# Patient Record
Sex: Female | Born: 1950 | Race: Black or African American | Hispanic: No | Marital: Single | Smoking: Never smoker
Health system: Southern US, Community
[De-identification: ages and names within clinical notes are randomized; demographics above are authoritative.]

## PROBLEM LIST (undated history)

## (undated) DIAGNOSIS — E0590 Thyrotoxicosis, unspecified without thyrotoxic crisis or storm: Secondary | ICD-10-CM

## (undated) DIAGNOSIS — I10 Essential (primary) hypertension: Secondary | ICD-10-CM

## (undated) DIAGNOSIS — C189 Malignant neoplasm of colon, unspecified: Secondary | ICD-10-CM

## (undated) DIAGNOSIS — D649 Anemia, unspecified: Secondary | ICD-10-CM

## (undated) DIAGNOSIS — Z5189 Encounter for other specified aftercare: Secondary | ICD-10-CM

## (undated) DIAGNOSIS — T8859XA Other complications of anesthesia, initial encounter: Secondary | ICD-10-CM

## (undated) DIAGNOSIS — E079 Disorder of thyroid, unspecified: Secondary | ICD-10-CM

## (undated) DIAGNOSIS — T4145XA Adverse effect of unspecified anesthetic, initial encounter: Secondary | ICD-10-CM

## (undated) HISTORY — DX: Essential (primary) hypertension: I10

## (undated) HISTORY — DX: Anemia, unspecified: D64.9

## (undated) HISTORY — DX: Encounter for other specified aftercare: Z51.89

## (undated) HISTORY — PX: OTHER SURGICAL HISTORY: SHX169

## (undated) HISTORY — PX: COLECTOMY: SHX59

## (undated) HISTORY — DX: Malignant neoplasm of colon, unspecified: C18.9

## (undated) HISTORY — DX: Disorder of thyroid, unspecified: E07.9

---

## 2010-02-12 ENCOUNTER — Ambulatory Visit: Payer: Self-pay | Admitting: Hematology and Oncology

## 2010-02-13 LAB — CBC WITH DIFFERENTIAL/PLATELET
BASO%: 1.1 % (ref 0.0–2.0)
Basophils Absolute: 0 10*3/uL (ref 0.0–0.1)
EOS%: 1 % (ref 0.0–7.0)
Eosinophils Absolute: 0 10*3/uL (ref 0.0–0.5)
HCT: 35.3 % (ref 34.8–46.6)
HGB: 11.7 g/dL (ref 11.6–15.9)
LYMPH%: 32.6 % (ref 14.0–49.7)
MCH: 24.9 pg — ABNORMAL LOW (ref 25.1–34.0)
MCHC: 33 g/dL (ref 31.5–36.0)
MCV: 75.4 fL — ABNORMAL LOW (ref 79.5–101.0)
MONO#: 0.5 10*3/uL (ref 0.1–0.9)
MONO%: 16.9 % — ABNORMAL HIGH (ref 0.0–14.0)
NEUT#: 1.5 10*3/uL (ref 1.5–6.5)
NEUT%: 48.4 % (ref 38.4–76.8)
Platelets: 334 10*3/uL (ref 145–400)
RBC: 4.68 10*6/uL (ref 3.70–5.45)
RDW: 24.5 % — ABNORMAL HIGH (ref 11.2–14.5)
WBC: 3.1 10*3/uL — ABNORMAL LOW (ref 3.9–10.3)
lymph#: 1 10*3/uL (ref 0.9–3.3)

## 2010-02-13 LAB — COMPREHENSIVE METABOLIC PANEL
ALT: 17 U/L (ref 0–35)
AST: 29 U/L (ref 0–37)
Albumin: 4.5 g/dL (ref 3.5–5.2)
Alkaline Phosphatase: 82 U/L (ref 39–117)
BUN: 13 mg/dL (ref 6–23)
CO2: 28 mEq/L (ref 19–32)
Calcium: 9.9 mg/dL (ref 8.4–10.5)
Chloride: 103 mEq/L (ref 96–112)
Creatinine, Ser: 0.52 mg/dL (ref 0.40–1.20)
Glucose, Bld: 78 mg/dL (ref 70–99)
Potassium: 3.9 mEq/L (ref 3.5–5.3)
Sodium: 140 mEq/L (ref 135–145)
Total Bilirubin: 0.4 mg/dL (ref 0.3–1.2)
Total Protein: 7.4 g/dL (ref 6.0–8.3)

## 2010-02-13 LAB — CEA: CEA: 0.6 ng/mL (ref 0.0–5.0)

## 2010-03-04 LAB — COMPREHENSIVE METABOLIC PANEL
ALT: 16 U/L (ref 0–35)
AST: 24 U/L (ref 0–37)
Albumin: 4 g/dL (ref 3.5–5.2)
Alkaline Phosphatase: 113 U/L (ref 39–117)
BUN: 11 mg/dL (ref 6–23)
Chloride: 105 mEq/L (ref 96–112)
Creatinine, Ser: 0.55 mg/dL (ref 0.40–1.20)
Potassium: 3.8 mEq/L (ref 3.5–5.3)

## 2010-03-04 LAB — CBC WITH DIFFERENTIAL/PLATELET
BASO%: 0.5 % (ref 0.0–2.0)
Basophils Absolute: 0 10*3/uL (ref 0.0–0.1)
EOS%: 1 % (ref 0.0–7.0)
HGB: 11.3 g/dL — ABNORMAL LOW (ref 11.6–15.9)
MCH: 25.9 pg (ref 25.1–34.0)
MCHC: 32.9 g/dL (ref 31.5–36.0)
MCV: 78.5 fL — ABNORMAL LOW (ref 79.5–101.0)
MONO%: 7.6 % (ref 0.0–14.0)
RDW: 23 % — ABNORMAL HIGH (ref 11.2–14.5)
lymph#: 1.4 10*3/uL (ref 0.9–3.3)

## 2010-03-16 ENCOUNTER — Ambulatory Visit: Payer: Self-pay | Admitting: Hematology and Oncology

## 2010-03-18 LAB — CBC WITH DIFFERENTIAL/PLATELET
BASO%: 1.4 % (ref 0.0–2.0)
Basophils Absolute: 0.1 10*3/uL (ref 0.0–0.1)
EOS%: 1.2 % (ref 0.0–7.0)
Eosinophils Absolute: 0.1 10*3/uL (ref 0.0–0.5)
HCT: 35.9 % (ref 34.8–46.6)
HGB: 11.9 g/dL (ref 11.6–15.9)
LYMPH%: 16.4 % (ref 14.0–49.7)
MCH: 26.8 pg (ref 25.1–34.0)
MCHC: 33.2 g/dL (ref 31.5–36.0)
MCV: 80.7 fL (ref 79.5–101.0)
MONO#: 0.7 10*3/uL (ref 0.1–0.9)
MONO%: 8.2 % (ref 0.0–14.0)
NEUT#: 6.2 10*3/uL (ref 1.5–6.5)
NEUT%: 72.8 % (ref 38.4–76.8)
Platelets: 281 10*3/uL (ref 145–400)
RBC: 4.45 10*6/uL (ref 3.70–5.45)
RDW: 22.9 % — ABNORMAL HIGH (ref 11.2–14.5)
WBC: 8.5 10*3/uL (ref 3.9–10.3)
lymph#: 1.4 10*3/uL (ref 0.9–3.3)

## 2010-03-18 LAB — COMPREHENSIVE METABOLIC PANEL
ALT: 19 U/L (ref 0–35)
AST: 27 U/L (ref 0–37)
Albumin: 4.2 g/dL (ref 3.5–5.2)
Alkaline Phosphatase: 165 U/L — ABNORMAL HIGH (ref 39–117)
BUN: 10 mg/dL (ref 6–23)
CO2: 28 mEq/L (ref 19–32)
Calcium: 9.7 mg/dL (ref 8.4–10.5)
Chloride: 99 mEq/L (ref 96–112)
Creatinine, Ser: 0.58 mg/dL (ref 0.40–1.20)
Glucose, Bld: 79 mg/dL (ref 70–99)
Potassium: 4 mEq/L (ref 3.5–5.3)
Sodium: 134 mEq/L — ABNORMAL LOW (ref 135–145)
Total Bilirubin: 0.3 mg/dL (ref 0.3–1.2)
Total Protein: 6.8 g/dL (ref 6.0–8.3)

## 2010-04-15 ENCOUNTER — Ambulatory Visit: Payer: Self-pay | Admitting: Hematology and Oncology

## 2010-04-16 LAB — COMPREHENSIVE METABOLIC PANEL
AST: 31 U/L (ref 0–37)
Alkaline Phosphatase: 169 U/L — ABNORMAL HIGH (ref 39–117)
BUN: 20 mg/dL (ref 6–23)
Calcium: 9.2 mg/dL (ref 8.4–10.5)
Creatinine, Ser: 0.54 mg/dL (ref 0.40–1.20)

## 2010-04-16 LAB — CBC WITH DIFFERENTIAL/PLATELET
Basophils Absolute: 0 10*3/uL (ref 0.0–0.1)
EOS%: 1.3 % (ref 0.0–7.0)
HCT: 34 % — ABNORMAL LOW (ref 34.8–46.6)
HGB: 11.3 g/dL — ABNORMAL LOW (ref 11.6–15.9)
MCH: 27.6 pg (ref 25.1–34.0)
MCV: 82.8 fL (ref 79.5–101.0)
MONO%: 11.8 % (ref 0.0–14.0)
NEUT%: 69.1 % (ref 38.4–76.8)

## 2010-04-30 LAB — COMPREHENSIVE METABOLIC PANEL
AST: 30 U/L (ref 0–37)
BUN: 13 mg/dL (ref 6–23)
Calcium: 9.1 mg/dL (ref 8.4–10.5)
Chloride: 102 mEq/L (ref 96–112)
Creatinine, Ser: 0.63 mg/dL (ref 0.40–1.20)
Glucose, Bld: 66 mg/dL — ABNORMAL LOW (ref 70–99)

## 2010-04-30 LAB — CBC WITH DIFFERENTIAL/PLATELET
Basophils Absolute: 0 10*3/uL (ref 0.0–0.1)
EOS%: 1.6 % (ref 0.0–7.0)
Eosinophils Absolute: 0.1 10*3/uL (ref 0.0–0.5)
HCT: 36.1 % (ref 34.8–46.6)
HGB: 11.9 g/dL (ref 11.6–15.9)
MCH: 27.3 pg (ref 25.1–34.0)
MCV: 82.8 fL (ref 79.5–101.0)
NEUT%: 65.9 % (ref 38.4–76.8)
lymph#: 1.9 10*3/uL (ref 0.9–3.3)

## 2010-05-13 LAB — CBC WITH DIFFERENTIAL/PLATELET
Basophils Absolute: 0.1 10*3/uL (ref 0.0–0.1)
Eosinophils Absolute: 0.1 10*3/uL (ref 0.0–0.5)
HCT: 32.6 % — ABNORMAL LOW (ref 34.8–46.6)
HGB: 11 g/dL — ABNORMAL LOW (ref 11.6–15.9)
MONO#: 1 10*3/uL — ABNORMAL HIGH (ref 0.1–0.9)
NEUT#: 8.1 10*3/uL — ABNORMAL HIGH (ref 1.5–6.5)
NEUT%: 73.7 % (ref 38.4–76.8)
RDW: 22.3 % — ABNORMAL HIGH (ref 11.2–14.5)
WBC: 11 10*3/uL — ABNORMAL HIGH (ref 3.9–10.3)
lymph#: 1.7 10*3/uL (ref 0.9–3.3)

## 2010-05-13 LAB — COMPREHENSIVE METABOLIC PANEL
Albumin: 4.1 g/dL (ref 3.5–5.2)
BUN: 10 mg/dL (ref 6–23)
CO2: 26 mEq/L (ref 19–32)
Calcium: 9.3 mg/dL (ref 8.4–10.5)
Chloride: 102 mEq/L (ref 96–112)
Creatinine, Ser: 0.54 mg/dL (ref 0.40–1.20)
Potassium: 3.7 mEq/L (ref 3.5–5.3)

## 2010-05-15 ENCOUNTER — Ambulatory Visit: Payer: Self-pay | Admitting: Hematology and Oncology

## 2010-05-25 LAB — COMPREHENSIVE METABOLIC PANEL
AST: 31 U/L (ref 0–37)
Alkaline Phosphatase: 105 U/L (ref 39–117)
BUN: 13 mg/dL (ref 6–23)
Creatinine, Ser: 0.53 mg/dL (ref 0.40–1.20)
Total Bilirubin: 0.3 mg/dL (ref 0.3–1.2)

## 2010-05-25 LAB — CBC WITH DIFFERENTIAL/PLATELET
BASO%: 0.9 % (ref 0.0–2.0)
Basophils Absolute: 0 10*3/uL (ref 0.0–0.1)
EOS%: 0.7 % (ref 0.0–7.0)
HCT: 32 % — ABNORMAL LOW (ref 34.8–46.6)
HGB: 10.9 g/dL — ABNORMAL LOW (ref 11.6–15.9)
MCH: 29.1 pg (ref 25.1–34.0)
MCHC: 34.1 g/dL (ref 31.5–36.0)
MCV: 85.4 fL (ref 79.5–101.0)
MONO%: 11 % (ref 0.0–14.0)
NEUT%: 55 % (ref 38.4–76.8)

## 2010-06-03 LAB — CBC WITH DIFFERENTIAL/PLATELET
Basophils Absolute: 0 10*3/uL (ref 0.0–0.1)
EOS%: 0.9 % (ref 0.0–7.0)
HCT: 35.4 % (ref 34.8–46.6)
HGB: 11.9 g/dL (ref 11.6–15.9)
LYMPH%: 11.4 % — ABNORMAL LOW (ref 14.0–49.7)
MCH: 28.7 pg (ref 25.1–34.0)
MCV: 85.7 fL (ref 79.5–101.0)
MONO%: 8.8 % (ref 0.0–14.0)
NEUT%: 78.6 % — ABNORMAL HIGH (ref 38.4–76.8)
Platelets: 114 10*3/uL — ABNORMAL LOW (ref 145–400)
lymph#: 1.2 10*3/uL (ref 0.9–3.3)

## 2010-06-03 LAB — COMPREHENSIVE METABOLIC PANEL
AST: 35 U/L (ref 0–37)
BUN: 6 mg/dL (ref 6–23)
Calcium: 9.1 mg/dL (ref 8.4–10.5)
Chloride: 104 mEq/L (ref 96–112)
Creatinine, Ser: 0.56 mg/dL (ref 0.40–1.20)

## 2010-06-11 LAB — CBC WITH DIFFERENTIAL/PLATELET
Basophils Absolute: 0.1 10*3/uL (ref 0.0–0.1)
HCT: 36.4 % (ref 34.8–46.6)
HGB: 12.4 g/dL (ref 11.6–15.9)
MCH: 29.4 pg (ref 25.1–34.0)
MONO#: 0.5 10*3/uL (ref 0.1–0.9)
NEUT%: 70.9 % (ref 38.4–76.8)
WBC: 8.7 10*3/uL (ref 3.9–10.3)
lymph#: 1.8 10*3/uL (ref 0.9–3.3)

## 2010-06-22 ENCOUNTER — Ambulatory Visit: Payer: Self-pay | Admitting: Hematology and Oncology

## 2010-06-24 LAB — CBC WITH DIFFERENTIAL/PLATELET
BASO%: 1.5 % (ref 0.0–2.0)
Basophils Absolute: 0.1 10*3/uL (ref 0.0–0.1)
EOS%: 1.3 % (ref 0.0–7.0)
Eosinophils Absolute: 0.1 10*3/uL (ref 0.0–0.5)
HCT: 35.9 % (ref 34.8–46.6)
HGB: 11.8 g/dL (ref 11.6–15.9)
LYMPH%: 33.6 % (ref 14.0–49.7)
MCH: 28.4 pg (ref 25.1–34.0)
MCHC: 33 g/dL (ref 31.5–36.0)
MCV: 86.2 fL (ref 79.5–101.0)
MONO#: 0.7 10*3/uL (ref 0.1–0.9)
MONO%: 16.3 % — ABNORMAL HIGH (ref 0.0–14.0)
NEUT#: 1.9 10*3/uL (ref 1.5–6.5)
NEUT%: 47.3 % (ref 38.4–76.8)
Platelets: 224 10*3/uL (ref 145–400)
RBC: 4.17 10*6/uL (ref 3.70–5.45)
RDW: 20.3 % — ABNORMAL HIGH (ref 11.2–14.5)
WBC: 4 10*3/uL (ref 3.9–10.3)
lymph#: 1.3 10*3/uL (ref 0.9–3.3)

## 2010-06-24 LAB — COMPREHENSIVE METABOLIC PANEL
ALT: 20 U/L (ref 0–35)
AST: 37 U/L (ref 0–37)
Albumin: 4.2 g/dL (ref 3.5–5.2)
Alkaline Phosphatase: 110 U/L (ref 39–117)
BUN: 11 mg/dL (ref 6–23)
CO2: 28 mEq/L (ref 19–32)
Calcium: 9.5 mg/dL (ref 8.4–10.5)
Chloride: 102 mEq/L (ref 96–112)
Creatinine, Ser: 0.58 mg/dL (ref 0.40–1.20)
Glucose, Bld: 82 mg/dL (ref 70–99)
Potassium: 4.8 mEq/L (ref 3.5–5.3)
Sodium: 138 mEq/L (ref 135–145)
Total Bilirubin: 0.3 mg/dL (ref 0.3–1.2)
Total Protein: 7.7 g/dL (ref 6.0–8.3)

## 2010-07-03 LAB — COMPREHENSIVE METABOLIC PANEL
ALT: 27 U/L (ref 0–35)
AST: 38 U/L — ABNORMAL HIGH (ref 0–37)
Albumin: 4.6 g/dL (ref 3.5–5.2)
Alkaline Phosphatase: 224 U/L — ABNORMAL HIGH (ref 39–117)
BUN: 10 mg/dL (ref 6–23)
CO2: 28 mEq/L (ref 19–32)
Calcium: 9.7 mg/dL (ref 8.4–10.5)
Chloride: 99 mEq/L (ref 96–112)
Creatinine, Ser: 0.53 mg/dL (ref 0.40–1.20)
Glucose, Bld: 86 mg/dL (ref 70–99)
Potassium: 3.2 mEq/L — ABNORMAL LOW (ref 3.5–5.3)
Sodium: 139 mEq/L (ref 135–145)
Total Bilirubin: 0.4 mg/dL (ref 0.3–1.2)
Total Protein: 8.1 g/dL (ref 6.0–8.3)

## 2010-07-03 LAB — CBC WITH DIFFERENTIAL/PLATELET
BASO%: 0.3 % (ref 0.0–2.0)
Basophils Absolute: 0 10*3/uL (ref 0.0–0.1)
EOS%: 1 % (ref 0.0–7.0)
Eosinophils Absolute: 0.1 10*3/uL (ref 0.0–0.5)
HCT: 38.4 % (ref 34.8–46.6)
HGB: 12.7 g/dL (ref 11.6–15.9)
LYMPH%: 15.5 % (ref 14.0–49.7)
MCH: 28.7 pg (ref 25.1–34.0)
MCHC: 33.1 g/dL (ref 31.5–36.0)
MCV: 86.8 fL (ref 79.5–101.0)
MONO#: 1.1 10*3/uL — ABNORMAL HIGH (ref 0.1–0.9)
MONO%: 7.8 % (ref 0.0–14.0)
NEUT#: 10.2 10*3/uL — ABNORMAL HIGH (ref 1.5–6.5)
NEUT%: 75.4 % (ref 38.4–76.8)
Platelets: 142 10*3/uL — ABNORMAL LOW (ref 145–400)
RBC: 4.42 10*6/uL (ref 3.70–5.45)
RDW: 19.9 % — ABNORMAL HIGH (ref 11.2–14.5)
WBC: 13.6 10*3/uL — ABNORMAL HIGH (ref 3.9–10.3)
lymph#: 2.1 10*3/uL (ref 0.9–3.3)

## 2010-07-03 LAB — CEA: CEA: 0.7 ng/mL (ref 0.0–5.0)

## 2010-07-06 ENCOUNTER — Ambulatory Visit (HOSPITAL_COMMUNITY)
Admission: RE | Admit: 2010-07-06 | Discharge: 2010-07-06 | Payer: Self-pay | Source: Home / Self Care | Attending: Hematology and Oncology | Admitting: Hematology and Oncology

## 2010-07-24 LAB — HM COLONOSCOPY: HM Colonoscopy: NORMAL

## 2010-08-06 ENCOUNTER — Encounter (HOSPITAL_BASED_OUTPATIENT_CLINIC_OR_DEPARTMENT_OTHER): Payer: Medicaid Other | Admitting: Hematology and Oncology

## 2010-08-06 DIAGNOSIS — C182 Malignant neoplasm of ascending colon: Secondary | ICD-10-CM

## 2010-08-06 DIAGNOSIS — Z452 Encounter for adjustment and management of vascular access device: Secondary | ICD-10-CM

## 2010-08-19 ENCOUNTER — Other Ambulatory Visit: Payer: Self-pay | Admitting: Emergency Medicine

## 2010-08-19 DIAGNOSIS — R102 Pelvic and perineal pain: Secondary | ICD-10-CM

## 2010-08-25 ENCOUNTER — Ambulatory Visit
Admission: RE | Admit: 2010-08-25 | Discharge: 2010-08-25 | Disposition: A | Discharge status: Home / Self Care | Payer: Medicare Other | Source: Ambulatory Visit | Attending: Emergency Medicine | Admitting: Emergency Medicine

## 2010-08-25 DIAGNOSIS — R102 Pelvic and perineal pain: Secondary | ICD-10-CM

## 2010-09-17 ENCOUNTER — Encounter (HOSPITAL_BASED_OUTPATIENT_CLINIC_OR_DEPARTMENT_OTHER): Payer: Medicare Other | Admitting: Hematology and Oncology

## 2010-09-17 DIAGNOSIS — Z452 Encounter for adjustment and management of vascular access device: Secondary | ICD-10-CM

## 2010-09-17 DIAGNOSIS — C182 Malignant neoplasm of ascending colon: Secondary | ICD-10-CM

## 2010-11-02 ENCOUNTER — Encounter (HOSPITAL_BASED_OUTPATIENT_CLINIC_OR_DEPARTMENT_OTHER): Payer: Medicare Other | Admitting: Hematology and Oncology

## 2010-11-02 ENCOUNTER — Other Ambulatory Visit: Payer: Self-pay | Admitting: Hematology and Oncology

## 2010-11-02 DIAGNOSIS — Z5111 Encounter for antineoplastic chemotherapy: Secondary | ICD-10-CM

## 2010-11-02 DIAGNOSIS — C182 Malignant neoplasm of ascending colon: Secondary | ICD-10-CM

## 2010-11-02 DIAGNOSIS — I10 Essential (primary) hypertension: Secondary | ICD-10-CM

## 2010-11-02 LAB — COMPREHENSIVE METABOLIC PANEL
ALT: 47 U/L — ABNORMAL HIGH (ref 0–35)
AST: 56 U/L — ABNORMAL HIGH (ref 0–37)
Albumin: 4.1 g/dL (ref 3.5–5.2)
CO2: 28 mEq/L (ref 19–32)
Calcium: 9.5 mg/dL (ref 8.4–10.5)
Chloride: 100 mEq/L (ref 96–112)
Creatinine, Ser: 0.69 mg/dL (ref 0.40–1.20)
Potassium: 4.2 mEq/L (ref 3.5–5.3)

## 2010-11-02 LAB — CBC WITH DIFFERENTIAL/PLATELET
BASO%: 0.6 % (ref 0.0–2.0)
Basophils Absolute: 0 10*3/uL (ref 0.0–0.1)
EOS%: 2.4 % (ref 0.0–7.0)
HCT: 36.5 % (ref 34.8–46.6)
HGB: 12.2 g/dL (ref 11.6–15.9)
MCH: 28.1 pg (ref 25.1–34.0)
MCHC: 33.5 g/dL (ref 31.5–36.0)
MONO#: 0.2 10*3/uL (ref 0.1–0.9)
NEUT%: 58.2 % (ref 38.4–76.8)
RDW: 14.7 % — ABNORMAL HIGH (ref 11.2–14.5)
WBC: 4 10*3/uL (ref 3.9–10.3)
lymph#: 1.3 10*3/uL (ref 0.9–3.3)

## 2010-11-02 LAB — CEA: CEA: <0.5 ng/mL (ref 0.0–5.0)

## 2010-11-05 ENCOUNTER — Encounter (HOSPITAL_BASED_OUTPATIENT_CLINIC_OR_DEPARTMENT_OTHER): Payer: Medicare Other | Admitting: Hematology and Oncology

## 2010-11-05 DIAGNOSIS — C182 Malignant neoplasm of ascending colon: Secondary | ICD-10-CM

## 2011-01-08 ENCOUNTER — Encounter (HOSPITAL_BASED_OUTPATIENT_CLINIC_OR_DEPARTMENT_OTHER): Payer: Medicare Other | Admitting: Hematology and Oncology

## 2011-01-08 DIAGNOSIS — Z452 Encounter for adjustment and management of vascular access device: Secondary | ICD-10-CM

## 2011-01-08 DIAGNOSIS — C182 Malignant neoplasm of ascending colon: Secondary | ICD-10-CM

## 2011-03-05 ENCOUNTER — Other Ambulatory Visit (HOSPITAL_COMMUNITY): Payer: Self-pay | Admitting: Oncology

## 2011-03-05 ENCOUNTER — Encounter (HOSPITAL_BASED_OUTPATIENT_CLINIC_OR_DEPARTMENT_OTHER): Payer: Medicare Other | Admitting: Hematology and Oncology

## 2011-03-05 DIAGNOSIS — C189 Malignant neoplasm of colon, unspecified: Secondary | ICD-10-CM

## 2011-03-05 DIAGNOSIS — C182 Malignant neoplasm of ascending colon: Secondary | ICD-10-CM

## 2011-03-05 DIAGNOSIS — Z452 Encounter for adjustment and management of vascular access device: Secondary | ICD-10-CM

## 2011-03-05 LAB — CBC WITH DIFFERENTIAL/PLATELET
BASO%: 0.3 % (ref 0.0–2.0)
Basophils Absolute: 0 10*3/uL (ref 0.0–0.1)
EOS%: 1 % (ref 0.0–7.0)
MCH: 28.7 pg (ref 25.1–34.0)
MCHC: 33.8 g/dL (ref 31.5–36.0)
MCV: 84.8 fL (ref 79.5–101.0)
MONO%: 6.2 % (ref 0.0–14.0)
RBC: 4.5 10*6/uL (ref 3.70–5.45)
RDW: 14.4 % (ref 11.2–14.5)
lymph#: 1.5 10*3/uL (ref 0.9–3.3)

## 2011-03-05 LAB — COMPREHENSIVE METABOLIC PANEL
ALT: 15 U/L (ref 0–35)
AST: 28 U/L (ref 0–37)
Albumin: 4.4 g/dL (ref 3.5–5.2)
Alkaline Phosphatase: 67 U/L (ref 39–117)
BUN: 11 mg/dL (ref 6–23)
Calcium: 9.1 mg/dL (ref 8.4–10.5)
Chloride: 102 mEq/L (ref 96–112)
Potassium: 3.5 mEq/L (ref 3.5–5.3)
Sodium: 142 mEq/L (ref 135–145)

## 2011-03-09 ENCOUNTER — Encounter (HOSPITAL_BASED_OUTPATIENT_CLINIC_OR_DEPARTMENT_OTHER): Payer: Medicare Other | Admitting: Hematology and Oncology

## 2011-03-09 DIAGNOSIS — Z9889 Other specified postprocedural states: Secondary | ICD-10-CM

## 2011-03-09 DIAGNOSIS — Z85038 Personal history of other malignant neoplasm of large intestine: Secondary | ICD-10-CM

## 2011-05-04 ENCOUNTER — Ambulatory Visit (HOSPITAL_BASED_OUTPATIENT_CLINIC_OR_DEPARTMENT_OTHER): Payer: Medicare Other

## 2011-05-04 DIAGNOSIS — C189 Malignant neoplasm of colon, unspecified: Secondary | ICD-10-CM

## 2011-05-04 DIAGNOSIS — Z452 Encounter for adjustment and management of vascular access device: Secondary | ICD-10-CM

## 2011-05-04 MED ORDER — HEPARIN SOD (PORK) LOCK FLUSH 100 UNIT/ML IV SOLN
500.0000 [IU] | Freq: Once | INTRAVENOUS | Status: AC
  Administered 2011-05-04: 500 [IU] via INTRAVENOUS
  Filled 2011-05-04: qty 5

## 2011-05-04 MED ORDER — SODIUM CHLORIDE 0.9 % IJ SOLN
10.0000 mL | INTRAMUSCULAR | Status: DC | PRN
  Administered 2011-05-04: 10 mL via INTRAVENOUS
  Filled 2011-05-04: qty 10

## 2011-06-12 ENCOUNTER — Telehealth: Payer: Self-pay | Admitting: Hematology and Oncology

## 2011-06-12 NOTE — Telephone Encounter (Signed)
S/w the pt and she is aware of her jan,feb 2013 appts

## 2011-06-21 ENCOUNTER — Telehealth: Payer: Self-pay | Admitting: Hematology and Oncology

## 2011-06-21 ENCOUNTER — Other Ambulatory Visit: Payer: Self-pay | Admitting: Hematology and Oncology

## 2011-06-21 DIAGNOSIS — C189 Malignant neoplasm of colon, unspecified: Secondary | ICD-10-CM

## 2011-06-21 NOTE — Telephone Encounter (Signed)
lmonvm adviisng the pt of her ct scan appt on 07/15/2011 along with the lab with instructions. Pt aware to pick up the oral contrast.

## 2011-07-12 ENCOUNTER — Encounter: Payer: Self-pay | Admitting: Nurse Practitioner

## 2011-07-12 ENCOUNTER — Telehealth: Payer: Self-pay | Admitting: Hematology and Oncology

## 2011-07-12 ENCOUNTER — Other Ambulatory Visit: Payer: Self-pay | Admitting: Nurse Practitioner

## 2011-07-12 ENCOUNTER — Telehealth: Payer: Self-pay | Admitting: Nurse Practitioner

## 2011-07-12 DIAGNOSIS — C189 Malignant neoplasm of colon, unspecified: Secondary | ICD-10-CM

## 2011-07-12 NOTE — Telephone Encounter (Signed)
Spoke with patient she will be coming in to speak with me on 07/13/2011 around 10 or 10:30

## 2011-07-12 NOTE — Telephone Encounter (Signed)
1. Pt called inquiring about contrast for CT scan on 1/31.  Instructed pt to pick up contrast from our scheduling department.  2. Pt also stated someone had called her about "filling out paperwork because I am on the Medicaid needy plan".  Noted no phone notes from this office however, informed patient I would forward her name to our financial advocates for assistance.  3. Pt requested portacath flush appt same day as lab and CT scan.  I offered to patient that she could have her port flushed when she saw MD on 07/20/11, however, she was insistent that she did not want to wait.  Portacath flush appt request forwarded to scheduling.

## 2011-07-13 ENCOUNTER — Telehealth: Payer: Self-pay | Admitting: Hematology and Oncology

## 2011-07-13 NOTE — Telephone Encounter (Signed)
Pt came and gave her calendar appt and oral contrast,NPO 4 hrs prior to scan

## 2011-07-15 ENCOUNTER — Ambulatory Visit (HOSPITAL_BASED_OUTPATIENT_CLINIC_OR_DEPARTMENT_OTHER): Payer: Medicare Other

## 2011-07-15 ENCOUNTER — Other Ambulatory Visit (HOSPITAL_BASED_OUTPATIENT_CLINIC_OR_DEPARTMENT_OTHER): Payer: Medicare Other | Admitting: Lab

## 2011-07-15 ENCOUNTER — Ambulatory Visit (HOSPITAL_COMMUNITY)
Admission: RE | Admit: 2011-07-15 | Discharge: 2011-07-15 | Disposition: A | Discharge status: Home / Self Care | Payer: Medicare Other | Source: Ambulatory Visit | Attending: Hematology and Oncology | Admitting: Hematology and Oncology

## 2011-07-15 ENCOUNTER — Encounter (HOSPITAL_COMMUNITY): Payer: Self-pay

## 2011-07-15 VITALS — BP 171/102 | HR 51 | Temp 97.1°F

## 2011-07-15 DIAGNOSIS — C189 Malignant neoplasm of colon, unspecified: Secondary | ICD-10-CM | POA: Diagnosis not present

## 2011-07-15 DIAGNOSIS — N838 Other noninflammatory disorders of ovary, fallopian tube and broad ligament: Secondary | ICD-10-CM | POA: Insufficient documentation

## 2011-07-15 DIAGNOSIS — R911 Solitary pulmonary nodule: Secondary | ICD-10-CM | POA: Insufficient documentation

## 2011-07-15 DIAGNOSIS — Z469 Encounter for fitting and adjustment of unspecified device: Secondary | ICD-10-CM

## 2011-07-15 DIAGNOSIS — E049 Nontoxic goiter, unspecified: Secondary | ICD-10-CM | POA: Diagnosis not present

## 2011-07-15 LAB — CMP (CANCER CENTER ONLY)
ALT(SGPT): 20 U/L (ref 10–47)
Albumin: 3.4 g/dL (ref 3.3–5.5)
CO2: 31 mEq/L (ref 18–33)
Chloride: 97 mEq/L — ABNORMAL LOW (ref 98–108)
Glucose, Bld: 96 mg/dL (ref 73–118)
Potassium: 3.8 mEq/L (ref 3.3–4.7)
Sodium: 141 mEq/L (ref 128–145)
Total Protein: 7.9 g/dL (ref 6.4–8.1)

## 2011-07-15 LAB — CBC WITH DIFFERENTIAL/PLATELET
Basophils Absolute: 0 10*3/uL (ref 0.0–0.1)
EOS%: 1.8 % (ref 0.0–7.0)
Eosinophils Absolute: 0.1 10*3/uL (ref 0.0–0.5)
LYMPH%: 35.7 % (ref 14.0–49.7)
MCH: 28.6 pg (ref 25.1–34.0)
MCV: 85.2 fL (ref 79.5–101.0)
MONO%: 7.2 % (ref 0.0–14.0)
NEUT#: 2.4 10*3/uL (ref 1.5–6.5)
Platelets: 266 10*3/uL (ref 145–400)
RBC: 4.59 10*6/uL (ref 3.70–5.45)

## 2011-07-15 LAB — CEA: CEA: 0.6 ng/mL (ref 0.0–5.0)

## 2011-07-15 MED ORDER — IOHEXOL 300 MG/ML  SOLN
100.0000 mL | Freq: Once | INTRAMUSCULAR | Status: AC | PRN
  Administered 2011-07-15: 100 mL via INTRAVENOUS

## 2011-07-15 MED ORDER — HEPARIN SOD (PORK) LOCK FLUSH 100 UNIT/ML IV SOLN
500.0000 [IU] | Freq: Once | INTRAVENOUS | Status: AC | PRN
  Administered 2011-07-15: 500 [IU] via INTRAVENOUS
  Filled 2011-07-15: qty 5

## 2011-07-15 MED ORDER — SODIUM CHLORIDE 0.9 % IJ SOLN
10.0000 mL | INTRAMUSCULAR | Status: DC | PRN
  Administered 2011-07-15: 10 mL via INTRAVENOUS
  Filled 2011-07-15: qty 10

## 2011-07-20 ENCOUNTER — Ambulatory Visit (HOSPITAL_BASED_OUTPATIENT_CLINIC_OR_DEPARTMENT_OTHER): Payer: Medicare Other | Admitting: Hematology and Oncology

## 2011-07-20 VITALS — BP 131/84 | HR 49 | Temp 97.0°F | Ht 67.0 in | Wt 159.7 lb

## 2011-07-20 DIAGNOSIS — I10 Essential (primary) hypertension: Secondary | ICD-10-CM

## 2011-07-20 DIAGNOSIS — C182 Malignant neoplasm of ascending colon: Secondary | ICD-10-CM | POA: Diagnosis not present

## 2011-07-20 DIAGNOSIS — C189 Malignant neoplasm of colon, unspecified: Secondary | ICD-10-CM

## 2011-07-20 DIAGNOSIS — E079 Disorder of thyroid, unspecified: Secondary | ICD-10-CM

## 2011-07-20 MED ORDER — HEPARIN SOD (PORK) LOCK FLUSH 100 UNIT/ML IV SOLN
500.0000 [IU] | Freq: Once | INTRAVENOUS | Status: DC
  Filled 2011-07-20: qty 5

## 2011-07-20 MED ORDER — ALTEPLASE 2 MG IJ SOLR
2.0000 mg | Freq: Once | INTRAMUSCULAR | Status: DC | PRN
  Filled 2011-07-20: qty 2

## 2011-07-20 MED ORDER — SODIUM CHLORIDE 0.9 % IJ SOLN
10.0000 mL | INTRAMUSCULAR | Status: DC | PRN
  Filled 2011-07-20: qty 10

## 2011-07-20 NOTE — Progress Notes (Signed)
CC:   Stan Head. Cleta Alberts, M.D.  IDENTIFYING STATEMENT:  Patient is a 61 year old woman with colon cancer who presents for followup.  INTERVAL HISTORY:  The patient reports that since her last followup visit she has had no ongoing concerns.  She has not lost any weight. She is not short of breath.  Her weight is stable.  She has not noted any change in bowel function.  We reviewed recent CT scan of the chest, abdomen, and pelvis obtained on 07/15/2011.  The chest CT did not show any new masses or pathologically enlarged mediastinal hilar lymph nodes. However, there was a new 4 mm subpleural nodule in the posterior aspect of the left upper lobe abutting the major fissure.  The neck showed a mixed cystic and solid appearing nodule in the right lobe of the gland. The patient is known to have a history of goiter.  The abdomen and pelvis showed no evidence of metastases.  There was no definitive evidence to suggest osseous metastases.  MEDICATIONS:  Reviewed and updated.  ALLERGIES:  Penicillin.  PAST MEDICAL HISTORY/FAMILY HISTORY/SOCIAL HISTORY:  Unchanged.  REVIEW OF SYSTEMS:  A 10 point review of systems negative.  PHYSICAL EXAM:  The patient is a well-appearing, well-nourished woman in no distress.  Vitals:  Pulse 49, blood pressure 131/84, temperature 97, respirations 20, weight 149 pounds.  HEENT:  Head is atraumatic, normocephalic.  Sclerae anicteric.  Mouth moist.  Neck:  Supple.  Chest: Clear to percussion and auscultation.  CVS:  1st and 2nd heart sounds present.  No added sounds or murmurs.  Port:  No signs of infection. Abdomen:  Soft, nontender.  No masses.  No hepatosplenomegaly.  Bowel sounds present.  Extremities:  No edema.  No calf tenderness.  Pulses present and symmetrical.  Lymph nodes:  No adenopathy.  CNS:  Nonfocal.  LAB DATA:  07/15/2011 white cell count 4.4, hemoglobin 13.1, hematocrit 39, platelets 266.  Sodium 141, potassium 3.8, chloride 97, CO2 31, BUN 14,  creatinine 0.7, glucose 96, t bilirubin 0.5, alkaline phosphatase 17, AST 28, ALT 20, calcium 8.8.  CEA 0.6.  Results of CTs are as in the interval history.  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  She completed 12 cycles of FOLFOX 6 from 11/18/2009 through 11/24/2010.  Her last colonoscopy in February 2012 was unremarkable.  Her current exam, specifically a CT chest notes a new subpleural nodule which is felt to be a lymph node.  There is no other evidence of metastatic disease.  CEA is not elevated. I do think this warrants close follow up with another scan in 3 months' time.     ______________________________ Laurice Record, M.D. LIO/MEDQ  D:  07/20/2011  T:  07/20/2011  Job:  [id number]

## 2011-07-20 NOTE — Progress Notes (Signed)
This office note has been dictated.

## 2011-07-21 ENCOUNTER — Telehealth: Payer: Self-pay

## 2011-07-21 NOTE — Telephone Encounter (Signed)
.  [facility]  DR DAUB Pt has a scan done and they saw something on her lung.  As a cancer survivor she does not wish to wait until May to have a follow up.  She hopes you can get her seen earlier. Please call pt.

## 2011-07-22 ENCOUNTER — Telehealth: Payer: Self-pay | Admitting: *Deleted

## 2011-07-22 ENCOUNTER — Encounter: Payer: Self-pay | Admitting: *Deleted

## 2011-07-22 NOTE — Telephone Encounter (Signed)
I called Debra Huber spoke with her regarding the abnormal area on her CT. The areas only 4 mm in size and too small to delineate at the present time and she will have to have a repeat scan in the next 3-6 months to see if the area is enlarging. Area seen on her thyroid advised her to contact Dr. Marissa Calamity who is her endocrinologist and get advice from her regarding her further management of this problem the she is in agreement with this and will follow up with Dr. Juliet Rude regarding this and have her followup CT scan of her chest as recommended by her oncologist to

## 2011-07-22 NOTE — Telephone Encounter (Signed)
I have reviewed the CT scan on this I will call her specifically and address the issue is the thyroid and the lesion seen on her diaphragm.

## 2011-07-22 NOTE — Telephone Encounter (Signed)
Received call from pt asking Dr. Dalene Carrow to inform  Dr. Adrian Prince re: pt has enlarged thyroid.   Pt stated she is taking  Methinazole 10 mg daily. Pt  Phone     [phone].

## 2011-07-29 ENCOUNTER — Telehealth: Payer: Self-pay

## 2011-07-29 NOTE — Telephone Encounter (Signed)
.  [facility] PT WOULD LIKE TO SPEAK WITH SOMEONE REGARDING HER DIAGNOSIS PLEASE CALL [phone]

## 2011-07-29 NOTE — Telephone Encounter (Signed)
CALLED PT AND SHE IS HAVING AN ISSUE WITH GETTING A RETURN CALL FROM DR SOUTH (ENDO) THAT DR DAUB REFERRED HER TO. SHE HAS CALLED THEM BECAUSE HER SCAN SHOWED SHE HAD AN ENLARGED THYROID AND SHE IS CONCERNED SINCE SHE HAS HAD CANCER IN THE PAST. SHE WANTS TO KNOW WHAT SHE SHOULD DO NOW AS FAR AS HER MEDS. SHOULD DHE CONTINUE TAKING HALF A PILL OF HER THYROID MED OR TAKE A WHOLE? PT IS CONFUSED AND AI UNABLE TO GET IN CONTACT WITH DR Evlyn Kanner. CAN YOU REFER HER TO SOMEONE ELSE OR MAYBE SEE IF YOU CAN GET IN CONTACT WITH HIM? PLEASE ADVISE

## 2011-07-30 NOTE — Telephone Encounter (Signed)
Please call  you on to have her make an appointment to see me at 104. We can review her scan at that time and make a decision about the next step.

## 2011-07-31 ENCOUNTER — Telehealth: Payer: Self-pay

## 2011-07-31 NOTE — Telephone Encounter (Signed)
Spoke with pt and advised the message from Dr Cleta Alberts. She has appt in March but will call Monday to see if she could get in sooner

## 2011-07-31 NOTE — Telephone Encounter (Signed)
Patient returning clinical TL call.

## 2011-07-31 NOTE — Telephone Encounter (Signed)
Called pt LMOM to CB. 

## 2011-08-24 ENCOUNTER — Ambulatory Visit: Payer: Self-pay | Admitting: Emergency Medicine

## 2011-09-01 ENCOUNTER — Other Ambulatory Visit: Payer: Self-pay | Admitting: Emergency Medicine

## 2011-09-03 ENCOUNTER — Encounter: Payer: Self-pay | Admitting: *Deleted

## 2011-09-03 ENCOUNTER — Telehealth: Payer: Self-pay | Admitting: Hematology and Oncology

## 2011-09-14 ENCOUNTER — Ambulatory Visit (INDEPENDENT_AMBULATORY_CARE_PROVIDER_SITE_OTHER): Payer: Medicare Other | Admitting: Emergency Medicine

## 2011-09-14 ENCOUNTER — Encounter: Payer: Self-pay | Admitting: Emergency Medicine

## 2011-09-14 VITALS — BP 104/93 | HR 47 | Temp 97.0°F | Resp 16 | Ht 67.0 in | Wt 159.2 lb

## 2011-09-14 DIAGNOSIS — E041 Nontoxic single thyroid nodule: Secondary | ICD-10-CM

## 2011-09-14 DIAGNOSIS — C189 Malignant neoplasm of colon, unspecified: Secondary | ICD-10-CM | POA: Diagnosis not present

## 2011-09-14 DIAGNOSIS — E0590 Thyrotoxicosis, unspecified without thyrotoxic crisis or storm: Secondary | ICD-10-CM

## 2011-09-14 NOTE — Progress Notes (Signed)
  Subjective:    Patient ID: Debra Huber, female    DOB: Dec 22, 1950, 61 y.o.   MRN: [id number]  HPI patient comes in concerned about a nodule that was found on CT. She was having a CT done as surveillance for her diagnosis of colon cancer. She has a history of hyperthyroidism and is currently on methimazole but has been prescribed by Dr Evlyn Kanner..     Review of Systems she has no evidence of colon cancer recurrence by CT. She is still having frequent close followups.     Objective:   Physical Exam  Physical exam reveals a nodular area present on the right side of the thyroid which is slightly enlarged .      Assessment & Plan:   Patient is a thyroid nodule and carries a diagnosis of hyperthyroidism currently on methimazole suppression. She had been followed for this by Dr. Evlyn Kanner. We'll go ahead and set up a thyroid ultrasound followed by biopsy by interventional radiology.

## 2011-09-15 ENCOUNTER — Other Ambulatory Visit: Payer: Self-pay | Admitting: Emergency Medicine

## 2011-09-15 ENCOUNTER — Other Ambulatory Visit: Payer: Self-pay | Admitting: Physician Assistant

## 2011-09-15 DIAGNOSIS — E079 Disorder of thyroid, unspecified: Secondary | ICD-10-CM

## 2011-09-15 DIAGNOSIS — E041 Nontoxic single thyroid nodule: Secondary | ICD-10-CM

## 2011-09-22 ENCOUNTER — Other Ambulatory Visit (HOSPITAL_COMMUNITY)
Admission: RE | Admit: 2011-09-22 | Discharge: 2011-09-22 | Disposition: A | Discharge status: Home / Self Care | Payer: Medicare Other | Source: Ambulatory Visit | Attending: Interventional Radiology | Admitting: Interventional Radiology

## 2011-09-22 ENCOUNTER — Ambulatory Visit
Admission: RE | Admit: 2011-09-22 | Discharge: 2011-09-22 | Disposition: A | Discharge status: Home / Self Care | Payer: Medicare Other | Source: Ambulatory Visit | Attending: Physician Assistant | Admitting: Physician Assistant

## 2011-09-22 ENCOUNTER — Ambulatory Visit
Admission: RE | Admit: 2011-09-22 | Discharge: 2011-09-22 | Disposition: A | Discharge status: Home / Self Care | Payer: Medicare Other | Source: Ambulatory Visit | Attending: Emergency Medicine | Admitting: Emergency Medicine

## 2011-09-22 DIAGNOSIS — E049 Nontoxic goiter, unspecified: Secondary | ICD-10-CM | POA: Insufficient documentation

## 2011-09-22 DIAGNOSIS — E042 Nontoxic multinodular goiter: Secondary | ICD-10-CM | POA: Diagnosis not present

## 2011-09-22 DIAGNOSIS — E041 Nontoxic single thyroid nodule: Secondary | ICD-10-CM

## 2011-10-01 ENCOUNTER — Telehealth: Payer: Self-pay

## 2011-10-01 NOTE — Telephone Encounter (Signed)
Pt returning Dr Cleta Alberts phone call.

## 2011-10-03 NOTE — Telephone Encounter (Signed)
LMOM we called about biopsy results. Negative findings

## 2011-10-05 ENCOUNTER — Ambulatory Visit: Payer: Medicare Other | Admitting: Emergency Medicine

## 2011-10-07 ENCOUNTER — Ambulatory Visit (HOSPITAL_BASED_OUTPATIENT_CLINIC_OR_DEPARTMENT_OTHER): Payer: Medicare Other

## 2011-10-07 VITALS — BP 143/98 | HR 47 | Temp 97.6°F

## 2011-10-07 DIAGNOSIS — Z452 Encounter for adjustment and management of vascular access device: Secondary | ICD-10-CM | POA: Diagnosis not present

## 2011-10-07 DIAGNOSIS — C182 Malignant neoplasm of ascending colon: Secondary | ICD-10-CM | POA: Diagnosis not present

## 2011-10-07 DIAGNOSIS — C189 Malignant neoplasm of colon, unspecified: Secondary | ICD-10-CM

## 2011-10-07 MED ORDER — HEPARIN SOD (PORK) LOCK FLUSH 100 UNIT/ML IV SOLN
500.0000 [IU] | Freq: Once | INTRAVENOUS | Status: AC | PRN
  Administered 2011-10-07: 500 [IU] via INTRAVENOUS
  Filled 2011-10-07: qty 5

## 2011-10-07 MED ORDER — SODIUM CHLORIDE 0.9 % IJ SOLN
10.0000 mL | INTRAMUSCULAR | Status: DC | PRN
  Administered 2011-10-07: 10 mL via INTRAVENOUS
  Filled 2011-10-07: qty 10

## 2011-10-19 ENCOUNTER — Telehealth: Payer: Self-pay | Admitting: *Deleted

## 2011-10-19 ENCOUNTER — Other Ambulatory Visit (HOSPITAL_COMMUNITY): Payer: Medicare Other

## 2011-10-19 ENCOUNTER — Other Ambulatory Visit (HOSPITAL_BASED_OUTPATIENT_CLINIC_OR_DEPARTMENT_OTHER): Payer: Medicare Other | Admitting: Lab

## 2011-10-19 NOTE — Telephone Encounter (Signed)
Spoke with pt and gave pt phone number to CT scan dept to reschedule missed appt today.   Instructed pt to have scans done before f/u appt on 10/27/11.    Pt voiced understanding and stated she would call radiology now.

## 2011-10-19 NOTE — Telephone Encounter (Signed)
Received message from [facility] in radiology re:  Pt  FTKA for CT scans today.   Called pt at home and left message on voice mail for pt to return nurse call for rescheduling scans.

## 2011-10-20 ENCOUNTER — Other Ambulatory Visit: Payer: Medicare Other | Admitting: Lab

## 2011-10-20 ENCOUNTER — Encounter (HOSPITAL_COMMUNITY): Payer: Self-pay

## 2011-10-20 ENCOUNTER — Other Ambulatory Visit (HOSPITAL_COMMUNITY): Payer: Medicare Other

## 2011-10-20 ENCOUNTER — Ambulatory Visit (HOSPITAL_COMMUNITY)
Admission: RE | Admit: 2011-10-20 | Discharge: 2011-10-20 | Disposition: A | Discharge status: Home / Self Care | Payer: Medicare Other | Source: Ambulatory Visit | Attending: Hematology and Oncology | Admitting: Hematology and Oncology

## 2011-10-20 DIAGNOSIS — C189 Malignant neoplasm of colon, unspecified: Secondary | ICD-10-CM | POA: Diagnosis not present

## 2011-10-20 DIAGNOSIS — C182 Malignant neoplasm of ascending colon: Secondary | ICD-10-CM

## 2011-10-20 DIAGNOSIS — E041 Nontoxic single thyroid nodule: Secondary | ICD-10-CM | POA: Diagnosis not present

## 2011-10-20 DIAGNOSIS — R911 Solitary pulmonary nodule: Secondary | ICD-10-CM | POA: Diagnosis not present

## 2011-10-20 DIAGNOSIS — R918 Other nonspecific abnormal finding of lung field: Secondary | ICD-10-CM | POA: Diagnosis not present

## 2011-10-20 DIAGNOSIS — J984 Other disorders of lung: Secondary | ICD-10-CM | POA: Diagnosis not present

## 2011-10-20 LAB — CBC WITH DIFFERENTIAL/PLATELET
BASO%: 0.2 % (ref 0.0–2.0)
Eosinophils Absolute: 0.1 10*3/uL (ref 0.0–0.5)
LYMPH%: 33.7 % (ref 14.0–49.7)
MCHC: 32.8 g/dL (ref 31.5–36.0)
MONO#: 0.2 10*3/uL (ref 0.1–0.9)
NEUT#: 2.9 10*3/uL (ref 1.5–6.5)
RBC: 4.43 10*6/uL (ref 3.70–5.45)
RDW: 14.3 % (ref 11.2–14.5)
WBC: 4.8 10*3/uL (ref 3.9–10.3)
lymph#: 1.6 10*3/uL (ref 0.9–3.3)

## 2011-10-20 LAB — CMP (CANCER CENTER ONLY)
ALT(SGPT): 22 U/L (ref 10–47)
AST: 27 U/L (ref 11–38)
CO2: 30 mEq/L (ref 18–33)
Calcium: 9 mg/dL (ref 8.0–10.3)
Chloride: 93 mEq/L — ABNORMAL LOW (ref 98–108)
Creat: 0.7 mg/dl (ref 0.6–1.2)
Potassium: 3.7 mEq/L (ref 3.3–4.7)
Sodium: 138 mEq/L (ref 128–145)
Total Protein: 6.7 g/dL (ref 6.4–8.1)

## 2011-10-20 LAB — CEA: CEA: <0.5 ng/mL (ref 0.0–5.0)

## 2011-10-20 MED ORDER — IOHEXOL 300 MG/ML  SOLN
80.0000 mL | Freq: Once | INTRAMUSCULAR | Status: AC | PRN
  Administered 2011-10-20: 80 mL via INTRAVENOUS

## 2011-10-26 ENCOUNTER — Other Ambulatory Visit: Payer: Self-pay | Admitting: *Deleted

## 2011-10-27 ENCOUNTER — Ambulatory Visit (HOSPITAL_BASED_OUTPATIENT_CLINIC_OR_DEPARTMENT_OTHER): Payer: Medicare Other | Admitting: Hematology and Oncology

## 2011-10-27 ENCOUNTER — Encounter: Payer: Self-pay | Admitting: Hematology and Oncology

## 2011-10-27 VITALS — BP 221/119 | HR 56 | Temp 97.0°F | Ht 67.0 in | Wt 159.4 lb

## 2011-10-27 DIAGNOSIS — C189 Malignant neoplasm of colon, unspecified: Secondary | ICD-10-CM | POA: Diagnosis not present

## 2011-10-27 NOTE — Progress Notes (Signed)
CC:   Stan Head. Cleta Alberts, M.D.  IDENTIFYING STATEMENT:  The patient is a 61 year old woman with colon cancer who presents for followup.  INTERVAL HISTORY:  The patient was seen 3 months ago at which time a CT scan and noted a subpleural nodule that required followup.  She has had no concerns.  She denies weight loss, nausea, vomiting, abdominal pain, diarrhea, and hematochezia.  Her last colonoscopy February 2012 was unremarkable.  She received a CT of the chest on 10/20/2011 that showed that the pulmonary nodules seen within both lungs had remained stable and the subpleural nodule that was described as new on exam 07/15/2011 was not seen on today's study.  There was no adenopathy.  MEDICATIONS:  Reviewed and updated.  ALLERGIES:  Penicillin.  PAST MEDICAL HISTORY/FAMILY HISTORY/SOCIAL HISTORY:  Unchanged.  REVIEW OF SYSTEMS:  Ten point review of systems negative.  PHYSICAL EXAM:  The patient is a well-appearing, well-nourished, woman in no distress.  Vitals:  Pulse 56, blood pressure 221/119, temperature 97, respirations 20, weight 159 pounds.  HEENT:  Head is atraumatic, normocephalic.  Sclerae anicteric.  Mouth moist.  Chest:  Clear. Cardiovascular:  Unremarkable.  Abdomen:  Soft, nontender.  Bowel sounds present.  Extremities:  No edema or calf tenderness.  Port:  No signs of infection.  Lymph nodes:  No adenopathy.  CNS:  Nonfocal.  LAB DATA:  10/19/2011 white cell count 4.8, hemoglobin 12.12, hematocrit 36.6, platelets 287.  Sodium 138, potassium 3.7, chloride 93, CO2 13, BUN 16, creatinine 0.7, glucose 96.  T bilirubin 0.5, alkaline phosphatase 70, AST 27, ALT 22, calcium 9.  CEA less than 0.5.  Results of chest CT as in interval history.  IMPRESSION/PLAN:  Ms. Capasso is a 61 year old woman who is status post right hemicolectomy on 11/17/2009 for stage III moderately differentiated adenocarcinoma of the colon with 1 of 26 lymph nodes sampled being positive for tumor.  She  completed 12 cycles of FOLFOX 6 from 11/18/2009 through 11/24/2010.  Her last colonoscopy in February 2012 was unremarkable.  The patient's recent CT notes no evidence of recurrence.  His CEA level is normal.  With this said, she follows up in 6 months' time with labs.  She would like to have her port removed.  This was placed in [place].  We will have Interventional Radiology assist the patient.    ______________________________ Laurice Record, M.D. LIO/MEDQ  D:  10/27/2011  T:  10/27/2011  Job:  [id number]

## 2011-10-27 NOTE — Progress Notes (Signed)
This office note has been dictated.

## 2011-10-27 NOTE — Patient Instructions (Signed)
Debra Huber  [id number]  [facility] Discharge Instructions  RECOMMENDATIONS MADE BY THE CONSULTANT AND ANY TEST RESULTS WILL BE SENT TO YOUR REFERRING DOCTOR.   EXAM FINDINGS BY MD TODAY AND SIGNS AND SYMPTOMS TO REPORT TO CLINIC OR PRIMARY MD:   Your current list of medications are: Current Outpatient Prescriptions  Medication Sig Dispense Refill  . amitriptyline (ELAVIL) 25 MG tablet Take 25 mg by mouth at bedtime.      Marland Kitchen aspirin EC 81 MG tablet Take 81 mg by mouth as needed.      . cloNIDine (CATAPRES) 0.3 MG tablet Take 0.3 mg by mouth 2 (two) times daily.      . Coenzyme Q10 (COQ10 PO) Take by mouth daily.      . hydrochlorothiazide (HYDRODIURIL) 25 MG tablet Take 25 mg by mouth daily.      Marland Kitchen levothyroxine (SYNTHROID) 25 MCG tablet Take 25 mcg by mouth daily.      Marland Kitchen lisinopril (PRINIVIL,ZESTRIL) 20 MG tablet Take 20 mg by mouth daily.      . methimazole (TAPAZOLE) 10 MG tablet Take 10 mg by mouth daily.      . Pediatric Multiple Vitamins (CHEWABLE MULTIPLE VITAMINS PO) Take by mouth daily.      . vitamin E (VITAMIN E) 1000 UNIT capsule Take 1,000 Units by mouth daily.      . metoprolol succinate (TOPROL-XL) 25 MG 24 hr tablet Take 25 mg by mouth daily.         INSTRUCTIONS GIVEN AND DISCUSSED:   SPECIAL INSTRUCTIONS/FOLLOW-UP:  See above.  I acknowledge that I have been informed and understand all the instructions given to me and received a copy. I do not have any more questions at this time, but understand that I may call the [facility] at [phone] during business hours should I have any further questions or need assistance in obtaining follow-up care.

## 2011-10-28 ENCOUNTER — Other Ambulatory Visit: Payer: Self-pay | Admitting: *Deleted

## 2011-10-28 ENCOUNTER — Telehealth: Payer: Self-pay | Admitting: *Deleted

## 2011-10-28 NOTE — Telephone Encounter (Signed)
spoke with Inetta Fermo in IR she stated that she is awaiting on the desk nurse to call her back because the patient is thinking that md wanted her to keep the port in until 04-2012

## 2011-11-01 ENCOUNTER — Other Ambulatory Visit: Payer: Self-pay | Admitting: Physician Assistant

## 2011-11-03 ENCOUNTER — Telehealth (HOSPITAL_COMMUNITY): Payer: Self-pay | Admitting: Hematology and Oncology

## 2011-11-03 ENCOUNTER — Other Ambulatory Visit: Payer: Self-pay | Admitting: Physician Assistant

## 2011-11-04 ENCOUNTER — Other Ambulatory Visit (HOSPITAL_COMMUNITY): Payer: Medicare Other

## 2011-11-04 ENCOUNTER — Ambulatory Visit (HOSPITAL_COMMUNITY)
Admission: RE | Admit: 2011-11-04 | Discharge: 2011-11-04 | Disposition: A | Discharge status: Home / Self Care | Payer: Medicare Other | Source: Ambulatory Visit | Attending: Hematology and Oncology | Admitting: Hematology and Oncology

## 2011-11-04 DIAGNOSIS — I498 Other specified cardiac arrhythmias: Secondary | ICD-10-CM | POA: Insufficient documentation

## 2011-11-04 DIAGNOSIS — Z85038 Personal history of other malignant neoplasm of large intestine: Secondary | ICD-10-CM | POA: Diagnosis not present

## 2011-11-04 DIAGNOSIS — Z79899 Other long term (current) drug therapy: Secondary | ICD-10-CM | POA: Diagnosis not present

## 2011-11-04 DIAGNOSIS — Z452 Encounter for adjustment and management of vascular access device: Secondary | ICD-10-CM | POA: Diagnosis not present

## 2011-11-04 DIAGNOSIS — C189 Malignant neoplasm of colon, unspecified: Secondary | ICD-10-CM | POA: Insufficient documentation

## 2011-11-04 DIAGNOSIS — I10 Essential (primary) hypertension: Secondary | ICD-10-CM | POA: Diagnosis not present

## 2011-11-04 HISTORY — PX: PORT-A-CATH REMOVAL: SHX5289

## 2011-11-04 LAB — PROTIME-INR: INR: 0.97 (ref 0.00–1.49)

## 2011-11-04 LAB — CBC
HCT: 41.5 % (ref 36.0–46.0)
MCHC: 33.3 g/dL (ref 30.0–36.0)
MCV: 83.8 fL (ref 78.0–100.0)
Platelets: 318 10*3/uL (ref 150–400)
RDW: 14.2 % (ref 11.5–15.5)

## 2011-11-04 MED ORDER — SODIUM CHLORIDE 0.9 % IV SOLN
INTRAVENOUS | Status: DC
  Administered 2011-11-04: 09:00:00 via INTRAVENOUS

## 2011-11-04 MED ORDER — VANCOMYCIN HCL IN DEXTROSE 1-5 GM/200ML-% IV SOLN
1000.0000 mg | Freq: Once | INTRAVENOUS | Status: AC
  Administered 2011-11-04: 1000 mg via INTRAVENOUS

## 2011-11-04 MED ORDER — LIDOCAINE HCL 1 % IJ SOLN
INTRAMUSCULAR | Status: AC
  Filled 2011-11-04: qty 20

## 2011-11-04 MED ORDER — FENTANYL CITRATE 0.05 MG/ML IJ SOLN
INTRAMUSCULAR | Status: AC
  Filled 2011-11-04: qty 4

## 2011-11-04 MED ORDER — MIDAZOLAM HCL 2 MG/2ML IJ SOLN
INTRAMUSCULAR | Status: AC
  Filled 2011-11-04: qty 4

## 2011-11-04 MED ORDER — VANCOMYCIN HCL IN DEXTROSE 1-5 GM/200ML-% IV SOLN
INTRAVENOUS | Status: AC
  Filled 2011-11-04: qty 200

## 2011-11-04 MED ORDER — MIDAZOLAM HCL 5 MG/5ML IJ SOLN
INTRAMUSCULAR | Status: AC | PRN
  Administered 2011-11-04 (×2): 2 mg via INTRAVENOUS

## 2011-11-04 MED ORDER — FENTANYL CITRATE 0.05 MG/ML IJ SOLN
INTRAMUSCULAR | Status: AC | PRN
  Administered 2011-11-04: 100 ug via INTRAVENOUS

## 2011-11-04 NOTE — H&P (Signed)
Debra Huber is an 61 y.o. female.   Chief Complaint: "I'm here for my port removal" HPI: Patient with history of colon carcinoma and completion of chemotherapy presents today for port a cath removal.  Past Medical History  Diagnosis Date  . Anemia   . Hypertension   . Thyroid disease   . Colon cancer dx'd 11/2009    Past Surgical History  Procedure Date  . Colectomy     partial    No family history on file. Social History:  reports that she has never smoked. She does not have any smokeless tobacco history on file. She reports that she does not drink alcohol. Her drug history not on file.  Allergies:  Allergies  Allergen Reactions  . Penicillins Swelling   Current outpatient prescriptions:amitriptyline (ELAVIL) 25 MG tablet, Take 25 mg by mouth at bedtime., Disp: , Rfl: ;  aspirin EC 81 MG tablet, Take 81 mg by mouth as needed., Disp: , Rfl: ;  cloNIDine (CATAPRES) 0.3 MG tablet, Take 0.3 mg by mouth 2 (two) times daily., Disp: , Rfl: ;  Coenzyme Q10 (COQ10 PO), Take by mouth daily., Disp: , Rfl: ;  hydrochlorothiazide (HYDRODIURIL) 25 MG tablet, Take 25 mg by mouth daily., Disp: , Rfl:  levothyroxine (SYNTHROID) 25 MCG tablet, Take 25 mcg by mouth daily., Disp: , Rfl: ;  lisinopril (PRINIVIL,ZESTRIL) 20 MG tablet, Take 20 mg by mouth daily., Disp: , Rfl: ;  methimazole (TAPAZOLE) 10 MG tablet, Take 10 mg by mouth daily., Disp: , Rfl: ;  metoprolol succinate (TOPROL-XL) 25 MG 24 hr tablet, Take 25 mg by mouth daily., Disp: , Rfl: ;  Pediatric Multiple Vitamins (CHEWABLE MULTIPLE VITAMINS PO), Take by mouth daily., Disp: , Rfl:  vitamin E (VITAMIN E) 1000 UNIT capsule, Take 1,000 Units by mouth daily., Disp: , Rfl:  Current facility-administered medications:0.9 %  sodium chloride infusion, , Intravenous, Continuous, Dayne Oley Balm III, MD;  vancomycin (VANCOCIN) 1 GM/200ML IVPB, , , , ;  vancomycin (VANCOCIN) IVPB 1000 mg/200 mL premix, 1,000 mg, Intravenous, Once, Dayne Oley Balm III, MD   Results for orders placed in visit on 10/19/11  CBC WITH DIFFERENTIAL      Component Value Range   WBC 4.8  3.9 - 10.3 (10e3/uL)   NEUT# 2.9  1.5 - 6.5 (10e3/uL)   HGB 12.0  11.6 - 15.9 (g/dL)   HCT 78.2  95.6 - 21.3 (%)   Platelets 287  145 - 400 (10e3/uL)   MCV 82.6  79.5 - 101.0 (fL)   MCH 27.1  25.1 - 34.0 (pg)   MCHC 32.8  31.5 - 36.0 (g/dL)   RBC 0.86  5.78 - 4.69 (10e6/uL)   RDW 14.3  11.2 - 14.5 (%)   lymph# 1.6  0.9 - 3.3 (10e3/uL)   MONO# 0.2  0.1 - 0.9 (10e3/uL)   Eosinophils Absolute 0.1  0.0 - 0.5 (10e3/uL)   Basophils Absolute 0.0  0.0 - 0.1 (10e3/uL)   NEUT% 60.0  38.4 - 76.8 (%)   LYMPH% 33.7  14.0 - 49.7 (%)   MONO% 3.8  0.0 - 14.0 (%)   EOS% 2.3  0.0 - 7.0 (%)   BASO% 0.2  0.0 - 2.0 (%)  CEA      Component Value Range   CEA <0.5  0.0 - 5.0 (ng/mL)  COMPREHENSIVE METABOLIC PANEL      Component Value Range   Sodium 138  128 - 145 (mEq/L)   Potassium 3.7  3.3 -  4.7 (mEq/L)   Chloride 93 (*) 98 - 108 (mEq/L)   CO2 30  18 - 33 (mEq/L)   Glucose, Bld 96  73 - 118 (mg/dL)   BUN, Bld 16  7 - 22 (mg/dL)   Creat 0.7  0.6 - 1.2 (mg/dl)   Total Bilirubin 1.61  0.20 - 1.60 (mg/dl)   Alkaline Phosphatase 70  26 - 84 (U/L)   AST 27  11 - 38 (U/L)   ALT(SGPT) 22  10 - 47 (U/L)   Total Protein 6.7  6.4 - 8.1 (g/dL)   Albumin 3.1 (*) 3.3 - 5.5 (g/dL)   Calcium 9.0  8.0 - 09.6 (mg/dL)    Review of Systems  Constitutional: Negative for fever and chills.  Respiratory: Negative for cough and shortness of breath.   Cardiovascular: Negative for chest pain.  Gastrointestinal: Negative for nausea, vomiting and abdominal pain.  Musculoskeletal: Negative for back pain.  Neurological: Negative for headaches.  Endo/Heme/Allergies: Does not bruise/bleed easily.    Blood pressure 163/100, pulse 44, temperature 98.6 F (37 C), resp. rate 21, SpO2 100.00%. Physical Exam  Constitutional: She is oriented to person, place, and time. She appears well-developed  and well-nourished.  Cardiovascular:       Bradycardic, but reg rhythm  Respiratory: Effort normal and breath sounds normal.  GI: Soft. Bowel sounds are normal. There is no tenderness.  Musculoskeletal: Normal range of motion. She exhibits no edema.  Neurological: She is alert and oriented to person, place, and time.     Assessment/Plan Patient with colon carcinoma, s/p completion of chemotherapy; plan is for port a cath removal. Details/risks of procedure d/w pt with her understanding and consent.  Lequita Meadowcroft,D KEVIN 11/04/2011, 8:01 AM

## 2011-11-04 NOTE — Procedures (Signed)
Successful removal of portacath.  No immediate complication. 

## 2011-11-04 NOTE — ED Notes (Signed)
Patient is resting comfortably. 

## 2011-11-09 ENCOUNTER — Encounter: Payer: Self-pay | Admitting: Emergency Medicine

## 2011-11-09 ENCOUNTER — Ambulatory Visit (INDEPENDENT_AMBULATORY_CARE_PROVIDER_SITE_OTHER): Payer: Medicare Other | Admitting: Emergency Medicine

## 2011-11-09 VITALS — BP 149/93 | HR 48 | Temp 96.8°F | Resp 16 | Ht 66.5 in | Wt 155.6 lb

## 2011-11-09 DIAGNOSIS — C73 Malignant neoplasm of thyroid gland: Secondary | ICD-10-CM

## 2011-11-09 DIAGNOSIS — E0590 Thyrotoxicosis, unspecified without thyrotoxic crisis or storm: Secondary | ICD-10-CM | POA: Diagnosis not present

## 2011-11-09 DIAGNOSIS — E049 Nontoxic goiter, unspecified: Secondary | ICD-10-CM | POA: Diagnosis not present

## 2011-11-09 LAB — CBC
HCT: 39 % (ref 36.0–46.0)
MCHC: 33.8 g/dL (ref 30.0–36.0)
MCV: 79.4 fL (ref 78.0–100.0)
Platelets: 347 10*3/uL (ref 150–400)
RDW: 14.9 % (ref 11.5–15.5)

## 2011-11-09 NOTE — Progress Notes (Signed)
  Subjective:    Patient ID: Debra Huber, female    DOB: 06-10-1951, 61 y.o.   MRN: [id number]  HPI patient has a history of colon cancer and Graves' disease. She was recently found on CT of the chest abdomen nodule in the right side of her neck. Biopsy was performed and showed a nonneoplastic goiter. Patient enters today because she is interested in having a nodule in her thyroid removed. She does not want to stay on medication any longer and would prefer to have her thyroid removed    Review of Systems     Objective:   Physical Exam physical exam reveals mild exophthalmos. Neck is otherwise supple. The port site for treatment of her colon cancer has been removed and is healing. Her chest is clear heart is regular rate and rhythm.        Assessment & Plan:  Patient here with a history of Graves' disease who is in and surgical opinion regarding resection of a nodule in the right lobe of the thyroid. Will refer to Dr. Wenda Low for his opinion regarding whether she is a candidate for surgery or not. Previous biopsy shows a nonneoplastic lesion

## 2011-12-06 ENCOUNTER — Other Ambulatory Visit: Payer: Self-pay | Admitting: Family Medicine

## 2011-12-06 MED ORDER — METOPROLOL SUCCINATE ER 25 MG PO TB24: 25.0000 mg | ORAL_TABLET | Freq: Every day | ORAL | Status: DC

## 2011-12-06 MED ORDER — LISINOPRIL 20 MG PO TABS: 20.0000 mg | ORAL_TABLET | Freq: Every day | ORAL | Status: DC

## 2011-12-06 MED ORDER — HYDROCHLOROTHIAZIDE 25 MG PO TABS: 25.0000 mg | ORAL_TABLET | Freq: Every day | ORAL | Status: DC

## 2011-12-09 ENCOUNTER — Other Ambulatory Visit (INDEPENDENT_AMBULATORY_CARE_PROVIDER_SITE_OTHER): Payer: Self-pay | Admitting: Surgery

## 2011-12-09 ENCOUNTER — Ambulatory Visit (INDEPENDENT_AMBULATORY_CARE_PROVIDER_SITE_OTHER): Payer: Medicare Other | Admitting: Surgery

## 2011-12-09 ENCOUNTER — Encounter (INDEPENDENT_AMBULATORY_CARE_PROVIDER_SITE_OTHER): Payer: Self-pay | Admitting: Surgery

## 2011-12-09 VITALS — BP 142/98 | HR 64 | Temp 96.6°F | Resp 16 | Ht 67.0 in | Wt 154.1 lb

## 2011-12-09 DIAGNOSIS — E0500 Thyrotoxicosis with diffuse goiter without thyrotoxic crisis or storm: Secondary | ICD-10-CM | POA: Diagnosis not present

## 2011-12-09 MED ORDER — IODINE STRONG (LUGOLS) 5 % PO SOLN: 0.2000 mL | Freq: Three times a day (TID) | ORAL | Status: DC

## 2011-12-09 NOTE — Progress Notes (Signed)
Chief Complaint:  Grave's disease not medically compliant  History of Present Illness:  Debra Huber is an 61 y.o. female who was seen by me last April to discuss surgery. At that time I discussed radioactive iodine ablation to avoid surgery but apparently she did not want to do that. At that time explained the issues with subtotal thyroidectomy including potential hypocalcemia and recurrent nerve injury.  She was referred back by Dr. [name] wanting to go in undergo surgery. She's been very frustrated with how the medications make her feel. Specifically she claims a change in her coordination that she says is not related to her vital signs. She has a twin sister who has undergone subtotal thyroidectomy and treating her thyroid disease and she is off medications and doing well.She was to go ahead and schedule surgery. Will give her a week of Lugol solution. She is already on beta blockers and takes Tapazole.  Past Medical History  Diagnosis Date  . Anemia   . Hypertension   . Thyroid disease   . Colon cancer dx'd 11/2009    Past Surgical History  Procedure Date  . Colectomy     partial    Current Outpatient Prescriptions  Medication Sig Dispense Refill  . amitriptyline (ELAVIL) 25 MG tablet Take 25 mg by mouth at bedtime.      Marland Kitchen aspirin EC 81 MG tablet Take 81 mg by mouth as needed.      . cloNIDine (CATAPRES) 0.3 MG tablet Take 0.3 mg by mouth 2 (two) times daily.      . Coenzyme Q10 (COQ10 PO) Take by mouth daily.      . hydrochlorothiazide (HYDRODIURIL) 25 MG tablet Take 1 tablet (25 mg total) by mouth daily.  30 tablet  0  . Iodine, Kelp, (KELP PO) Take by mouth daily.      Marland Kitchen levothyroxine (SYNTHROID) 25 MCG tablet Take 25 mcg by mouth daily.      Marland Kitchen lisinopril (PRINIVIL,ZESTRIL) 20 MG tablet Take 1 tablet (20 mg total) by mouth daily.  30 tablet  0  . methimazole (TAPAZOLE) 10 MG tablet Take 10 mg by mouth daily.      . metoprolol succinate (TOPROL-XL) 25 MG 24 hr tablet Take 1  tablet (25 mg total) by mouth daily.  30 tablet  0  . Pediatric Multiple Vitamins (CHEWABLE MULTIPLE VITAMINS PO) Take by mouth daily.      . vitamin E (VITAMIN E) 1000 UNIT capsule Take 1,000 Units by mouth daily.       Penicillins No family history on file. Social History:   reports that she has never smoked. She does not have any smokeless tobacco history on file. She reports that she does not drink alcohol. Her drug history not on file.   REVIEW OF SYSTEMS - PERTINENT POSITIVES ONLY: noncontributory  Physical Exam:   Blood pressure 142/98, pulse 64, temperature 96.6 F (35.9 C), temperature source Temporal, resp. rate 16, height 5\' 7"  (1.702 m), weight 154 lb 2 oz (69.911 kg). Body mass index is 24.14 kg/(m^2).  Gen:  WDWN female NAD  Neurological: Alert and oriented to person, place, and time. Motor and sensory function is grossly intact  Head: Normocephalic and atraumatic.  Eyes: Conjunctivae are normal. Pupils are equal, round, and reactive to light. No scleral icterus. Mild exophtalmos  Neck: Normal range of motion. Neck supple. No tracheal deviation or thyromegaly present. Palpable thyroid Cardiovascular:  SR without murmurs or gallops.  No carotid bruits.  Rate controlled  by beta blocker Respiratory: Effort normal.  No respiratory distress. No chest wall tenderness. Breath sounds normal.  No wheezes, rales or rhonchi.  Abdomen:  nontender GU: Musculoskeletal: Normal range of motion. Extremities are nontender. No cyanosis, edema or clubbing noted Lymphadenopathy: No cervical, preauricular, postauricular or axillary adenopathy is present Skin: Skin is warm and dry. No rash noted. No diaphoresis. No erythema. No pallor. Pscyh: Normal mood and affect. Behavior is normal. Judgment and thought content normal.   LABORATORY RESULTS: No results found for this or any previous visit (from the past 48 hour(s)).  RADIOLOGY RESULTS: No results found.  Problem List: Patient Active  Problem List  Diagnosis  . Malignant neoplasm of colon, unspecified site  . Thyroid disease  . Hypertension    Assessment & Plan: History of Graves disease wanting to undergo surgery to minimize medications.  She is currently followed by Dr. [name] and Dr. Ardyth Harps.  Will treat with Lugol iodine and schedule subtotal thyroidectomy    Matt B. Daphine Deutscher, MD, [facility] [phone] beeper [phone]  12/09/2011 10:26 AM

## 2011-12-09 NOTE — Patient Instructions (Signed)
Thyroidectomy Thyroidectomy is the removal of part or all of your thyroid gland. Your thyroid gland is a butterfly-shaped gland at the base of your neck. It produces a substance called thyroid hormone, which regulates the physical and chemical processes that keep your body functioning and make energy available to your body (metabolism). The amount of thyroid gland tissue that is removed during a thyroidectomy depends on the reason for the procedure. Typically, if only a part of your gland is removed, enough thyroid gland tissue remains to maintain normal function. If your entire thyroid gland is removed or if the amount of thyroid gland tissue remaining is inadequate to maintain normal function, you will need life-long treatment with thyroid hormone on a daily basis. Thyroidectomy maybe performed when you have the following conditions:  Thyroid nodules. These are small, abnormal collections of tissue that form inside the thyroid gland. If these nodules begin to enlarge at a rapid rate, a sample of tissue from the nodule is taken through a needle and examined (needle biopsy). This is done to determine if the nodules are cancerous. Depending on the outcome of this exam, thyroidectomy may be necessary.   Thyroid cancer.   Goiter, which is an enlarged thyroid gland. All or part of the thyroid gland may be removed if the gland has become so large that it causes difficulty breathing or swallowing.   Hyperthyroidism. This is when the thyroid gland produces too much thyroid hormone. Hypothyroidism can cause symptoms of fluctuating weight, intolerance to heat, irritability, shortness of breath, and chest pain.  LET YOUR CAREGIVER KNOW ABOUT:   Allergies to food or medicine.   Medicines that you are taking, including vitamins, herbs, eyedrops, over-the-counter medicines, and creams.   Previous problems you have had with anesthetics or numbing medicines.   History of bleeding problems or blood clots.    Previous surgeries you have had.   Other health problems, including diabetes and kidney problems, you have had.   Possibility of pregnancy, if this applies.  BEFORE THE PROCEDURE   Do not eat or drink anything, including water, for at least 6 hours before the procedure.   Ask your caregiver whether you should stop taking certain medicines before the day of the procedure.  PROCEDURE  There are different ways that thyroidectomy is performed. For each type, you will be given a medicine to make you sleep (general anesthetic). The three main types of thyroidectomy are listed as follows:  Conventional thyroidectomy-A cut (incision) in the center portion of your lower neck is made with a scalpel. Muscles below your skin are separated to gain access to your thyroid gland. Your thyroid gland is dissected from your windpipe (trachea). Often a drain is placed at the incision site to drain any blood that accumulates under the skin after the procedure. This drain will be removed before you go home. The wound from the incision should heal within 2 weeks.   Endoscopic thyroidectomy-Small incisions are made in your lower neck. A small instrument (endoscope) is inserted under your skin at the incision sites. The endoscope used for thyroidectomy consists of 2 flexible tubes. Inside one of the tubes is a video camera that is used to guide the surgeon. Tools to remove the thyroid gland, including a tool to cut the gland (dissectors) and a suction device, are inserted through the other tube. The surgeon uses the dissectors to dissect the thyroid gland from the trachea and remove it.   Robotic thyroidectomy-This procedure allows your thyroid gland to be   removed through incisions in your armpit, your chest, or high in your neck. Instruments similar to endoscopes provide a 3-dimensional picture of the surgical site. Dissecting instruments are controlled by devices similar to joysticks. These devices allow more accurate  manipulation of the instruments. After the blood supply to the gland is removed, the gland is cut into several pieces and removed through the incisions.  RISKS AND COMPLICATIONS Complications associated with thyroidectomy are rare, but they can occur. Possible complications include:  A decrease in parathyroid hormone levels (hypoparathyroidism)-Your parathyroid glands are located close behind your thyroid gland. They are responsible for maintaining calcium levels inthe body. If they are damaged or removed, levels of calcium in the blood become low and nerves become irritable, which can cause muscle spasms. Medicines are available to treat this.   Bacterial infection-This can often be treated with medicines that kill bacteria (antibiotics).   Damage to your voice box nerves-This could cause hoarseness or complete loss of voice.   Bleeding or airway obstruction.  AFTER THE PROCEDURE   You will rest in the recovery room as you wake up.   When you first wake up, your throat may feel slightly sore.   You will not be allowed to eat or drink until instructed otherwise.   You will be taken to your hospital room. You will usually stay at the hospital for 1 or 2 nights.   If a drain is placed during the procedure, it usually is removed the next day.   You may have some mild neck pain.   Your voice may be weak. This usually is temporary.  Document Released: 11/24/2000 Document Revised: 05/20/2011 Document Reviewed: 09/02/2010 [facility] Patient Information 2012 [place], [place].  Take the Lugol's iodine for 5 days prior to your thyroidectomy

## 2011-12-18 ENCOUNTER — Other Ambulatory Visit: Payer: Self-pay | Admitting: *Deleted

## 2011-12-18 MED ORDER — CLONIDINE HCL 0.3 MG PO TABS: 0.3000 mg | ORAL_TABLET | Freq: Two times a day (BID) | ORAL | Status: DC

## 2011-12-29 ENCOUNTER — Ambulatory Visit (INDEPENDENT_AMBULATORY_CARE_PROVIDER_SITE_OTHER): Payer: Medicare Other | Admitting: Emergency Medicine

## 2011-12-29 ENCOUNTER — Encounter: Payer: Self-pay | Admitting: Emergency Medicine

## 2011-12-29 VITALS — BP 138/92 | HR 72 | Temp 98.4°F | Resp 16 | Ht 66.5 in | Wt 155.2 lb

## 2011-12-29 DIAGNOSIS — E0590 Thyrotoxicosis, unspecified without thyrotoxic crisis or storm: Secondary | ICD-10-CM | POA: Diagnosis not present

## 2011-12-29 DIAGNOSIS — I10 Essential (primary) hypertension: Secondary | ICD-10-CM

## 2011-12-29 MED ORDER — METOPROLOL SUCCINATE ER 25 MG PO TB24: 25.0000 mg | ORAL_TABLET | Freq: Every day | ORAL | Status: DC

## 2011-12-29 MED ORDER — AMITRIPTYLINE HCL 25 MG PO TABS: 25.0000 mg | ORAL_TABLET | Freq: Every day | ORAL | Status: DC

## 2011-12-29 MED ORDER — CLONIDINE HCL 0.3 MG PO TABS: 0.3000 mg | ORAL_TABLET | Freq: Two times a day (BID) | ORAL | Status: DC

## 2011-12-29 MED ORDER — METHIMAZOLE 10 MG PO TABS: 10.0000 mg | ORAL_TABLET | Freq: Every day | ORAL | Status: DC

## 2011-12-29 MED ORDER — HYDROCHLOROTHIAZIDE 25 MG PO TABS: 25.0000 mg | ORAL_TABLET | Freq: Every day | ORAL | Status: DC

## 2011-12-29 NOTE — Progress Notes (Signed)
  Subjective:    Patient ID: Debra Huber, female    DOB: 1950/12/14, 61 y.o.   MRN: [id number]  HPI and followup on her thyroid disease and hypertension. She's doing well on her current medications. Plan is to have surgery with Dr. Wenda Low in September she is currently off of chemotherapy for her colon cancer.    Review of Systems     Objective:   Physical Exam is a multinodular goiter on exam. There is a scar in the right anterior her heart is regular rate without murmurs lungs are clear to auscultation abdomen has a large midline scar without tenderness or masses the        Assessment & Plan:  Patient did well. She was sedated with Versed upcoming surgery. Meds refilled for a year.

## 2012-01-19 ENCOUNTER — Other Ambulatory Visit: Payer: Self-pay | Admitting: Physician Assistant

## 2012-02-04 ENCOUNTER — Encounter (HOSPITAL_COMMUNITY): Payer: Self-pay | Admitting: Pharmacy Technician

## 2012-02-15 ENCOUNTER — Encounter (HOSPITAL_COMMUNITY): Payer: Self-pay

## 2012-02-15 ENCOUNTER — Encounter (HOSPITAL_COMMUNITY)
Admission: RE | Admit: 2012-02-15 | Discharge: 2012-02-15 | Disposition: A | Discharge status: Home / Self Care | Payer: Medicare Other | Source: Ambulatory Visit | Attending: Surgery | Admitting: Surgery

## 2012-02-15 DIAGNOSIS — D62 Acute posthemorrhagic anemia: Secondary | ICD-10-CM | POA: Diagnosis not present

## 2012-02-15 DIAGNOSIS — E0500 Thyrotoxicosis with diffuse goiter without thyrotoxic crisis or storm: Secondary | ICD-10-CM | POA: Diagnosis not present

## 2012-02-15 DIAGNOSIS — Z01812 Encounter for preprocedural laboratory examination: Secondary | ICD-10-CM | POA: Diagnosis not present

## 2012-02-15 DIAGNOSIS — IMO0002 Reserved for concepts with insufficient information to code with codable children: Secondary | ICD-10-CM | POA: Diagnosis not present

## 2012-02-15 HISTORY — DX: Thyrotoxicosis, unspecified without thyrotoxic crisis or storm: E05.90

## 2012-02-15 HISTORY — DX: Adverse effect of unspecified anesthetic, initial encounter: T41.45XA

## 2012-02-15 HISTORY — DX: Other complications of anesthesia, initial encounter: T88.59XA

## 2012-02-15 LAB — CBC
Hemoglobin: 13.5 g/dL (ref 12.0–15.0)
MCH: 28 pg (ref 26.0–34.0)
MCV: 82.8 fL (ref 78.0–100.0)
RBC: 4.82 MIL/uL (ref 3.87–5.11)

## 2012-02-15 LAB — BASIC METABOLIC PANEL
CO2: 28 mEq/L (ref 19–32)
Calcium: 9.4 mg/dL (ref 8.4–10.5)
Creatinine, Ser: 0.52 mg/dL (ref 0.50–1.10)
Glucose, Bld: 94 mg/dL (ref 70–99)

## 2012-02-15 MED ORDER — CHLORHEXIDINE GLUCONATE 4 % EX LIQD: 1.0000 "application " | Freq: Once | CUTANEOUS | Status: DC

## 2012-02-15 NOTE — Patient Instructions (Addendum)
YOUR SURGERY IS SCHEDULED ON:  Friday  9/13  7:30 AM  REPORT TO [facility] AT:   5:30 AM      PHONE # FOR SHORT STAY IS [phone]  STOP ASPIRIN 5 DAYS BEFORE YOUR SURGERY--LAST ASPIRIN SHOULD BE Saturday 9/7.  FLEETS ENEMA NIGHT BEFORE YOUR SURGERY.  DO NOT EAT OR DRINK ANYTHING AFTER MIDNIGHT THE NIGHT BEFORE YOUR SURGERY.  YOU MAY BRUSH YOUR TEETH, RINSE OUT YOUR MOUTH--BUT NO WATER, NO FOOD, NO CHEWING GUM, NO MINTS, NO CANDIES, NO CHEWING TOBACCO.  PLEASE TAKE THE FOLLOWING MEDICATIONS THE AM OF YOUR SURGERY WITH A FEW SIPS OF WATER:  CLONIDINE AND METOPROLOL    DO NOT BRING VALUABLES, MONEY, CREDIT CARDS.  CONTACT LENS, DENTURES / PARTIALS, GLASSES SHOULD NOT BE WORN TO SURGERY AND IN MOST CASES-HEARING AIDS WILL NEED TO BE REMOVED.  BRING YOUR GLASSES CASE, ANY EQUIPMENT NEEDED FOR YOUR CONTACT LENS. FOR PATIENTS ADMITTED TO THE HOSPITAL--CHECK OUT TIME THE DAY OF DISCHARGE IS 11:00 AM.  ALL INPATIENT ROOMS ARE PRIVATE - WITH BATHROOM, TELEPHONE, TELEVISION AND WIFI INTERNET. IF YOU ARE BEING DISCHARGED THE SAME DAY OF YOUR SURGERY--YOU CAN NOT DRIVE YOURSELF HOME--AND SHOULD NOT GO HOME ALONE BY TAXI OR BUS.  NO DRIVING OR OPERATING MACHINERY FOR 24 HOURS FOLLOWING ANESTHESIA / PAIN MEDICATIONS.                            SPECIAL INSTRUCTIONS:  CHLORHEXIDINE SOAP SHOWER (other brand names are Betasept and Hibiclens ) PLEASE SHOWER WITH CHLORHEXIDINE THE NIGHT BEFORE YOUR SURGERY AND THE AM OF YOUR SURGERY. DO NOT USE CHLORHEXIDINE ON YOUR FACE OR PRIVATE AREAS--YOU MAY USE YOUR NORMAL SOAP THOSE AREAS AND YOUR NORMAL SHAMPOO.  WOMEN SHOULD AVOID SHAVING UNDER ARMS AND SHAVING LEGS 48 HOURS BEFORE USING CHLORHEXIDINE TO AVOID SKIN IRRITATION.  DO NOT USE IF ALLERGIC TO CHLORHEXIDINE.  PLEASE READ OVER ANY  FACT SHEETS THAT YOU WERE GIVEN: MRSA INFORMATION

## 2012-02-15 NOTE — Pre-Procedure Instructions (Signed)
CBC, BMET, EKG WERE DONE PREOP TODAY AT [facility] AS PER ANESTHESIOLOGIST'S GUIDELINES. PT HAS CHEST CT REPORT IN [place] 10/20/11. PT DID NOT SIGN SURGICAL CONSENT TODAY--SHE WANTS TO TALK TO HER FAMILY AND SHE MAY HAVE QUESTIONS FOR DR. MARTIN. PREOP INSTRUCTIONS DISCUSSED USING TEACH BACK METHOD.

## 2012-02-15 NOTE — Progress Notes (Signed)
DR. Daphine Deutscher          PT STATED AT HER PREOP VISIT TODAY THAT SHE HAD STOPPED TAKING HER TAPAZOLE ABOUT 3 MONTHS AGO - BUT THAT YOU ARE NOT AWARE.          PT DID NOT SIGN HER SURGICAL CONSENT TODAY--WANTED TO TALK TO HER FAMILY AND STATED SHE MAY HAVE QUESTIONS FOR YOU BEFORE SIGNING CONSENT.

## 2012-02-21 ENCOUNTER — Telehealth (INDEPENDENT_AMBULATORY_CARE_PROVIDER_SITE_OTHER): Payer: Self-pay

## 2012-02-21 ENCOUNTER — Telehealth (INDEPENDENT_AMBULATORY_CARE_PROVIDER_SITE_OTHER): Payer: Self-pay | Admitting: General Surgery

## 2012-02-21 NOTE — Telephone Encounter (Signed)
Spoke with pt after speaking with Dr. Daphine Deutscher.  He said that there is nothing else she could take instead.  She was okay with this.

## 2012-02-21 NOTE — Telephone Encounter (Signed)
The pt is scheduled for Thyroid surgery Friday.  She is taking the Lugol's Solution.  She started it yesterday.  Yesterday she had diarrhea.  Today she is nauseated.  She wants to know if there is anything else she can take in its place

## 2012-02-25 ENCOUNTER — Encounter (HOSPITAL_COMMUNITY): Payer: Self-pay | Admitting: Anesthesiology

## 2012-02-25 ENCOUNTER — Encounter (HOSPITAL_COMMUNITY)
Admission: RE | Disposition: A | Discharge status: Home / Self Care | Payer: Self-pay | Source: Ambulatory Visit | Attending: Surgery

## 2012-02-25 ENCOUNTER — Inpatient Hospital Stay (HOSPITAL_COMMUNITY)
Admission: RE | Admit: 2012-02-25 | Discharge: 2012-02-28 | DRG: 626 | Disposition: A | Discharge status: Home / Self Care | Payer: Medicare Other | Source: Ambulatory Visit | Attending: Surgery | Admitting: Surgery

## 2012-02-25 ENCOUNTER — Encounter (HOSPITAL_COMMUNITY): Payer: Self-pay | Admitting: *Deleted

## 2012-02-25 ENCOUNTER — Ambulatory Visit (HOSPITAL_COMMUNITY): Payer: Medicare Other | Admitting: Anesthesiology

## 2012-02-25 DIAGNOSIS — E0500 Thyrotoxicosis with diffuse goiter without thyrotoxic crisis or storm: Principal | ICD-10-CM | POA: Diagnosis present

## 2012-02-25 DIAGNOSIS — Z01812 Encounter for preprocedural laboratory examination: Secondary | ICD-10-CM | POA: Diagnosis not present

## 2012-02-25 DIAGNOSIS — Y836 Removal of other organ (partial) (total) as the cause of abnormal reaction of the patient, or of later complication, without mention of misadventure at the time of the procedure: Secondary | ICD-10-CM | POA: Diagnosis not present

## 2012-02-25 DIAGNOSIS — C73 Malignant neoplasm of thyroid gland: Secondary | ICD-10-CM | POA: Diagnosis not present

## 2012-02-25 DIAGNOSIS — D62 Acute posthemorrhagic anemia: Secondary | ICD-10-CM | POA: Diagnosis not present

## 2012-02-25 DIAGNOSIS — E079 Disorder of thyroid, unspecified: Secondary | ICD-10-CM

## 2012-02-25 DIAGNOSIS — IMO0002 Reserved for concepts with insufficient information to code with codable children: Secondary | ICD-10-CM | POA: Diagnosis not present

## 2012-02-25 DIAGNOSIS — I10 Essential (primary) hypertension: Secondary | ICD-10-CM | POA: Diagnosis present

## 2012-02-25 DIAGNOSIS — E063 Autoimmune thyroiditis: Secondary | ICD-10-CM | POA: Diagnosis not present

## 2012-02-25 HISTORY — PX: THYROIDECTOMY: SHX17

## 2012-02-25 LAB — CBC
MCH: 27.9 pg (ref 26.0–34.0)
MCHC: 33.5 g/dL (ref 30.0–36.0)
MCV: 83.4 fL (ref 78.0–100.0)
Platelets: 273 10*3/uL (ref 150–400)
RDW: 14.1 % (ref 11.5–15.5)

## 2012-02-25 SURGERY — THYROIDECTOMY
Anesthesia: General | Wound class: Clean

## 2012-02-25 MED ORDER — 0.9 % SODIUM CHLORIDE (POUR BTL) OPTIME
TOPICAL | Status: DC | PRN
  Administered 2012-02-25: 1000 mL

## 2012-02-25 MED ORDER — HYDRALAZINE HCL 20 MG/ML IJ SOLN
INTRAMUSCULAR | Status: DC | PRN
  Administered 2012-02-25: 10 mg via INTRAVENOUS

## 2012-02-25 MED ORDER — AMITRIPTYLINE HCL 25 MG PO TABS
25.0000 mg | ORAL_TABLET | Freq: Every day | ORAL | Status: DC
  Administered 2012-02-25 – 2012-02-27 (×3): 25 mg via ORAL
  Filled 2012-02-25 (×6): qty 1

## 2012-02-25 MED ORDER — ONDANSETRON HCL 4 MG PO TABS: 4.0000 mg | ORAL_TABLET | Freq: Four times a day (QID) | ORAL | Status: DC | PRN

## 2012-02-25 MED ORDER — LACTATED RINGERS IV SOLN
INTRAVENOUS | Status: DC | PRN
  Administered 2012-02-25 (×2): via INTRAVENOUS

## 2012-02-25 MED ORDER — HEPARIN SODIUM (PORCINE) 5000 UNIT/ML IJ SOLN
5000.0000 [IU] | Freq: Three times a day (TID) | INTRAMUSCULAR | Status: DC
  Administered 2012-02-25 (×2): 5000 [IU] via SUBCUTANEOUS
  Filled 2012-02-25 (×6): qty 1

## 2012-02-25 MED ORDER — CLONIDINE HCL 0.3 MG PO TABS
0.3000 mg | ORAL_TABLET | Freq: Two times a day (BID) | ORAL | Status: DC
  Administered 2012-02-25 – 2012-02-27 (×5): 0.3 mg via ORAL
  Filled 2012-02-25 (×9): qty 1

## 2012-02-25 MED ORDER — ONDANSETRON HCL 4 MG/2ML IJ SOLN
INTRAMUSCULAR | Status: DC | PRN
  Administered 2012-02-25: 4 mg via INTRAVENOUS

## 2012-02-25 MED ORDER — MEPERIDINE HCL 50 MG/ML IJ SOLN: 6.2500 mg | INTRAMUSCULAR | Status: DC | PRN

## 2012-02-25 MED ORDER — METOPROLOL SUCCINATE ER 25 MG PO TB24
25.0000 mg | ORAL_TABLET | Freq: Every day | ORAL | Status: DC
  Administered 2012-02-26 – 2012-02-28 (×3): 25 mg via ORAL
  Filled 2012-02-25 (×5): qty 1

## 2012-02-25 MED ORDER — PROMETHAZINE HCL 25 MG/ML IJ SOLN: 6.2500 mg | INTRAMUSCULAR | Status: DC | PRN

## 2012-02-25 MED ORDER — MORPHINE SULFATE 2 MG/ML IJ SOLN
1.0000 mg | INTRAMUSCULAR | Status: DC | PRN
  Administered 2012-02-25 (×2): 1 mg via INTRAVENOUS
  Filled 2012-02-25 (×2): qty 1

## 2012-02-25 MED ORDER — FENTANYL CITRATE 0.05 MG/ML IJ SOLN
INTRAMUSCULAR | Status: DC | PRN
  Administered 2012-02-25: 100 ug via INTRAVENOUS
  Administered 2012-02-25: 50 ug via INTRAVENOUS
  Administered 2012-02-25: 100 ug via INTRAVENOUS
  Administered 2012-02-25 (×2): 50 ug via INTRAVENOUS

## 2012-02-25 MED ORDER — NEOSTIGMINE METHYLSULFATE 1 MG/ML IJ SOLN
INTRAMUSCULAR | Status: DC | PRN
  Administered 2012-02-25: 4 mg via INTRAVENOUS

## 2012-02-25 MED ORDER — DEXAMETHASONE SODIUM PHOSPHATE 10 MG/ML IJ SOLN
INTRAMUSCULAR | Status: DC | PRN
  Administered 2012-02-25: 10 mg via INTRAVENOUS

## 2012-02-25 MED ORDER — LIDOCAINE HCL (CARDIAC) 20 MG/ML IV SOLN
INTRAVENOUS | Status: DC | PRN
  Administered 2012-02-25: 50 mg via INTRAVENOUS

## 2012-02-25 MED ORDER — BUPIVACAINE-EPINEPHRINE PF 0.25-1:200000 % IJ SOLN
INTRAMUSCULAR | Status: DC | PRN
  Administered 2012-02-25: 10 mL

## 2012-02-25 MED ORDER — CEFAZOLIN SODIUM-DEXTROSE 2-3 GM-% IV SOLR
INTRAVENOUS | Status: DC | PRN
  Administered 2012-02-25: 2 g via INTRAVENOUS

## 2012-02-25 MED ORDER — HYDROMORPHONE HCL PF 1 MG/ML IJ SOLN
INTRAMUSCULAR | Status: DC | PRN
  Administered 2012-02-25 (×2): 0.5 mg via INTRAVENOUS
  Administered 2012-02-25: 1 mg via INTRAVENOUS

## 2012-02-25 MED ORDER — GLYCOPYRROLATE 0.2 MG/ML IJ SOLN
INTRAMUSCULAR | Status: DC | PRN
  Administered 2012-02-25: 0.6 mg via INTRAVENOUS

## 2012-02-25 MED ORDER — HYDROCHLOROTHIAZIDE 25 MG PO TABS
25.0000 mg | ORAL_TABLET | Freq: Every day | ORAL | Status: DC
  Administered 2012-02-26 – 2012-02-28 (×3): 25 mg via ORAL
  Filled 2012-02-25 (×5): qty 1

## 2012-02-25 MED ORDER — KCL IN DEXTROSE-NACL 20-5-0.45 MEQ/L-%-% IV SOLN
INTRAVENOUS | Status: DC
  Administered 2012-02-25 – 2012-02-26 (×2): via INTRAVENOUS
  Administered 2012-02-26: 75 mL/h via INTRAVENOUS
  Administered 2012-02-26: 04:00:00 via INTRAVENOUS
  Filled 2012-02-25 (×5): qty 1000

## 2012-02-25 MED ORDER — OXYCODONE HCL 5 MG PO TABS
5.0000 mg | ORAL_TABLET | Freq: Once | ORAL | Status: DC | PRN
Start: 2012-02-25 — End: 2012-02-25

## 2012-02-25 MED ORDER — ACETAMINOPHEN 10 MG/ML IV SOLN
INTRAVENOUS | Status: DC | PRN
  Administered 2012-02-25: 1000 mg via INTRAVENOUS

## 2012-02-25 MED ORDER — ACETAMINOPHEN 10 MG/ML IV SOLN: 1000.0000 mg | Freq: Once | INTRAVENOUS | Status: DC | PRN

## 2012-02-25 MED ORDER — OXYCODONE-ACETAMINOPHEN 5-325 MG PO TABS
1.0000 | ORAL_TABLET | ORAL | Status: DC | PRN
  Administered 2012-02-25 – 2012-02-26 (×2): 2 via ORAL
  Filled 2012-02-25 (×2): qty 2

## 2012-02-25 MED ORDER — ONDANSETRON HCL 4 MG/2ML IJ SOLN
4.0000 mg | Freq: Four times a day (QID) | INTRAMUSCULAR | Status: DC | PRN
  Filled 2012-02-25: qty 2

## 2012-02-25 MED ORDER — CALCIUM CARBONATE ANTACID 500 MG PO CHEW
400.0000 mg | CHEWABLE_TABLET | Freq: Three times a day (TID) | ORAL | Status: DC
  Administered 2012-02-25 – 2012-02-27 (×8): 400 mg via ORAL
  Filled 2012-02-25 (×12): qty 2

## 2012-02-25 MED ORDER — CEFAZOLIN SODIUM-DEXTROSE 2-3 GM-% IV SOLR
INTRAVENOUS | Status: AC
  Filled 2012-02-25: qty 50

## 2012-02-25 MED ORDER — ACETAMINOPHEN 10 MG/ML IV SOLN
INTRAVENOUS | Status: AC
  Filled 2012-02-25: qty 100

## 2012-02-25 MED ORDER — HEPARIN SODIUM (PORCINE) 5000 UNIT/ML IJ SOLN
5000.0000 [IU] | Freq: Once | INTRAMUSCULAR | Status: AC
  Administered 2012-02-25: 5000 [IU] via SUBCUTANEOUS
  Filled 2012-02-25: qty 1

## 2012-02-25 MED ORDER — HEMOSTATIC AGENTS (NO CHARGE) OPTIME
TOPICAL | Status: DC | PRN
  Administered 2012-02-25: 1 via TOPICAL

## 2012-02-25 MED ORDER — BUPIVACAINE-EPINEPHRINE PF 0.25-1:200000 % IJ SOLN
INTRAMUSCULAR | Status: AC
  Filled 2012-02-25: qty 30

## 2012-02-25 MED ORDER — HYDROMORPHONE HCL PF 1 MG/ML IJ SOLN: 0.2500 mg | INTRAMUSCULAR | Status: DC | PRN

## 2012-02-25 MED ORDER — ROCURONIUM BROMIDE 100 MG/10ML IV SOLN
INTRAVENOUS | Status: DC | PRN
  Administered 2012-02-25 (×2): 10 mg via INTRAVENOUS
  Administered 2012-02-25: 50 mg via INTRAVENOUS
  Administered 2012-02-25 (×2): 10 mg via INTRAVENOUS

## 2012-02-25 MED ORDER — PROPOFOL 10 MG/ML IV BOLUS
INTRAVENOUS | Status: DC | PRN
  Administered 2012-02-25: 1160 mg via INTRAVENOUS

## 2012-02-25 MED ORDER — MIDAZOLAM HCL 5 MG/5ML IJ SOLN
INTRAMUSCULAR | Status: DC | PRN
  Administered 2012-02-25 (×2): 1 mg via INTRAVENOUS

## 2012-02-25 MED ORDER — LISINOPRIL 20 MG PO TABS
20.0000 mg | ORAL_TABLET | Freq: Every day | ORAL | Status: DC
  Administered 2012-02-26 – 2012-02-28 (×3): 20 mg via ORAL
  Filled 2012-02-25 (×5): qty 1

## 2012-02-25 MED ORDER — OXYCODONE HCL 5 MG/5ML PO SOLN
5.0000 mg | Freq: Once | ORAL | Status: DC | PRN
  Filled 2012-02-25: qty 5

## 2012-02-25 SURGICAL SUPPLY — 45 items
ATTRACTOMAT 16X20 MAGNETIC DRP (DRAPES) ×3 IMPLANT
BENZOIN TINCTURE PRP APPL 2/3 (GAUZE/BANDAGES/DRESSINGS) ×3 IMPLANT
BLADE HEX COATED 2.75 (ELECTRODE) ×3 IMPLANT
BLADE SURG 15 STRL LF DISP TIS (BLADE) ×2 IMPLANT
BLADE SURG 15 STRL SS (BLADE) ×1
BLADE SURG SZ10 CARB STEEL (BLADE) ×3 IMPLANT
CANISTER SUCTION 2500CC (MISCELLANEOUS) ×3 IMPLANT
CLIP TI MEDIUM 6 (CLIP) ×12 IMPLANT
CLIP TI WIDE RED SMALL 6 (CLIP) ×9 IMPLANT
CLOTH BEACON ORANGE TIMEOUT ST (SAFETY) ×3 IMPLANT
DISSECTOR ROUND CHERRY 3/8 STR (MISCELLANEOUS) ×3 IMPLANT
DRAPE PED LAPAROTOMY (DRAPES) ×3 IMPLANT
DRESSING SURGICEL FIBRLLR 1X2 (HEMOSTASIS) ×2 IMPLANT
DRSG SURGICEL FIBRILLAR 1X2 (HEMOSTASIS) ×3
ELECT NEEDLE TIP 2.8 STRL (NEEDLE) IMPLANT
ELECT REM PT RETURN 9FT ADLT (ELECTROSURGICAL) ×3
ELECTRODE REM PT RTRN 9FT ADLT (ELECTROSURGICAL) ×2 IMPLANT
GAUZE SPONGE 4X4 16PLY XRAY LF (GAUZE/BANDAGES/DRESSINGS) ×3 IMPLANT
GLOVE BIOGEL M 8.0 STRL (GLOVE) ×3 IMPLANT
GLOVE BIOGEL PI IND STRL 7.0 (GLOVE) ×4 IMPLANT
GLOVE BIOGEL PI INDICATOR 7.0 (GLOVE) ×2
GLOVE SURG SS PI 7.0 STRL IVOR (GLOVE) ×6 IMPLANT
GOWN STRL NON-REIN LRG LVL3 (GOWN DISPOSABLE) ×3 IMPLANT
GOWN STRL REIN XL XLG (GOWN DISPOSABLE) ×6 IMPLANT
HEMOSTAT SURGICEL 2X14 (HEMOSTASIS) ×3 IMPLANT
KIT BASIN OR (CUSTOM PROCEDURE TRAY) ×3 IMPLANT
NS IRRIG 1000ML POUR BTL (IV SOLUTION) ×3 IMPLANT
PACK BASIC VI WITH GOWN DISP (CUSTOM PROCEDURE TRAY) ×3 IMPLANT
PENCIL BUTTON HOLSTER BLD 10FT (ELECTRODE) ×3 IMPLANT
SHEARS HARMONIC 9CM CVD (BLADE) ×3 IMPLANT
SPONGE GAUZE 4X4 12PLY (GAUZE/BANDAGES/DRESSINGS) ×3 IMPLANT
STAPLER VISISTAT 35W (STAPLE) ×3 IMPLANT
STRIP CLOSURE SKIN 1/2X4 (GAUZE/BANDAGES/DRESSINGS) ×3 IMPLANT
SUT MNCRL AB 4-0 PS2 18 (SUTURE) ×3 IMPLANT
SUT SILK 2 0 (SUTURE) ×1
SUT SILK 2 0 SH (SUTURE) IMPLANT
SUT SILK 2-0 18XBRD TIE 12 (SUTURE) ×2 IMPLANT
SUT SILK 3 0 (SUTURE)
SUT SILK 3-0 18XBRD TIE 12 (SUTURE) IMPLANT
SUT VIC AB 4-0 SH 18 (SUTURE) ×6 IMPLANT
SUT VIC AB 5-0 P-3 18XBRD (SUTURE) IMPLANT
SUT VIC AB 5-0 P3 18 (SUTURE)
SYR BULB IRRIGATION 50ML (SYRINGE) ×3 IMPLANT
TAPE CLOTH SURG 4X10 WHT LF (GAUZE/BANDAGES/DRESSINGS) ×3 IMPLANT
YANKAUER SUCT BULB TIP 10FT TU (MISCELLANEOUS) ×3 IMPLANT

## 2012-02-25 NOTE — Op Note (Signed)
Surgeon: Wenda Low, MD, FACS  Asst:  Darnell Level, MD, FACS  Anes:  general  Procedure: Total thyroidectomy  Diagnosis: Grave's disease  Complications: none  EBL:   5  cc  Description of Procedure:  Taken to OR 11.  General anesthesia, supine with roll between shoulders, prepped with PCMX and draped.  Timeout performed.  Transverse incision and subplatysmal flaps raised.  Mehorner retractor inserted.  Midline identified.  Strap muscles retracted and the left lobe mobilized first.  Stayed on the gland and mobilized the superior pole, inferior pole and laterally using clips, Bovie and Harmonic scalpel.  Gland swept to the midline.  The right lobe was similarly mobilized.  Recurrent nerves were avoided by staying right on the gland and preserving the vascular supply to the parathyroids. Hemostasis maintained.  Gland excised in toto.  Sent for permanent sections.  Closed with 4-0 vicryl subcut and subcuticular 5-0 monocryl, with benzoin and steristrips.    Debra B. Daphine Deutscher, MD, [facility], [place] [phone]

## 2012-02-25 NOTE — Anesthesia Preprocedure Evaluation (Addendum)
Anesthesia Evaluation  Patient identified by MRN, date of birth, ID band Patient awake    Reviewed: Allergy & Precautions, H&P , NPO status , Patient's Chart, lab work & pertinent test results, reviewed documented beta blocker date and time   History of Anesthesia Complications Negative for: history of anesthetic complications  Airway Mallampati: III TM Distance: >3 FB Neck ROM: Full    Dental  (+) Caps, Partial Upper and Partial Lower   Pulmonary neg pulmonary ROS,  breath sounds clear to auscultation  Pulmonary exam normal       Cardiovascular hypertension, Pt. on medications and Pt. on home beta blockers Rhythm:Regular Rate:Normal     Neuro/Psych negative neurological ROS  negative psych ROS   GI/Hepatic negative GI ROS, Neg liver ROS,   Endo/Other  Hyperthyroidism   Renal/GU negative Renal ROS     Musculoskeletal negative musculoskeletal ROS (+)   Abdominal   Peds  Hematology  (+) Blood dyscrasia, anemia ,   Anesthesia Other Findings   Reproductive/Obstetrics                          Anesthesia Physical Anesthesia Plan  ASA: II  Anesthesia Plan: General   Post-op Pain Management:    Induction: Intravenous  Airway Management Planned: Oral ETT  Additional Equipment:   Intra-op Plan:   Post-operative Plan: Extubation in OR  Informed Consent: I have reviewed the patients History and Physical, chart, labs and discussed the procedure including the risks, benefits and alternatives for the proposed anesthesia with the patient or authorized representative who has indicated his/her understanding and acceptance.   Dental advisory given  Plan Discussed with: CRNA and Surgeon  Anesthesia Plan Comments:        Anesthesia Quick Evaluation

## 2012-02-25 NOTE — Anesthesia Postprocedure Evaluation (Signed)
Anesthesia Post Note  Patient: Debra Huber  Procedure(s) Performed: Procedure(s) (LRB): THYROIDECTOMY ()  Anesthesia type: General  Patient location: PACU  Post pain: Pain level controlled  Post assessment: Post-op Vital signs reviewed  Last Vitals: BP 144/86  Pulse 66  Temp 36.7 C (Oral)  Resp 12  SpO2 96%  Post vital signs: Reviewed  Level of consciousness: sedated  Complications: No apparent anesthesia complications

## 2012-02-25 NOTE — Transfer of Care (Signed)
Immediate Anesthesia Transfer of Care Note  Patient: Debra Huber  Procedure(s) Performed: Procedure(s) (LRB) with comments: THYROIDECTOMY ()  Patient Location: PACU  Anesthesia Type: General  Level of Consciousness: awake, oriented and patient cooperative  Airway & Oxygen Therapy: Patient Spontanous Breathing and Patient connected to face mask oxygen  Post-op Assessment: Report given to PACU RN, Post -op Vital signs reviewed and stable and Patient moving all extremities X 4  Post vital signs: Reviewed and stable  Complications: No apparent anesthesia complications

## 2012-02-25 NOTE — Preoperative (Signed)
Beta Blockers   Reason not to administer Beta Blockers:Took Metoprolol this am. 

## 2012-02-25 NOTE — H&P (Addendum)
Chief Complaint: Grave's disease not medically compliant  History of Present Illness: Debra Huber is an 61 y.o. female who was seen by me last April to discuss surgery. At that time I discussed radioactive iodine ablation to avoid surgery but apparently she did not want to do that. At that time explained the issues with subtotal thyroidectomy including potential hypocalcemia and recurrent nerve injury.  She was referred back by Dr. [name] wanting to go in undergo surgery. She's been very frustrated with how the medications make her feel. Specifically she claims a change in her coordination that she says is not related to her vital signs. She has a twin sister who has undergone subtotal thyroidectomy and treating her thyroid disease and she is off medications and doing well.She was to go ahead and schedule surgery. Will give her a week of Lugol solution. She is already on beta blockers but has stopped her Tapazole Past Medical History   Diagnosis  Date   .  Anemia    .  Hypertension    .  Thyroid disease    .  Colon cancer  dx'd 11/2009    Past Surgical History   Procedure  Date   .  Colectomy for IIIa colon cancer 2011 in [place]    partial    Current Outpatient Prescriptions   Medication  Sig  Dispense  Refill   .  amitriptyline (ELAVIL) 25 MG tablet  Take 25 mg by mouth at bedtime.     Marland Kitchen  aspirin EC 81 MG tablet  Take 81 mg by mouth as needed.     .  cloNIDine (CATAPRES) 0.3 MG tablet  Take 0.3 mg by mouth 2 (two) times daily.     .  Coenzyme Q10 (COQ10 PO)  Take by mouth daily.     .  hydrochlorothiazide (HYDRODIURIL) 25 MG tablet  Take 1 tablet (25 mg total) by mouth daily.  30 tablet  0   .  Iodine, Kelp, (KELP PO)  Take by mouth daily.     Marland Kitchen  levothyroxine (SYNTHROID) 25 MCG tablet  Take 25 mcg by mouth daily.     Marland Kitchen  lisinopril (PRINIVIL,ZESTRIL) 20 MG tablet  Take 1 tablet (20 mg total) by mouth daily.  30 tablet  0   .  methimazole (TAPAZOLE) 10 MG tablet  Take 10 mg by mouth  daily.     .  metoprolol succinate (TOPROL-XL) 25 MG 24 hr tablet  Take 1 tablet (25 mg total) by mouth daily.  30 tablet  0   .  Pediatric Multiple Vitamins (CHEWABLE MULTIPLE VITAMINS PO)  Take by mouth daily.     .  vitamin E (VITAMIN E) 1000 UNIT capsule  Take 1,000 Units by mouth daily.     Penicillins  No family history on file.  Social History: reports that she has never smoked. She does not have any smokeless tobacco history on file. She reports that she does not drink alcohol. Her drug history not on file.  REVIEW OF SYSTEMS - PERTINENT POSITIVES ONLY:  noncontributory  Physical Exam:  Blood pressure 142/98, pulse 64, temperature 96.6 F (35.9 C), temperature source Temporal, resp. rate 16, height 5\' 7"  (1.702 m), weight 154 lb 2 oz (69.911 kg).  Body mass index is 24.14 kg/(m^2).  Gen: WDWN female NAD  Neurological: Alert and oriented to person, place, and time. Motor and sensory function is grossly intact  Head: Normocephalic and atraumatic.  Eyes: Conjunctivae are normal. Pupils are  equal, round, and reactive to light. No scleral icterus. Mild exophtalmos  Neck: Normal range of motion. Neck supple. No tracheal deviation . Palpable thyroid consistent with goiter Cardiovascular: SR without murmurs or gallops. No carotid bruits. Rate controlled by beta blocker  Respiratory: Effort normal. No respiratory distress. No chest wall tenderness. Breath sounds normal. No wheezes, rales or rhonchi.  Abdomen: nontender  GU:  Musculoskeletal: Normal range of motion. Extremities are nontender. No cyanosis, edema or clubbing noted Lymphadenopathy: No cervical, preauricular, postauricular or axillary adenopathy is present Skin: Skin is warm and dry. No rash noted. No diaphoresis. No erythema. No pallor. Pscyh: Normal mood and affect. Behavior is normal. Judgment and thought content normal.  LABORATORY RESULTS:  No results found for this or any previous visit (from the past 48 hour(s)).    RADIOLOGY RESULTS:  No results found.  Problem List:  Patient Active Problem List   Diagnosis   .  Malignant neoplasm of colon, unspecified site   .  Thyroid disease   .  Hypertension   Assessment & Plan:  History of Graves disease wanting to undergo surgery to minimize medications. She is currently followed by Dr. [name], Dr. Meriel Pica,  and Dr. Ardyth Harps. She has taken her Lugol's iodine.  I answered more of her questions in the holding area.    There has been no change in the patient's past medical history or physical exam in the past 24 hours to the best of my knowledge. I examined the patient in the holding area and have made any changes to the history and physical exam report that is included above.   Expectations and outcome results have been discussed with the patient to include risks and benefits.  All questions have been answered and we will proceed with previously discussed procedure noted and signed in the consent form in the patient's record.    Jonne Rote BMD FACS 7:35 AM  02/25/2012 Matt B. Daphine Deutscher, MD, [facility]  [phone] beeper  [phone]

## 2012-02-26 ENCOUNTER — Encounter (HOSPITAL_COMMUNITY)
Admission: RE | Disposition: A | Discharge status: Home / Self Care | Payer: Self-pay | Source: Ambulatory Visit | Attending: Surgery

## 2012-02-26 ENCOUNTER — Ambulatory Visit (HOSPITAL_COMMUNITY): Payer: Medicare Other | Admitting: Anesthesiology

## 2012-02-26 ENCOUNTER — Encounter (HOSPITAL_COMMUNITY): Payer: Self-pay | Admitting: Anesthesiology

## 2012-02-26 DIAGNOSIS — I10 Essential (primary) hypertension: Secondary | ICD-10-CM | POA: Diagnosis present

## 2012-02-26 DIAGNOSIS — C73 Malignant neoplasm of thyroid gland: Secondary | ICD-10-CM | POA: Diagnosis not present

## 2012-02-26 DIAGNOSIS — IMO0002 Reserved for concepts with insufficient information to code with codable children: Secondary | ICD-10-CM | POA: Diagnosis not present

## 2012-02-26 DIAGNOSIS — D62 Acute posthemorrhagic anemia: Secondary | ICD-10-CM | POA: Diagnosis not present

## 2012-02-26 DIAGNOSIS — E0500 Thyrotoxicosis with diffuse goiter without thyrotoxic crisis or storm: Secondary | ICD-10-CM | POA: Diagnosis present

## 2012-02-26 DIAGNOSIS — Z01812 Encounter for preprocedural laboratory examination: Secondary | ICD-10-CM | POA: Diagnosis not present

## 2012-02-26 HISTORY — PX: HEMATOMA EVACUATION: SHX5118

## 2012-02-26 LAB — CBC
HCT: 36.4 % (ref 36.0–46.0)
MCHC: 33.8 g/dL (ref 30.0–36.0)
MCV: 83.5 fL (ref 78.0–100.0)
RDW: 14.3 % (ref 11.5–15.5)

## 2012-02-26 LAB — BASIC METABOLIC PANEL
BUN: 8 mg/dL (ref 6–23)
Chloride: 97 mEq/L (ref 96–112)
Creatinine, Ser: 0.57 mg/dL (ref 0.50–1.10)
GFR calc Af Amer: >90 mL/min (ref >90)
GFR calc non Af Amer: >90 mL/min (ref >90)

## 2012-02-26 LAB — PROTIME-INR: Prothrombin Time: 13.4 seconds (ref 11.6–15.2)

## 2012-02-26 SURGERY — EVACUATION HEMATOMA
Anesthesia: General | Site: Neck | Wound class: Clean Contaminated

## 2012-02-26 MED ORDER — OXYCODONE HCL 5 MG/5ML PO SOLN: 5.0000 mg | Freq: Once | ORAL | Status: AC | PRN

## 2012-02-26 MED ORDER — CEFAZOLIN SODIUM 1-5 GM-% IV SOLN
1.0000 g | Freq: Three times a day (TID) | INTRAVENOUS | Status: AC
  Administered 2012-02-26 – 2012-02-27 (×3): 1 g via INTRAVENOUS
  Filled 2012-02-26 (×5): qty 50

## 2012-02-26 MED ORDER — PROMETHAZINE HCL 25 MG/ML IJ SOLN: 6.2500 mg | INTRAMUSCULAR | Status: DC | PRN

## 2012-02-26 MED ORDER — CEFAZOLIN SODIUM-DEXTROSE 2-3 GM-% IV SOLR
INTRAVENOUS | Status: DC | PRN
  Administered 2012-02-26: 2 g via INTRAVENOUS

## 2012-02-26 MED ORDER — ONDANSETRON HCL 4 MG/2ML IJ SOLN
INTRAMUSCULAR | Status: DC | PRN
  Administered 2012-02-26: 4 mg via INTRAVENOUS

## 2012-02-26 MED ORDER — PROPOFOL 10 MG/ML IV BOLUS
INTRAVENOUS | Status: DC | PRN
  Administered 2012-02-26: 200 mg via INTRAVENOUS

## 2012-02-26 MED ORDER — SUCCINYLCHOLINE CHLORIDE 20 MG/ML IJ SOLN
INTRAMUSCULAR | Status: DC | PRN
  Administered 2012-02-26: 100 mg via INTRAVENOUS

## 2012-02-26 MED ORDER — ACETAMINOPHEN 10 MG/ML IV SOLN
1000.0000 mg | Freq: Once | INTRAVENOUS | Status: AC | PRN
  Filled 2012-02-26: qty 100

## 2012-02-26 MED ORDER — OXYCODONE HCL 5 MG PO TABS: 5.0000 mg | ORAL_TABLET | Freq: Once | ORAL | Status: AC | PRN

## 2012-02-26 MED ORDER — HYDROMORPHONE HCL PF 1 MG/ML IJ SOLN: 0.2500 mg | INTRAMUSCULAR | Status: DC | PRN

## 2012-02-26 MED ORDER — MIDAZOLAM HCL 5 MG/5ML IJ SOLN
INTRAMUSCULAR | Status: DC | PRN
  Administered 2012-02-26: 2 mg via INTRAVENOUS

## 2012-02-26 MED ORDER — 0.9 % SODIUM CHLORIDE (POUR BTL) OPTIME
TOPICAL | Status: DC | PRN
  Administered 2012-02-26: 1000 mL

## 2012-02-26 MED ORDER — FENTANYL CITRATE 0.05 MG/ML IJ SOLN
INTRAMUSCULAR | Status: DC | PRN
  Administered 2012-02-26: 100 ug via INTRAVENOUS

## 2012-02-26 MED ORDER — MEPERIDINE HCL 50 MG/ML IJ SOLN: 6.2500 mg | INTRAMUSCULAR | Status: DC | PRN

## 2012-02-26 MED ORDER — LACTATED RINGERS IV SOLN: INTRAVENOUS | Status: DC

## 2012-02-26 MED ORDER — LACTATED RINGERS IV SOLN
INTRAVENOUS | Status: DC | PRN
  Administered 2012-02-26: 03:00:00 via INTRAVENOUS

## 2012-02-26 MED ORDER — DEXAMETHASONE SODIUM PHOSPHATE 10 MG/ML IJ SOLN
INTRAMUSCULAR | Status: DC | PRN
  Administered 2012-02-26: 10 mg via INTRAVENOUS

## 2012-02-26 SURGICAL SUPPLY — 24 items
CANISTER SUCTION 2500CC (MISCELLANEOUS) ×2 IMPLANT
DRAIN PENROSE 18X1/4 LTX STRL (WOUND CARE) ×2 IMPLANT
DRAPE PED LAPAROTOMY (DRAPES) ×2 IMPLANT
DRAPE POUCH INSTRU U-SHP 10X18 (DRAPES) ×2 IMPLANT
DRAPE UTILITY 15X26 (DRAPE) ×2 IMPLANT
ELECT REM PT RETURN 9FT ADLT (ELECTROSURGICAL) ×2
ELECTRODE REM PT RTRN 9FT ADLT (ELECTROSURGICAL) ×1 IMPLANT
GLOVE BIOGEL M 8.0 STRL (GLOVE) ×2 IMPLANT
GLOVE BIOGEL PI IND STRL 7.5 (GLOVE) ×1 IMPLANT
GLOVE BIOGEL PI IND STRL 8.5 (GLOVE) ×1 IMPLANT
GLOVE BIOGEL PI INDICATOR 7.5 (GLOVE) ×1
GLOVE BIOGEL PI INDICATOR 8.5 (GLOVE) ×1
GLOVE ECLIPSE 8.5 STRL (GLOVE) ×2 IMPLANT
GOWN STRL REIN 2XL LVL4 (GOWN DISPOSABLE) ×4 IMPLANT
KIT BASIN OR (CUSTOM PROCEDURE TRAY) ×2 IMPLANT
NS IRRIG 1000ML POUR BTL (IV SOLUTION) ×2 IMPLANT
PACK GENERAL/GYN (CUSTOM PROCEDURE TRAY) ×2 IMPLANT
SCRUB TECHNI CARE 4 OZ NO DYE (MISCELLANEOUS) ×2 IMPLANT
SPONGE GAUZE 4X4 12PLY (GAUZE/BANDAGES/DRESSINGS) ×2 IMPLANT
STAPLER SKIN PROX WIDE 3.9 (STAPLE) ×2 IMPLANT
SUT ETHILON 4 0 PS 2 18 (SUTURE) ×2 IMPLANT
SUT VIC AB 3-0 SH 8-18 (SUTURE) ×2 IMPLANT
TAPE CLOTH SURG 4X10 WHT LF (GAUZE/BANDAGES/DRESSINGS) ×2 IMPLANT
TOWEL NATURAL 10PK STERILE (DISPOSABLE) ×2 IMPLANT

## 2012-02-26 NOTE — Progress Notes (Addendum)
Called to room 1536 per [facility] [phone]. Pt s/p thyroidectomy 9/13 AM per Dr Daphine Deutscher. Pt complaining of difficulty swallowing and swelling of neck around midnight. Rapid Response called at 0150. RN Eulah Citizen encouraged to call Dr Daphine Deutscher while I was en route to the room. Upon my arrival Dr Daphine Deutscher on the phone with Audery Amel. Updated on pt status per Eulah Citizen and myself via telephone. Pt found awake, anxious oriented X 4, complains of tightness and pressure around entire neck with  inability to swallow. Complains of pain around incision to touch only. Incision site assessed, minimal redness at incision, hard firm swelling around entire neck, and chin. Oral cavity assessed with flashlight and tongue blade swollen and dry, tongue normal size. Pt denied sob po2 95-100% on 2 LNC, RR 18-25. SB/SR 55-65, BP elevated. Dr Daphine Deutscher at bedside 0215. Incision prepped with betadine, opened at the midline and evacuated with hemostat per MD.  Pt admits to pressure relief after evacuation of blood collection at bedside. MD to do complete evacuation in OR. Pt transferred to OR 0245 report given to [facility] in OR holding about recent events.

## 2012-02-26 NOTE — Transfer of Care (Signed)
Immediate Anesthesia Transfer of Care Note  Patient: Debra Huber  Procedure(s) Performed: Procedure(s) (LRB) with comments: EVACUATION HEMATOMA (N/A) - irrigation and drainage of hematoma and placement of drain.  Patient Location: PACU  Anesthesia Type: General  Level of Consciousness: awake, alert , oriented and patient cooperative  Airway & Oxygen Therapy: Patient Spontanous Breathing and Patient connected to face mask oxygen  Post-op Assessment: Report given to PACU RN and Post -op Vital signs reviewed and stable  Post vital signs: Reviewed and stable  Complications: No apparent anesthesia complications

## 2012-02-26 NOTE — Progress Notes (Signed)
Patient ID: Debra Huber, female   DOB: 1951-04-27, 61 y.o.   MRN: [id number] [facility] Progress Note:   Day of Surgery  Subjective: Mental status is clear. Objective: Vital signs in last 24 hours: Temp:  [97.5 F (36.4 C)-98.9 F (37.2 C)] 98.4 F (36.9 C) (09/14 0800) Pulse Rate:  [1-118] 57  (09/14 0800) Resp:  [12-22] 13  (09/14 0800) BP: (119-202)/(68-115) 124/84 mmHg (09/14 0800) SpO2:  [96 %-100 %] 98 % (09/14 0800) Weight:  [155 lb 10.7 oz (70.61 kg)-164 lb 3.9 oz (74.5 kg)] 164 lb 3.9 oz (74.5 kg) (09/14 0515)  Intake/Output from previous day: 09/13 0701 - 09/14 0700 In: 3650 [P.O.:290; I.V.:3360] Out: 2350 [Urine:2100; Blood:250] Intake/Output this shift:    Physical Exam: Work of breathing is  Not labored.  Dressing changed and moderate bloody drainage on 4x4s.  Changed.  No expanding hematoma.  HG 12.6.  INR ok.  Calcium 8.7 Taking po ok.    Lab Results:  Results for orders placed during the hospital encounter of 02/25/12 (from the past 48 hour(s))  CBC     Status: Abnormal   Collection Time   02/25/12  1:10 PM      Component Value Range Comment   WBC 11.8 (*) 4.0 - 10.5 K/uL    RBC 4.51  3.87 - 5.11 MIL/uL    Hemoglobin 12.6  12.0 - 15.0 g/dL    HCT 78.2  95.6 - 21.3 %    MCV 83.4  78.0 - 100.0 fL    MCH 27.9  26.0 - 34.0 pg    MCHC 33.5  30.0 - 36.0 g/dL    RDW 08.6  57.8 - 46.9 %    Platelets 273  150 - 400 K/uL   CREATININE, SERUM     Status: Normal   Collection Time   02/25/12  1:10 PM      Component Value Range Comment   Creatinine, Ser 0.51  0.50 - 1.10 mg/dL    GFR calc non Af Amer >90  >90 mL/min    GFR calc Af Amer >90  >90 mL/min   CBC     Status: Abnormal   Collection Time   02/26/12  5:20 AM      Component Value Range Comment   WBC 12.1 (*) 4.0 - 10.5 K/uL    RBC 4.36  3.87 - 5.11 MIL/uL    Hemoglobin 12.3  12.0 - 15.0 g/dL    HCT 62.9  52.8 - 41.3 %    MCV 83.5  78.0 - 100.0 fL    MCH 28.2  26.0 - 34.0 pg    MCHC 33.8   30.0 - 36.0 g/dL    RDW 24.4  01.0 - 27.2 %    Platelets 285  150 - 400 K/uL   BASIC METABOLIC PANEL     Status: Abnormal   Collection Time   02/26/12  5:20 AM      Component Value Range Comment   Sodium 136  135 - 145 mEq/L    Potassium 3.4 (*) 3.5 - 5.1 mEq/L    Chloride 97  96 - 112 mEq/L    CO2 29  19 - 32 mEq/L    Glucose, Bld 144 (*) 70 - 99 mg/dL    BUN 8  6 - 23 mg/dL    Creatinine, Ser 5.36  0.50 - 1.10 mg/dL    Calcium 8.7  8.4 - 64.4 mg/dL    GFR calc non Af Amer >  90  >90 mL/min    GFR calc Af Amer >90  >90 mL/min   PROTIME-INR     Status: Normal   Collection Time   02/26/12  6:30 AM      Component Value Range Comment   Prothrombin Time 13.4  11.6 - 15.2 seconds    INR 1.00  0.00 - 1.49     Radiology/Results: No results found.  Anti-infectives: Anti-infectives     Start     Dose/Rate Route Frequency Ordered Stop   02/26/12 1130   ceFAZolin (ANCEF) IVPB 1 g/50 mL premix        1 g 100 mL/hr over 30 Minutes Intravenous Every 8 hours 02/26/12 0526 02/27/12 1129          Assessment/Plan: Problem List: Patient Active Problem List  Diagnosis  . Malignant neoplasm of colon, unspecified site  . Thyroid disease  . Hypertension    Will  Transfer to floor.  Observe  Day of Surgery    LOS: 1 day   Matt B. Daphine Deutscher, MD, [facility] [phone] beeper [phone]  02/26/2012 9:58 AM

## 2012-02-26 NOTE — Anesthesia Preprocedure Evaluation (Addendum)
Anesthesia Evaluation  Patient identified by MRN, date of birth, ID band Patient awake    Reviewed: Allergy & Precautions, H&P , NPO status , Patient's Chart, lab work & pertinent test results, reviewed documented beta blocker date and time   History of Anesthesia Complications Negative for: history of anesthetic complications  Airway Mallampati: III TM Distance: >3 FB Neck ROM: Full   Comment: Expanding neck hematoma. Some dysphonia from likely tracheal compression Dental  (+) Caps, Partial Upper and Partial Lower   Pulmonary neg pulmonary ROS,  breath sounds clear to auscultation  Pulmonary exam normal       Cardiovascular hypertension, Pt. on medications and Pt. on home beta blockers Rhythm:Regular Rate:Normal     Neuro/Psych negative neurological ROS  negative psych ROS   GI/Hepatic negative GI ROS, Neg liver ROS,   Endo/Other  Hyperthyroidism   Renal/GU negative Renal ROS     Musculoskeletal negative musculoskeletal ROS (+)   Abdominal   Peds  Hematology  (+) Blood dyscrasia, anemia ,   Anesthesia Other Findings   Reproductive/Obstetrics                          Anesthesia Physical  Anesthesia Plan  ASA: III and Emergent  Anesthesia Plan: General   Post-op Pain Management:    Induction: Intravenous and Rapid sequence  Airway Management Planned: Oral ETT  Additional Equipment:   Intra-op Plan:   Post-operative Plan: Extubation in OR  Informed Consent: I have reviewed the patients History and Physical, chart, labs and discussed the procedure including the risks, benefits and alternatives for the proposed anesthesia with the patient or authorized representative who has indicated his/her understanding and acceptance.   Dental advisory given  Plan Discussed with: CRNA and Surgeon  Anesthesia Plan Comments:         Anesthesia Quick Evaluation

## 2012-02-26 NOTE — Anesthesia Postprocedure Evaluation (Signed)
Anesthesia Post Note  Patient: Debra Huber  Procedure(s) Performed: Procedure(s) (LRB): EVACUATION HEMATOMA (N/A)  Anesthesia type: General  Patient location: PACU  Post pain: Pain level controlled  Post assessment: Post-op Vital signs reviewed  Last Vitals: BP 166/86  Pulse 55  Temp 37.1 C (Oral)  Resp 12  Ht 5\' 7"  (1.702 m)  Wt 155 lb 10.7 oz (70.61 kg)  BMI 24.38 kg/m2  SpO2 100%  Post vital signs: Reviewed  Level of consciousness: sedated  Complications: No apparent anesthesia complications

## 2012-02-26 NOTE — Progress Notes (Signed)
Patient ID: Debra Huber, female   DOB: 1951-03-30, 61 y.o.   MRN: [id number] [facility] Progress Note:   1 Day Post-Op  Subjective: Mental status is clear.  Called to see patient for difficulty swallowing.   Objective: Vital signs in last 24 hours: Temp:  [97.1 F (36.2 C)-98.9 F (37.2 C)] 97.9 F (36.6 C) (09/14 0150) Pulse Rate:  [1-118] 54  (09/14 0150) Resp:  [12-22] 22  (09/14 0150) BP: (108-202)/(68-115) 201/108 mmHg (09/14 0150) SpO2:  [96 %-100 %] 99 % (09/14 0150) Weight:  [155 lb 10.7 oz (70.61 kg)] 155 lb 10.7 oz (70.61 kg) (09/13 1237)  Intake/Output from previous day: 09/13 0701 - 09/14 0700 In: 2425 [P.O.:290; I.V.:2135] Out: 1050 [Urine:1000; Blood:50] Intake/Output this shift: Total I/O In: 240 [P.O.:240] Out: 400 [Urine:400]  Physical Exam: Work of breathing is  Not increased.  O2 sat on room air 99%.  Incision had been closed with running subcuticular and steristrips.  Wound firm and swollen laterally.    Lab Results:  Results for orders placed during the hospital encounter of 02/25/12 (from the past 48 hour(s))  CBC     Status: Abnormal   Collection Time   02/25/12  1:10 PM      Component Value Range Comment   WBC 11.8 (*) 4.0 - 10.5 K/uL    RBC 4.51  3.87 - 5.11 MIL/uL    Hemoglobin 12.6  12.0 - 15.0 g/dL    HCT 40.9  81.1 - 91.4 %    MCV 83.4  78.0 - 100.0 fL    MCH 27.9  26.0 - 34.0 pg    MCHC 33.5  30.0 - 36.0 g/dL    RDW 78.2  95.6 - 21.3 %    Platelets 273  150 - 400 K/uL   CREATININE, SERUM     Status: Normal   Collection Time   02/25/12  1:10 PM      Component Value Range Comment   Creatinine, Ser 0.51  0.50 - 1.10 mg/dL    GFR calc non Af Amer >90  >90 mL/min    GFR calc Af Amer >90  >90 mL/min     Radiology/Results: No results found.  Anti-infectives: Anti-infectives    None      Assessment/Plan: Problem List: Patient Active Problem List  Diagnosis  . Malignant neoplasm of colon, unspecified site  . Thyroid  disease  . Hypertension    Incision prepped with betadine and opened in the midline and evacuated with hemostat.  Symptomatically improved but need to completely evacuate in the OR.  Explained need for return to the patient.  Patient received heparin at 10 pm and these symptoms began around midnight.   1 Day Post-Op    LOS: 1 day   Matt B. Daphine Deutscher, MD, [facility] [phone] beeper [phone]  02/26/2012 2:30 AM

## 2012-02-26 NOTE — Op Note (Signed)
Surgeon: Wenda Low, MD, FACS  Asst:  none  Anes:  General endotracheal by glidescope (cords OK)  Procedure: Open neck wound and evacuation of hematoma, placement of drains and reclosure  Diagnosis: Postoperative hemorrhage in wound  Complications: none  EBL:   No fresh bleeding noted   Description of Procedure:  Brought down from 1536 to evacuate hematoma.  Informed consent gained from patient.  Taken to OR 1 where she was intubated, prepped and draped.  Timeout performed.  Subcuticular closure removed and subcutaneous vicryls removed.  Clots on both side anterior to strap muscles;  Strap muscles separated and clots removed from both sides of neck.  Irrigated with saline.  No active bleeding noted.  1/4 in penrose drain was fenestrated and placed on both sides deep to the strap muscles.  Straps reapproximated more loosely and platysma were closed with vicryls.  Staples on skin and drains secured with 4-0 nylon.  Sterile dressing applied.  Will hold heparin post op and give extra doses of Ancef.    Matt B. Daphine Deutscher, MD, [facility], [place] [phone]

## 2012-02-26 NOTE — Preoperative (Signed)
Beta Blockers   Reason not to administer Beta Blockers:Not Applicable 

## 2012-02-26 NOTE — Progress Notes (Signed)
Patient complained of dressing on her neck being too tight. Noted increased swelling on patient's neck ice pack applied and patient agreed to keep it on. Oxygen on @ 2 liters applied.  Patient then complained of difficulty swallowing and noted persistent swelling on her neck. Assessed vital signs again with changes. Rapid Response was called and Dr Daphine Deutscher, Molli Hazard was notified.  Dr Daphine Deutscher came to the patient's beside for further evaluation.

## 2012-02-27 LAB — CBC
MCH: 27.9 pg (ref 26.0–34.0)
MCHC: 33.2 g/dL (ref 30.0–36.0)
MCV: 84 fL (ref 78.0–100.0)
Platelets: 211 10*3/uL (ref 150–400)
RDW: 14.5 % (ref 11.5–15.5)
WBC: 5.9 10*3/uL (ref 4.0–10.5)

## 2012-02-27 LAB — BASIC METABOLIC PANEL
Calcium: 8.9 mg/dL (ref 8.4–10.5)
Creatinine, Ser: 0.6 mg/dL (ref 0.50–1.10)
GFR calc Af Amer: >90 mL/min (ref >90)
GFR calc non Af Amer: >90 mL/min (ref >90)

## 2012-02-27 NOTE — Progress Notes (Signed)
1 Day Post-Op  Subjective: Swallowing okay.  Walking  Objective: Vital signs in last 24 hours: Temp:  [97.7 F (36.5 C)-98.6 F (37 C)] 98.6 F (37 C) (09/15 0520) Pulse Rate:  [36-71] 52  (09/15 0520) Resp:  [13-21] 18  (09/15 0520) BP: (119-171)/(56-95) 137/73 mmHg (09/15 0520) SpO2:  [92 %-100 %] 97 % (09/15 0520) Last BM Date: 02/26/12  Intake/Output from previous day: 09/14 0701 - 09/15 0700 In: 2063 [P.O.:338; I.V.:1725] Out: 1750 [Urine:1750] Intake/Output this shift:    PE: Neck-bloody drainage on gauze, some swelling present  Lab Results:   Basename 02/27/12 0426 02/26/12 0520  WBC 5.9 12.1*  HGB 9.5* 12.3  HCT 28.3* 36.4  PLT 211 285   BMET  Basename 02/27/12 0426 02/26/12 0520  NA 137 136  K 3.7 3.4*  CL 102 97  CO2 28 29  GLUCOSE 101* 144*  BUN 8 8  CREATININE 0.60 0.57  CALCIUM 8.9 8.7   PT/INR  Basename 02/26/12 0630  LABPROT 13.4  INR 1.00   Comprehensive Metabolic Panel:    Component Value Date/Time   NA 137 02/27/2012 0426   NA 138 10/20/2011 0824   K 3.7 02/27/2012 0426   K 3.7 10/20/2011 0824   CL 102 02/27/2012 0426   CL 93* 10/20/2011 0824   CO2 28 02/27/2012 0426   CO2 30 10/20/2011 0824   BUN 8 02/27/2012 0426   BUN 16 10/20/2011 0824   CREATININE 0.60 02/27/2012 0426   CREATININE 0.7 10/20/2011 0824   GLUCOSE 101* 02/27/2012 0426   GLUCOSE 96 10/20/2011 0824   CALCIUM 8.9 02/27/2012 0426   CALCIUM 9.0 10/20/2011 0824   AST 27 10/20/2011 0824   AST 28 03/05/2011 1440   AST 28 03/05/2011 1440   ALT 15 03/05/2011 1440   ALT 15 03/05/2011 1440   ALKPHOS 70 10/20/2011 0824   ALKPHOS 67 03/05/2011 1440   ALKPHOS 67 03/05/2011 1440   BILITOT 0.50 10/20/2011 0824   BILITOT 0.4 03/05/2011 1440   BILITOT 0.4 03/05/2011 1440   PROT 6.7 10/20/2011 0824   PROT 7.0 03/05/2011 1440   PROT 7.0 03/05/2011 1440   ALBUMIN 4.4 03/05/2011 1440   ALBUMIN 4.4 03/05/2011 1440     Studies/Results: No results found.  Anti-infectives: Anti-infectives     Start      Dose/Rate Route Frequency Ordered Stop   02/26/12 1130   ceFAZolin (ANCEF) IVPB 1 g/50 mL premix        1 g 100 mL/hr over 30 Minutes Intravenous Every 8 hours 02/26/12 0526 02/27/12 0347          Assessment Active Problems: Graves disease s/p total thyroidectomy and take back for bleeding-still having some bloody drainage ABL anemia-hemoglobin 9.5 today    LOS: 2 days   Plan: Continue observation.  Recheck hemoglobin 9/16   Debra Huber J 02/27/2012

## 2012-02-28 ENCOUNTER — Encounter (HOSPITAL_COMMUNITY): Payer: Self-pay | Admitting: Surgery

## 2012-02-28 LAB — CBC
Platelets: 211 10*3/uL (ref 150–400)
RDW: 14.3 % (ref 11.5–15.5)
WBC: 5.7 10*3/uL (ref 4.0–10.5)

## 2012-02-28 LAB — BASIC METABOLIC PANEL
Chloride: 100 mEq/L (ref 96–112)
Creatinine, Ser: 0.61 mg/dL (ref 0.50–1.10)
GFR calc Af Amer: >90 mL/min (ref >90)
GFR calc non Af Amer: >90 mL/min (ref >90)
Potassium: 3.7 mEq/L (ref 3.5–5.1)

## 2012-02-28 MED ORDER — OXYCODONE-ACETAMINOPHEN 5-325 MG PO TABS: 1.0000 | ORAL_TABLET | ORAL | Status: AC | PRN

## 2012-02-28 NOTE — Discharge Summary (Signed)
Physician Discharge Summary  Patient ID: Debra Huber MRN: [id number] DOB/AGE: 1951-01-30 61 y.o.  Admit date: 02/25/2012 Discharge date: 02/28/2012  Admission Diagnoses:  Hyperthyroidism (Graves's disease)  Discharge Diagnoses:  same  Active Problems:  * No active hospital problems. *    Surgery:  Total thyroidectomy;  Exploration of wound for bleeding   Discharged Condition: improved  Hospital Course:   Pt has total thyroidectomy on Friday, Sept 13, 2013 and initially did very well.  After her 10 pm heparin dose on the evening of surgery she developed a neck hematoma resulting in her return to the OR for evacuation and drainage.  She was monitored in stepdown and then on the floor and was ready for discharge on PD 3.  Staples out and wound dressed.  Instructions given to request Synthroid dosage from Dr. Rinaldo Cloud office  Consults: none  Significant Diagnostic Studies: none    Discharge Exam: Blood pressure 149/87, pulse 48, temperature 98.7 F (37.1 C), temperature source Oral, resp. rate 18, height 5\' 7"  (1.702 m), weight 164 lb 3.9 oz (74.5 kg), SpO2 92.00%. Wound drainage had tapered to slight serosanguinous.  Wound not red.  Approximated with steristrips.    Disposition: Final discharge disposition not confirmed  Discharge Orders    Future Orders Please Complete By Expires   Diet - low sodium heart healthy      Increase activity slowly      Discharge instructions      Comments:   Contact Dr. Ardyth Harps about the dosage of Synthroid that he wants you to take Take TUMS as you have been in the hospital May shower and get incision wet tomorrow   Discharge wound care:      Comments:   Shower tomorrow Apply Neosporin daily to the incision   Call MD for:  redness, tenderness, or signs of infection (pain, swelling, redness, odor or green/yellow discharge around incision site)      Remove dressing in 24 hours          Medication List     As of 02/28/2012  9:09 AM      STOP taking these medications         Iodine Strong (Lugols) 5 % Soln solution      KELP PO      methimazole 10 MG tablet   Commonly known as: TAPAZOLE      TAKE these medications         amitriptyline 25 MG tablet   Commonly known as: ELAVIL   Take 25 mg by mouth at bedtime.      aspirin EC 81 MG tablet   Take 81 mg by mouth daily.      Centrum Silver Chew   Chew 1 tablet by mouth daily.      cloNIDine 0.3 MG tablet   Commonly known as: CATAPRES   Take 0.3 mg by mouth 2 (two) times daily.      hydrochlorothiazide 25 MG tablet   Commonly known as: HYDRODIURIL   Take 25 mg by mouth daily with breakfast.      lisinopril 20 MG tablet   Commonly known as: PRINIVIL,ZESTRIL   Take 20 mg by mouth daily with breakfast.      metoprolol succinate 25 MG 24 hr tablet   Commonly known as: TOPROL-XL   Take 25 mg by mouth daily with breakfast.      oxyCODONE-acetaminophen 5-325 MG per tablet   Commonly known as: PERCOCET/ROXICET   Take 1-2 tablets by  mouth every 4 (four) hours as needed.      vitamin E 1000 UNIT capsule   Generic drug: vitamin E   Take 1,000 Units by mouth daily.           Follow-up Information    Follow up with Luretha Murphy B, MD. Schedule an appointment as soon as possible for a visit in 4 weeks.   Contact information:   [address] [place] [address] [phone]       Follow up with Julian Hy, MD. Schedule an appointment as soon as possible for a visit in 1 week. (get dosage of synthroid for maintenance dose)    Contact information:   [address] [place] [address] [phone]          Signed: Valarie Merino 02/28/2012, 9:09 AM

## 2012-02-28 NOTE — Progress Notes (Signed)
Pt's dressing was removed. Pt stated she was brushing her teeth when her dressing became loose and fell off". Drain and skin intact. Recovered the incision with 4x4s and tape.  Dorris Fetch, RN.

## 2012-03-03 ENCOUNTER — Ambulatory Visit: Payer: Medicare Other

## 2012-03-08 ENCOUNTER — Encounter: Payer: Self-pay | Admitting: Emergency Medicine

## 2012-03-08 ENCOUNTER — Other Ambulatory Visit: Payer: Self-pay | Admitting: Emergency Medicine

## 2012-03-08 ENCOUNTER — Ambulatory Visit (INDEPENDENT_AMBULATORY_CARE_PROVIDER_SITE_OTHER): Payer: Medicare Other | Admitting: Emergency Medicine

## 2012-03-08 VITALS — BP 144/88 | HR 84 | Temp 97.6°F | Resp 16 | Ht 66.5 in | Wt 153.0 lb

## 2012-03-08 DIAGNOSIS — H543 Unqualified visual loss, both eyes: Secondary | ICD-10-CM | POA: Diagnosis not present

## 2012-03-08 DIAGNOSIS — E785 Hyperlipidemia, unspecified: Secondary | ICD-10-CM | POA: Diagnosis not present

## 2012-03-08 DIAGNOSIS — H547 Unspecified visual loss: Secondary | ICD-10-CM

## 2012-03-08 DIAGNOSIS — C73 Malignant neoplasm of thyroid gland: Secondary | ICD-10-CM

## 2012-03-08 DIAGNOSIS — D490 Neoplasm of unspecified behavior of digestive system: Secondary | ICD-10-CM | POA: Diagnosis not present

## 2012-03-08 DIAGNOSIS — Z139 Encounter for screening, unspecified: Secondary | ICD-10-CM

## 2012-03-08 DIAGNOSIS — C189 Malignant neoplasm of colon, unspecified: Secondary | ICD-10-CM

## 2012-03-08 DIAGNOSIS — Z131 Encounter for screening for diabetes mellitus: Secondary | ICD-10-CM

## 2012-03-08 LAB — COMPREHENSIVE METABOLIC PANEL
ALT: 13 U/L (ref 0–35)
AST: 26 U/L (ref 0–37)
CO2: 32 mEq/L (ref 19–32)
Calcium: 9.5 mg/dL (ref 8.4–10.5)
Chloride: 97 mEq/L (ref 96–112)
Creat: 0.69 mg/dL (ref 0.50–1.10)
Sodium: 137 mEq/L (ref 135–145)
Total Bilirubin: 0.5 mg/dL (ref 0.3–1.2)
Total Protein: 7.8 g/dL (ref 6.0–8.3)

## 2012-03-08 LAB — POCT CBC
Hemoglobin: 13.2 g/dL (ref 12.2–16.2)
MCH, POC: 27.5 pg (ref 27–31.2)
MID (cbc): 0.4 (ref 0–0.9)
MPV: 9.7 fL (ref 0–99.8)
POC Granulocyte: 3.9 (ref 2–6.9)
POC MID %: 6.5 %M (ref 0–12)
Platelet Count, POC: 551 10*3/uL — AB (ref 142–424)
RBC: 4.8 M/uL (ref 4.04–5.48)

## 2012-03-08 LAB — GLUCOSE, POCT (MANUAL RESULT ENTRY): POC Glucose: 84 mg/dl (ref 70–99)

## 2012-03-08 MED ORDER — LEVOTHYROXINE SODIUM 50 MCG PO TABS: 50.0000 ug | ORAL_TABLET | Freq: Every day | ORAL | Status: DC

## 2012-03-08 NOTE — Progress Notes (Signed)
  Subjective:    Patient ID: Debra Huber, female    DOB: 1950-09-29, 61 y.o.   MRN: [id number]  HPI surgery approximately 10 days ago for an enlarged thyroid. Postop she developed a large hematoma [place] quadrant repeat surgery. 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 She continues to have oozing from her I&D site which was performed at the scar site of the thyroidectomy. She has been stressed out but has not had severe swelling she had before and has not any difficulty breathing. She also states that since this episode she has had difficulty with her vision.    Review of Systems     Objective:   Physical Exam patient is tearful and upset but in no acute distress. Her neck exam reveals an open 1 cm area at the base of the neck which is oozing serosanguineous material. There is prominence around the area where she had a thyroidectomy. Her chest is clear her cardiac exam is unremarkable.  Results for orders placed in visit on 03/08/12  POCT CBC      Component Value Range   WBC 6.5  4.6 - 10.2 K/uL   Lymph, poc 2.2  0.6 - 3.4   POC LYMPH PERCENT 33.5  10 - 50 %L   MID (cbc) 0.4  0 - 0.9   POC MID % 6.5  0 - 12 %M   POC Granulocyte 3.9  2 - 6.9   Granulocyte percent 60.0  37 - 80 %G   RBC 4.80  4.04 - 5.48 M/uL   Hemoglobin 13.2  12.2 - 16.2 g/dL   HCT, POC 40.9  81.1 - 47.9 %   MCV 88.0  80 - 97 fL   MCH, POC 27.5  27 - 31.2 pg   MCHC 31.3 (*) 31.8 - 35.4 g/dL   RDW, POC 91.4     Platelet Count, POC 551 (*) 142 - 424 K/uL   MPV 9.7  0 - 99.8 fL  GLUCOSE, POCT (MANUAL RESULT ENTRY)      Component Value Range   POC Glucose 84  70 - 99 mg/dl   I could not see the disc in the left eye due to a cataract in the right eye there appeared to be signs of macular degeneration but the disc itself appeared normal     Assessment & Plan:  Dr. Lyndle Herrlich office called and  appointment to be made for next week. Dr.Mattt Daphine Deutscher called  Referral to Dr. Dione Booze to evaluate patient's decline in vision postsurgery

## 2012-03-09 ENCOUNTER — Telehealth (INDEPENDENT_AMBULATORY_CARE_PROVIDER_SITE_OTHER): Payer: Self-pay

## 2012-03-09 NOTE — Telephone Encounter (Signed)
Pt called stating as of yesterday she had not heard from her path result and it was given to her by Dr Cleta Alberts. Pt states she is concerned because she did not receive the path result from Dr Daphine Deutscher or our office. I advised pt this concern will be forwarded to Dr Daphine Deutscher and our manager. I asked pt if there is any other concerns. Pt states she is fine and she has upcoming appt with Dr Evlyn Kanner. Pt to call will any concerns or questions.

## 2012-03-14 ENCOUNTER — Telehealth (INDEPENDENT_AMBULATORY_CARE_PROVIDER_SITE_OTHER): Payer: Self-pay | Admitting: General Surgery

## 2012-03-14 NOTE — Telephone Encounter (Signed)
Message copied by Littie Deeds on Tue Mar 14, 2012  3:07 PM ------      Message from: Marlowe Aschoff      Created: Tue Mar 14, 2012  1:21 PM                   ----- Message -----         From: Zacarias Pontes         Sent: 03/13/2012  11:21 AM           To: Marlowe Aschoff, RN            Pt needs 2 week po apt with MM,her call back # is [phone]

## 2012-03-14 NOTE — Telephone Encounter (Signed)
LMOM letting pt know that I have her appt to see Dr. Daphine Deutscher set up for 10/2 at 1:30.

## 2012-03-15 ENCOUNTER — Ambulatory Visit (INDEPENDENT_AMBULATORY_CARE_PROVIDER_SITE_OTHER): Payer: Medicare Other | Admitting: Surgery

## 2012-03-15 ENCOUNTER — Encounter (INDEPENDENT_AMBULATORY_CARE_PROVIDER_SITE_OTHER): Payer: Self-pay | Admitting: Surgery

## 2012-03-15 VITALS — BP 126/80 | HR 70 | Temp 98.6°F | Resp 18 | Ht 66.0 in | Wt 153.4 lb

## 2012-03-15 DIAGNOSIS — Z9889 Other specified postprocedural states: Secondary | ICD-10-CM

## 2012-03-15 NOTE — Patient Instructions (Signed)
Thyroidectomy Thyroidectomy is the removal of part or all of your thyroid gland. Your thyroid gland is a butterfly-shaped gland at the base of your neck. It produces a substance called thyroid hormone, which regulates the physical and chemical processes that keep your body functioning and make energy available to your body (metabolism). The amount of thyroid gland tissue that is removed during a thyroidectomy depends on the reason for the procedure. Typically, if only a part of your gland is removed, enough thyroid gland tissue remains to maintain normal function. If your entire thyroid gland is removed or if the amount of thyroid gland tissue remaining is inadequate to maintain normal function, you will need life-long treatment with thyroid hormone on a daily basis. Thyroidectomy maybe performed when you have the following conditions:  Thyroid nodules. These are small, abnormal collections of tissue that form inside the thyroid gland. If these nodules begin to enlarge at a rapid rate, a sample of tissue from the nodule is taken through a needle and examined (needle biopsy). This is done to determine if the nodules are cancerous. Depending on the outcome of this exam, thyroidectomy may be necessary.  Thyroid cancer.  Goiter, which is an enlarged thyroid gland. All or part of the thyroid gland may be removed if the gland has become so large that it causes difficulty breathing or swallowing.  Hyperthyroidism. This is when the thyroid gland produces too much thyroid hormone. Hypothyroidism can cause symptoms of fluctuating weight, intolerance to heat, irritability, shortness of breath, and chest pain. LET YOUR CAREGIVER KNOW ABOUT:   Allergies to food or medicine.  Medicines that you are taking, including vitamins, herbs, eyedrops, over-the-counter medicines, and creams.  Previous problems you have had with anesthetics or numbing medicines.  History of bleeding problems or blood clots.  Previous  surgeries you have had.  Other health problems, including diabetes and kidney problems, you have had.  Possibility of pregnancy, if this applies. BEFORE THE PROCEDURE   Do not eat or drink anything, including water, for at least 6 hours before the procedure.  Ask your caregiver whether you should stop taking certain medicines before the day of the procedure. PROCEDURE  There are different ways that thyroidectomy is performed. For each type, you will be given a medicine to make you sleep (general anesthetic). The three main types of thyroidectomy are listed as follows:  Conventional thyroidectomy A cut (incision) in the center portion of your lower neck is made with a scalpel. Muscles below your skin are separated to gain access to your thyroid gland. Your thyroid gland is dissected from your windpipe (trachea). Often a drain is placed at the incision site to drain any blood that accumulates under the skin after the procedure. This drain will be removed before you go home. The wound from the incision should heal within 2 weeks.  Endoscopic thyroidectomy Small incisions are made in your lower neck. A small instrument (endoscope) is inserted under your skin at the incision sites. The endoscope used for thyroidectomy consists of 2 flexible tubes. Inside one of the tubes is a video camera that is used to guide the surgeon. Tools to remove the thyroid gland, including a tool to cut the gland (dissectors) and a suction device, are inserted through the other tube. The surgeon uses the dissectors to dissect the thyroid gland from the trachea and remove it.  Robotic thyroidectomy This procedure allows your thyroid gland to be removed through incisions in your armpit, your chest, or high in your   neck. Instruments similar to endoscopes provide a 3-dimensional picture of the surgical site. Dissecting instruments are controlled by devices similar to joysticks. These devices allow more accurate manipulation of the  instruments. After the blood supply to the gland is removed, the gland is cut into several pieces and removed through the incisions. RISKS AND COMPLICATIONS Complications associated with thyroidectomy are rare, but they can occur. Possible complications include:  A decrease in parathyroid hormone levels (hypoparathyroidism) Your parathyroid glands are located close behind your thyroid gland. They are responsible for maintaining calcium levels inthe body. If they are damaged or removed, levels of calcium in the blood become low and nerves become irritable, which can cause muscle spasms. Medicines are available to treat this.  Bacterial infection This can often be treated with medicines that kill bacteria (antibiotics).  Damage to your voice box nerves This could cause hoarseness or complete loss of voice.  Bleeding or airway obstruction. AFTER THE PROCEDURE   You will rest in the recovery room as you wake up.  When you first wake up, your throat may feel slightly sore.  You will not be allowed to eat or drink until instructed otherwise.  You will be taken to your hospital room. You will usually stay at the hospital for 1 or 2 nights.  If a drain is placed during the procedure, it usually is removed the next day.  You may have some mild neck pain.  Your voice may be weak. This usually is temporary. Document Released: 11/24/2000 Document Revised: 08/23/2011 Document Reviewed: 09/02/2010 [employer] Patient Information 2013 [employer].  

## 2012-03-15 NOTE — Progress Notes (Signed)
Debra Huber 61 y.o.  Body mass index is 24.76 kg/(m^2).  Patient Active Problem List  Diagnosis  . Malignant neoplasm of colon, unspecified site  . Thyroid disease  . Hypertension  . Cancer of thyroid    Allergies  Allergen Reactions  . Penicillins Swelling    Past Surgical History  Procedure Date  . Colectomy     partial  . Port-a-cath removal 11/04/11  . Needle bx thyroid   . Thyroidectomy 02/25/2012    Procedure: THYROIDECTOMY;  Surgeon: Valarie Merino, MD;  Location: [facility];  Service: General;;  . Hematoma evacuation 02/26/2012    Procedure: EVACUATION HEMATOMA;  Surgeon: Valarie Merino, MD;  Location: [facility];  Service: General;  Laterality: N/A;  irrigation and drainage of hematoma and placement of drain.   DAUB, STEVE A, MD No diagnosis found.  Postop visit:  Incision OK.  Some superior edema.  Path given to her .  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   [name] is managing her Synthroid currently at 50 micrograms Return 3 months Matt B. Daphine Deutscher, MD, [facility] [phone] beeper [phone]  03/15/2012 1:57 PM

## 2012-04-03 ENCOUNTER — Ambulatory Visit: Payer: Medicare Other | Admitting: Endocrinology

## 2012-04-11 ENCOUNTER — Ambulatory Visit (INDEPENDENT_AMBULATORY_CARE_PROVIDER_SITE_OTHER): Payer: Medicare Other | Admitting: Endocrinology

## 2012-04-11 ENCOUNTER — Encounter: Payer: Self-pay | Admitting: Endocrinology

## 2012-04-11 VITALS — BP 120/80 | HR 64 | Temp 97.7°F | Wt 152.0 lb

## 2012-04-11 DIAGNOSIS — H3530 Unspecified macular degeneration: Secondary | ICD-10-CM | POA: Insufficient documentation

## 2012-04-11 DIAGNOSIS — E890 Postprocedural hypothyroidism: Secondary | ICD-10-CM | POA: Diagnosis not present

## 2012-04-11 NOTE — Patient Instructions (Addendum)
blood tests are being requested for you today.  You will be contacted with results. Please return in 1 year. 

## 2012-04-11 NOTE — Progress Notes (Signed)
Subjective:    Patient ID: Debra Huber, female    DOB: September 21, 1950, 61 y.o.   MRN: [id number]  HPI 6 weeks ago, pt underwent thyroidectomy for a nodule which was suspicious on fine-needle bx.  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   1 month ago, pt was rx'ed synthroid for an elevated TSH.   She has moderate "weakness of the joints," throughout the body," and assoc fatigue Past Medical History  Diagnosis Date  . Anemia   . Hypertension   . Thyroid disease   . Hyperthyroidism     GRAVES DISEASE  . Colon cancer dx'd 11/2009    S/P HEMI-COLECTOMY AND FINISHED CHEMO 11/24/10 -DR. ODOGWU  . Complication of anesthesia     ALWAYS FEELS VERY SLEEPY WAKING UP    Past Surgical History  Procedure Date  . Colectomy     partial  . Port-a-cath removal 11/04/11  . Needle bx thyroid   . Thyroidectomy 02/25/2012    Procedure: THYROIDECTOMY;  Surgeon: Valarie Merino, MD;  Location: [facility];  Service: General;;  . Hematoma evacuation 02/26/2012    Procedure: EVACUATION HEMATOMA;  Surgeon: Valarie Merino, MD;  Location: [facility];  Service: General;  Laterality: N/A;  irrigation and drainage of hematoma and placement of drain.    History   Social History  . Marital Status: Single    Spouse Name: N/A    Number of Children: N/A  . Years of Education: N/A   Occupational History  . Not on file.   Social History Main Topics  . Smoking status: Never Smoker   . Smokeless tobacco: Never Used  . Alcohol Use: No  . Drug Use: No  . Sexually Active: Yes   Other Topics Concern  . Not on file   Social History Narrative  . No narrative on file    Current Outpatient Prescriptions on File Prior to Visit  Medication Sig Dispense Refill  . amitriptyline (ELAVIL) 25 MG tablet Take 25 mg by mouth at bedtime.      Marland Kitchen aspirin EC 81 MG tablet Take 81 mg by mouth daily.       . cloNIDine (CATAPRES) 0.3 MG tablet Take 0.3 mg by mouth 2 (two) times daily.      .  hydrochlorothiazide (HYDRODIURIL) 25 MG tablet Take 25 mg by mouth daily with breakfast.      . levothyroxine (SYNTHROID, LEVOTHROID) 50 MCG tablet Take 1 tablet (50 mcg total) by mouth daily.  30 tablet  5  . lisinopril (PRINIVIL,ZESTRIL) 20 MG tablet Take 20 mg by mouth daily with breakfast.      . metoprolol succinate (TOPROL-XL) 25 MG 24 hr tablet Take 25 mg by mouth daily with breakfast.      . Multiple Vitamins-Minerals (CENTRUM SILVER) CHEW Chew 1 tablet by mouth daily.       . vitamin E (VITAMIN E) 1000 UNIT capsule Take 1,000 Units by mouth daily.        Allergies  Allergen Reactions  . Penicillins Swelling    No family history on file. Several cousins have uncertain types of benign thyroid dz BP 120/80  Pulse 64  Temp 97.7 F (36.5 C) (Oral)  Wt 152 lb (68.947 kg)  SpO2 98%  Review of Systems denies hair loss, cramps, sob, memory loss, constipation, numbness, myalgias, dry skin, rhinorrhea, easy bruising, and syncope.  Depression is much better over the past few years. She has lost weight, due to her efforts.  She has mild blurry vision, and easy bruising.    Objective:   Physical Exam VS: see vs page GEN: no distress HEAD: head: no deformity eyes: no periorbital swelling, but there is bilateral proptosis external nose and ears are normal.   mouth: no lesion seen. NECK: a healing scar is present.  i do not appreciate a nodule in the thyroid or elsewhere in the neck. CHEST WALL: no deformity.  LUNGS:  Clear to auscultation. CV: reg rate and rhythm, no murmur. ABD: abdomen is soft, nontender.  no hepatosplenomegaly.  not distended.  no hernia. MUSCULOSKELETAL: muscle bulk and strength are grossly normal.  no obvious joint swelling.  gait is normal and steady.   EXTEMITIES: no deformity.  no edema PULSES: dorsalis pedis intact bilat.   NEURO:  cn 2-12 grossly intact.   readily moves all 4's.  sensation is intact to touch on all 4's.   SKIN:  Normal texture and  temperature.  No rash or suspicious lesion is visible.   NODES:  None palpable at the neck.   PSYCH: alert, oriented x3.  Does not appear anxious nor depressed.   outside test results are reviewed: TSH=18 (03/08/12) Lab Results  Component Value Date   TSH 15.545* 04/11/2012      Assessment & Plan:  S/p thyroidectomy for suspicious nodule.  No cancer was found at pathol Postsurgical hypothyroidism, needs increased rx Weakness, and other sxs, uncertain if thyroid-related

## 2012-04-12 LAB — TSH: TSH: 15.545 u[IU]/mL — ABNORMAL HIGH (ref 0.350–4.500)

## 2012-04-12 MED ORDER — LEVOTHYROXINE SODIUM 100 MCG PO TABS: 100.0000 ug | ORAL_TABLET | Freq: Every day | ORAL | Status: DC

## 2012-04-13 NOTE — Progress Notes (Signed)
  Subjective:    Patient ID: Debra Huber, female    DOB: Dec 29, 1950, 61 y.o.   MRN: [id number]  HPI    Review of Systems     Objective:   Physical Exam        Assessment & Plan:  Correction: small focus of papillary cancer is seen.  She needs only f/u in 1 year.

## 2012-04-26 ENCOUNTER — Other Ambulatory Visit: Payer: Self-pay | Admitting: Nurse Practitioner

## 2012-04-26 DIAGNOSIS — C269 Malignant neoplasm of ill-defined sites within the digestive system: Secondary | ICD-10-CM

## 2012-04-26 DIAGNOSIS — C73 Malignant neoplasm of thyroid gland: Secondary | ICD-10-CM

## 2012-04-26 DIAGNOSIS — C189 Malignant neoplasm of colon, unspecified: Secondary | ICD-10-CM

## 2012-04-27 ENCOUNTER — Telehealth: Payer: Self-pay | Admitting: *Deleted

## 2012-04-27 NOTE — Telephone Encounter (Signed)
Made patient appointment for the patient on 05-12-2012 per th orders from 05-12-2012 at 10:00am  Made the lab appointment two days prior to do md appointment with lab first followed by ct scan

## 2012-05-01 ENCOUNTER — Telehealth: Payer: Self-pay | Admitting: *Deleted

## 2012-05-01 NOTE — Telephone Encounter (Signed)
Tiffany from the central scheduling needing the patient lab moved to 05-09-2012

## 2012-05-04 ENCOUNTER — Other Ambulatory Visit: Payer: Self-pay | Admitting: *Deleted

## 2012-05-04 MED ORDER — CLONIDINE HCL 0.3 MG PO TABS: 0.3000 mg | ORAL_TABLET | Freq: Two times a day (BID) | ORAL | Status: DC

## 2012-05-08 ENCOUNTER — Other Ambulatory Visit: Payer: Medicare Other | Admitting: Lab

## 2012-05-09 ENCOUNTER — Ambulatory Visit (HOSPITAL_COMMUNITY)
Admission: RE | Admit: 2012-05-09 | Discharge: 2012-05-09 | Disposition: A | Discharge status: Home / Self Care | Payer: Medicare Other | Source: Ambulatory Visit | Attending: Hematology and Oncology | Admitting: Hematology and Oncology

## 2012-05-09 ENCOUNTER — Other Ambulatory Visit (HOSPITAL_BASED_OUTPATIENT_CLINIC_OR_DEPARTMENT_OTHER): Payer: Medicare Other

## 2012-05-09 DIAGNOSIS — Z9221 Personal history of antineoplastic chemotherapy: Secondary | ICD-10-CM | POA: Insufficient documentation

## 2012-05-09 DIAGNOSIS — C189 Malignant neoplasm of colon, unspecified: Secondary | ICD-10-CM

## 2012-05-09 DIAGNOSIS — C73 Malignant neoplasm of thyroid gland: Secondary | ICD-10-CM | POA: Insufficient documentation

## 2012-05-09 DIAGNOSIS — Z85038 Personal history of other malignant neoplasm of large intestine: Secondary | ICD-10-CM | POA: Diagnosis not present

## 2012-05-09 DIAGNOSIS — R911 Solitary pulmonary nodule: Secondary | ICD-10-CM | POA: Diagnosis not present

## 2012-05-09 DIAGNOSIS — C269 Malignant neoplasm of ill-defined sites within the digestive system: Secondary | ICD-10-CM

## 2012-05-09 LAB — COMPREHENSIVE METABOLIC PANEL (CC13)
AST: 32 U/L (ref 5–34)
Albumin: 3.8 g/dL (ref 3.5–5.0)
BUN: 14 mg/dL (ref 7.0–26.0)
CO2: 29 mEq/L (ref 22–29)
Calcium: 8.7 mg/dL (ref 8.4–10.4)
Chloride: 100 mEq/L (ref 98–107)
Creatinine: 0.7 mg/dL (ref 0.6–1.1)
Glucose: 95 mg/dl (ref 70–99)
Potassium: 3.3 mEq/L — ABNORMAL LOW (ref 3.5–5.1)

## 2012-05-09 LAB — CBC WITH DIFFERENTIAL/PLATELET
Basophils Absolute: 0 10*3/uL (ref 0.0–0.1)
HCT: 41.2 % (ref 34.8–46.6)
HGB: 14 g/dL (ref 11.6–15.9)
LYMPH%: 29.9 % (ref 14.0–49.7)
MONO#: 0.4 10*3/uL (ref 0.1–0.9)
NEUT%: 59.8 % (ref 38.4–76.8)
Platelets: 267 10*3/uL (ref 145–400)
WBC: 5.6 10*3/uL (ref 3.9–10.3)
lymph#: 1.7 10*3/uL (ref 0.9–3.3)

## 2012-05-09 MED ORDER — IOHEXOL 300 MG/ML  SOLN
100.0000 mL | Freq: Once | INTRAMUSCULAR | Status: AC | PRN
  Administered 2012-05-09: 100 mL via INTRAVENOUS

## 2012-05-12 ENCOUNTER — Encounter: Payer: Self-pay | Admitting: Hematology and Oncology

## 2012-05-12 ENCOUNTER — Telehealth: Payer: Self-pay | Admitting: Hematology and Oncology

## 2012-05-12 ENCOUNTER — Ambulatory Visit (HOSPITAL_BASED_OUTPATIENT_CLINIC_OR_DEPARTMENT_OTHER): Payer: Medicare Other | Admitting: Hematology and Oncology

## 2012-05-12 VITALS — BP 138/87 | HR 70 | Temp 97.1°F | Resp 18 | Ht 66.0 in | Wt 152.0 lb

## 2012-05-12 DIAGNOSIS — C73 Malignant neoplasm of thyroid gland: Secondary | ICD-10-CM | POA: Diagnosis not present

## 2012-05-12 DIAGNOSIS — C189 Malignant neoplasm of colon, unspecified: Secondary | ICD-10-CM

## 2012-05-12 DIAGNOSIS — R911 Solitary pulmonary nodule: Secondary | ICD-10-CM

## 2012-05-12 DIAGNOSIS — C182 Malignant neoplasm of ascending colon: Secondary | ICD-10-CM

## 2012-05-12 NOTE — Patient Instructions (Addendum)
Debra Huber  [id number]   [facility] - AFTER VISIT SUMMARY   **RECOMMENDATIONS MADE BY THE CONSULTANT AND ANY TEST    RESULTS WILL BE SENT TO YOUR REFERRING DOCTORS.   YOUR EXAM FINDINGS, LABS AND RESULTS WERE DISCUSSED BY YOUR MD TODAY.  YOU CAN GO TO THE [facility] WEB SITE FOR INSTRUCTIONS ON HOW TO ASSESS MY CHART FOR ADDITIONAL INFORMATION AS NEEDED.  Your Updated drug allergies are: Allergies as of 05/12/2012 - Review Complete 05/12/2012  Allergen Reaction Noted  . Penicillins Swelling 07/12/2011    Your current list of medications are: Current Outpatient Prescriptions  Medication Sig Dispense Refill  . amitriptyline (ELAVIL) 25 MG tablet Take 25 mg by mouth at bedtime.      . cloNIDine (CATAPRES) 0.3 MG tablet Take 1 tablet (0.3 mg total) by mouth 2 (two) times daily.  180 tablet  2  . hydrochlorothiazide (HYDRODIURIL) 25 MG tablet Take 25 mg by mouth daily with breakfast.      . levothyroxine (SYNTHROID, LEVOTHROID) 100 MCG tablet Take 1 tablet (100 mcg total) by mouth daily.  30 tablet  2  . lisinopril (PRINIVIL,ZESTRIL) 20 MG tablet Take 20 mg by mouth daily with breakfast.      . metoprolol succinate (TOPROL-XL) 25 MG 24 hr tablet Take 25 mg by mouth daily with breakfast.      . Multiple Vitamins-Minerals (CENTRUM SILVER) CHEW Chew 1 tablet by mouth daily.       . vitamin E (VITAMIN E) 1000 UNIT capsule Take 1,000 Units by mouth daily.         INSTRUCTIONS GIVEN AND DISCUSSED:  See attached schedule   SPECIAL INSTRUCTIONS/FOLLOW-UP:  See above.  I acknowledge that I have been informed and understand all the instructions given to me and received a copy.I know to contact the clinic, my physician, or go to the emergency Department if any problems should occur.   I do not have any more questions at this time, but understand that I may call the [facility] at [phone] during business hours should I have any further questions or need  assistance in obtaining follow-up care.

## 2012-05-12 NOTE — Progress Notes (Signed)
This office note has been dictated.

## 2012-05-12 NOTE — Telephone Encounter (Signed)
gv and printed appt schedule for pt for March 2014.....advised pt that central scheduling will contact her with appt d/t...the patient aware

## 2012-05-12 NOTE — Progress Notes (Signed)
CC:   Stan Head. Cleta Alberts, M.D. Sean A. Everardo All, MD  IDENTIFYING STATEMENT:  The patient is a 61 year old woman with colon cancer who presents for followup.  INTERVAL HISTORY:  Ms. Sochacki reports and that she underwent a total thyroidectomy on 02/25/2012 for removal of a suspicious nodule. 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  Capsule was evident.  Margins were free of tumor. There was no evidence of lymphovascular invasion.  No lymph nodes were removed.  The tumor was pathologically staged as pT1a NX.  From the colon cancer standpoint, she has not lost any weight.  She has had no change in bowel function.  A CT scan of her chest, abdomen, pelvis on 05/09/2012 showed that the nodule that had been present for a while had slightly increased in size from 5 mm to 7 mm, located in the right upper lobe.  There was no evidence of pulmonary disease.  There was evidence of calcified small mediastinal lymph nodes.  The abdomen showed no focal hepatic lesions. The pancreas, spleen, adrenal glands, and kidneys were normal.  MEDICATIONS:  Reviewed and updated.  ALLERGIES:  Penicillin.  PAST MEDICAL HISTORY/FAMILY HISTORY/SOCIAL HISTORY:  Unchanged.  REVIEW OF SYSTEMS: 10-point review of systems negative.  PHYSICAL EXAM:  General:  Patient is a well-appearing, well-nourished woman in no distress.  Vitals:  Pulse 70, blood pressure 138/87, temperature 97.1, respirations 18, weight 252 pounds.  HEENT:  Head is atraumatic, normocephalic.  Sclerae anicteric.  Mouth moist.  Chest: Clear.  CVS:  Unremarkable.  Abdomen:  Soft, nontender.  No masses. Bowel sounds present.  Extremities:  No edema.  LAB DATA:  05/09/2012 white cell count 5.6, hemoglobin 14, hematocrit 41.2, platelets 267.  Sodium 138, potassium 3.3, chloride 100, CO2 28, BUN 14, creatinine 0.7, glucose 95.  T bili 0.43, alkaline phosphatase 72, AST 32, ALT 21.  CEA less than 0.5.   Results of CT as above.  IMPRESSION AND PLAN:  Ms. Lugar is a 61 year old woman with:  1. Right hemicolectomy on 11/17/2009 for stage III, node-positive,     moderately-differentiated adenocarcinoma of the colon.  One of 26     lymph nodes sampled was positive for tumor.  She completed 12     cycles of FOLFOX6 from 11/18/2009 through 11/24/2010.  Her last     colonoscopy in February 2012 was unremarkable.  Her CTs indicate no     evidence of metastases.  There is a small right upper lobe nodule     which has slightly increased in size.  I recommend we follow this     with another CT in 3 months' time. 2. Status post total thyroidectomy on 02/25/2012 for a pT1 NX (0.9-cm     papillary carcinoma of the follicular variant.)  The patient is     being followed by Dr. Romero Belling.  She is presently on thyroid     replacement therapy.    ______________________________ Laurice Record, M.D. LIO/MEDQ  D:  05/12/2012  T:  05/12/2012  Job:  [id number]

## 2012-06-16 ENCOUNTER — Encounter (INDEPENDENT_AMBULATORY_CARE_PROVIDER_SITE_OTHER): Payer: Medicare Other | Admitting: Surgery

## 2012-06-30 ENCOUNTER — Other Ambulatory Visit: Payer: Self-pay | Admitting: Endocrinology

## 2012-07-15 ENCOUNTER — Encounter: Payer: Self-pay | Admitting: Oncology

## 2012-07-15 ENCOUNTER — Telehealth: Payer: Self-pay | Admitting: Oncology

## 2012-07-15 NOTE — Telephone Encounter (Signed)
LMONVM OF THE PT ADVISING HER OF THE RE-ASSIGNING FROM DR LO TO DR [facility] AND THE F/U LETTER AND APPT CALENDAR

## 2012-07-16 ENCOUNTER — Other Ambulatory Visit: Payer: Self-pay | Admitting: Physician Assistant

## 2012-08-31 ENCOUNTER — Ambulatory Visit (INDEPENDENT_AMBULATORY_CARE_PROVIDER_SITE_OTHER): Payer: Medicare Other | Admitting: Surgery

## 2012-08-31 ENCOUNTER — Encounter (INDEPENDENT_AMBULATORY_CARE_PROVIDER_SITE_OTHER): Payer: Self-pay | Admitting: Surgery

## 2012-08-31 VITALS — BP 112/68 | HR 76 | Temp 97.2°F | Resp 16 | Ht 67.25 in | Wt 157.0 lb

## 2012-08-31 DIAGNOSIS — C73 Malignant neoplasm of thyroid gland: Secondary | ICD-10-CM

## 2012-08-31 NOTE — Progress Notes (Signed)
Debra Huber 62 y.o.  Body mass index is 24.41 kg/(m^2).  Patient Active Problem List  Diagnosis  . Malignant neoplasm of colon, unspecified site  . Hypertension  . Cancer of thyroid  . Macular degeneration  . Postsurgical hypothyroidism    Allergies  Allergen Reactions  . Penicillins Swelling    Past Surgical History  Procedure Laterality Date  . Colectomy      partial  . Port-a-cath removal  11/04/11  . Needle bx thyroid    . Thyroidectomy  02/25/2012    Procedure: THYROIDECTOMY;  Surgeon: Valarie Merino, MD;  Location: [facility];  Service: General;;  . Hematoma evacuation  02/26/2012    Procedure: EVACUATION HEMATOMA;  Surgeon: Valarie Merino, MD;  Location: [facility];  Service: General;  Laterality: N/A;  irrigation and drainage of hematoma and placement of drain.   DAUB, STEVE A, MD No diagnosis found.  Incision has small keloid along it.  Advised to massage.  Energy level appropriate.  Reviewed and gave copy of lab.  She will be followed for her colon cancer by Dr. Truett Perna. Return prn Matt B. Daphine Deutscher, MD, [facility] [phone] beeper [phone]  08/31/2012 10:03 AM

## 2012-09-04 ENCOUNTER — Ambulatory Visit (HOSPITAL_COMMUNITY)
Admission: RE | Admit: 2012-09-04 | Discharge: 2012-09-04 | Disposition: A | Discharge status: Home / Self Care | Payer: Medicare Other | Source: Ambulatory Visit | Attending: Hematology and Oncology | Admitting: Hematology and Oncology

## 2012-09-04 ENCOUNTER — Other Ambulatory Visit (HOSPITAL_BASED_OUTPATIENT_CLINIC_OR_DEPARTMENT_OTHER): Payer: Medicare Other | Admitting: Lab

## 2012-09-04 ENCOUNTER — Encounter (HOSPITAL_COMMUNITY): Payer: Self-pay

## 2012-09-04 DIAGNOSIS — I700 Atherosclerosis of aorta: Secondary | ICD-10-CM | POA: Insufficient documentation

## 2012-09-04 DIAGNOSIS — R918 Other nonspecific abnormal finding of lung field: Secondary | ICD-10-CM | POA: Diagnosis not present

## 2012-09-04 DIAGNOSIS — C73 Malignant neoplasm of thyroid gland: Secondary | ICD-10-CM

## 2012-09-04 DIAGNOSIS — C189 Malignant neoplasm of colon, unspecified: Secondary | ICD-10-CM | POA: Diagnosis not present

## 2012-09-04 DIAGNOSIS — Z85038 Personal history of other malignant neoplasm of large intestine: Secondary | ICD-10-CM | POA: Insufficient documentation

## 2012-09-04 DIAGNOSIS — C182 Malignant neoplasm of ascending colon: Secondary | ICD-10-CM

## 2012-09-04 DIAGNOSIS — Z85850 Personal history of malignant neoplasm of thyroid: Secondary | ICD-10-CM | POA: Insufficient documentation

## 2012-09-04 DIAGNOSIS — Z800 Family history of malignant neoplasm of digestive organs: Secondary | ICD-10-CM | POA: Diagnosis not present

## 2012-09-04 DIAGNOSIS — Z9221 Personal history of antineoplastic chemotherapy: Secondary | ICD-10-CM | POA: Diagnosis not present

## 2012-09-04 DIAGNOSIS — R911 Solitary pulmonary nodule: Secondary | ICD-10-CM | POA: Diagnosis not present

## 2012-09-04 LAB — COMPREHENSIVE METABOLIC PANEL (CC13)
AST: 24 U/L (ref 5–34)
Albumin: 3.4 g/dL — ABNORMAL LOW (ref 3.5–5.0)
Alkaline Phosphatase: 62 U/L (ref 40–150)
Calcium: 8.5 mg/dL (ref 8.4–10.4)
Chloride: 100 mEq/L (ref 98–107)
Glucose: 96 mg/dl (ref 70–99)
Potassium: 3.5 mEq/L (ref 3.5–5.1)
Sodium: 136 mEq/L (ref 136–145)
Total Protein: 6.8 g/dL (ref 6.4–8.3)

## 2012-09-04 LAB — CBC WITH DIFFERENTIAL/PLATELET
Basophils Absolute: 0 10*3/uL (ref 0.0–0.1)
EOS%: 2.2 % (ref 0.0–7.0)
HCT: 39.2 % (ref 34.8–46.6)
HGB: 13.3 g/dL (ref 11.6–15.9)
MCH: 28.7 pg (ref 25.1–34.0)
MONO#: 0.5 10*3/uL (ref 0.1–0.9)
NEUT#: 2.8 10*3/uL (ref 1.5–6.5)
RDW: 14.3 % (ref 11.2–14.5)
WBC: 5.2 10*3/uL (ref 3.9–10.3)
lymph#: 1.8 10*3/uL (ref 0.9–3.3)

## 2012-09-04 MED ORDER — IOHEXOL 300 MG/ML  SOLN
80.0000 mL | Freq: Once | INTRAMUSCULAR | Status: AC | PRN
  Administered 2012-09-04: 80 mL via INTRAVENOUS

## 2012-09-06 ENCOUNTER — Ambulatory Visit: Payer: Medicare Other | Admitting: Hematology and Oncology

## 2012-09-07 ENCOUNTER — Ambulatory Visit: Payer: Medicare Other | Admitting: Oncology

## 2012-09-07 ENCOUNTER — Telehealth: Payer: Self-pay | Admitting: Oncology

## 2012-09-07 NOTE — Telephone Encounter (Signed)
Pt called a r/s appt from today to 10/12/12 a former pt of Dr. Dalene Carrow, nurse notified

## 2012-09-29 ENCOUNTER — Other Ambulatory Visit: Payer: Self-pay | Admitting: Endocrinology

## 2012-10-10 ENCOUNTER — Ambulatory Visit: Payer: Medicare Other | Admitting: Endocrinology

## 2012-10-10 ENCOUNTER — Encounter: Payer: Self-pay | Admitting: Emergency Medicine

## 2012-10-10 ENCOUNTER — Ambulatory Visit (INDEPENDENT_AMBULATORY_CARE_PROVIDER_SITE_OTHER): Payer: Medicare Other | Admitting: Emergency Medicine

## 2012-10-10 VITALS — BP 167/103 | HR 55 | Temp 96.8°F | Resp 17 | Ht 66.0 in | Wt 156.4 lb

## 2012-10-10 DIAGNOSIS — R82998 Other abnormal findings in urine: Secondary | ICD-10-CM

## 2012-10-10 DIAGNOSIS — F411 Generalized anxiety disorder: Secondary | ICD-10-CM

## 2012-10-10 DIAGNOSIS — I10 Essential (primary) hypertension: Secondary | ICD-10-CM

## 2012-10-10 DIAGNOSIS — C189 Malignant neoplasm of colon, unspecified: Secondary | ICD-10-CM | POA: Diagnosis not present

## 2012-10-10 DIAGNOSIS — Z1239 Encounter for other screening for malignant neoplasm of breast: Secondary | ICD-10-CM

## 2012-10-10 DIAGNOSIS — Z1231 Encounter for screening mammogram for malignant neoplasm of breast: Secondary | ICD-10-CM

## 2012-10-10 DIAGNOSIS — E785 Hyperlipidemia, unspecified: Secondary | ICD-10-CM

## 2012-10-10 DIAGNOSIS — C73 Malignant neoplasm of thyroid gland: Secondary | ICD-10-CM

## 2012-10-10 DIAGNOSIS — Z0000 Encounter for general adult medical examination without abnormal findings: Secondary | ICD-10-CM

## 2012-10-10 DIAGNOSIS — R8281 Pyuria: Secondary | ICD-10-CM

## 2012-10-10 LAB — POCT URINALYSIS DIPSTICK
Blood, UA: NEGATIVE
Protein, UA: NEGATIVE
Spec Grav, UA: 1.02
Urobilinogen, UA: 0.2

## 2012-10-10 LAB — IFOBT (OCCULT BLOOD): IFOBT: NEGATIVE

## 2012-10-10 LAB — POCT UA - MICROSCOPIC ONLY
Crystals, Ur, HPF, POC: NEGATIVE
Yeast, UA: NEGATIVE

## 2012-10-10 MED ORDER — METOPROLOL SUCCINATE ER 25 MG PO TB24: 25.0000 mg | ORAL_TABLET | Freq: Every day | ORAL | Status: DC

## 2012-10-10 MED ORDER — LISINOPRIL 20 MG PO TABS: 20.0000 mg | ORAL_TABLET | Freq: Every day | ORAL | Status: DC

## 2012-10-10 MED ORDER — LEVOTHYROXINE SODIUM 100 MCG PO TABS: ORAL_TABLET | ORAL | Status: DC

## 2012-10-10 MED ORDER — CLONIDINE HCL 0.3 MG PO TABS: 0.3000 mg | ORAL_TABLET | Freq: Two times a day (BID) | ORAL | Status: DC

## 2012-10-10 MED ORDER — AMITRIPTYLINE HCL 25 MG PO TABS: 25.0000 mg | ORAL_TABLET | Freq: Every day | ORAL | Status: DC

## 2012-10-10 MED ORDER — HYDROCHLOROTHIAZIDE 25 MG PO TABS: 25.0000 mg | ORAL_TABLET | Freq: Every day | ORAL | Status: DC

## 2012-10-10 NOTE — Progress Notes (Signed)
Subjective:    Patient ID: Debra Huber, female    DOB: Oct 11, 1950, 62 y.o.   MRN: [id number]  HPI 62 yo here for physical. Is not fasting  States her BP is elevated because she is still worried about her thyroid. States it is generally good, around 120's/70's. Under a lot of stress. 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 States it will be good when she returns. Did take all 3 of bp meds today. States she works out regularly, almost every day. Does elliptical machine and swims.  States she has a healthy diet.  States she has been losing her balance since the thyroid surgery. Asked surgeon about that and he did not comment. States she has to "think about movement". Only happens after a hard workout. States she feels slow after she sits down. Denies vertigo or pre-syncope.  Also, she is not happy with her surgical scar. States she is mentally stable. Not depressed.  Going over chest CT showed opacity in R upper lung. Thought to be scarring. Denies any smoking history. Never been around any chemicals. Did have secondhand smoke exposure. No family history of lung cancers.   Has numbness in fingers and her toes for her chemotherapy. Is not currently taking anything for it. States she will not take anything for it.  Has not had pneumonia or shingles vaccines. She currently declines both. Last mammogram was 2-3 years ago.   Review of Systems  Constitutional: Negative for fever, chills, fatigue and unexpected weight change.  Respiratory: Negative for cough, chest tightness, shortness of breath and wheezing.   Cardiovascular: Negative.   Gastrointestinal: Negative for nausea, vomiting, diarrhea and constipation.  Skin: Negative for rash.       Surgical scar across neck  Neurological: Positive for numbness (in fingers and toes).  Psychiatric/Behavioral: Negative.        Objective:   Physical Exam  Constitutional: She is oriented to person, place, and time.  She appears well-developed and well-nourished.  HENT:  Head: Normocephalic and atraumatic.  Right Ear: External ear normal.  Left Ear: External ear normal.  Nose: Nose normal.  Mouth/Throat: Oropharynx is clear and moist.  Eyes: Conjunctivae and EOM are normal. Pupils are equal, round, and reactive to light.  Neck: Normal range of motion. Neck supple.  Surgical scar across neck. Small keloid formation in middle.  Cardiovascular: Normal rate, regular rhythm, normal heart sounds and intact distal pulses.   Pulmonary/Chest: Effort normal and breath sounds normal.  Abdominal: Soft. Bowel sounds are normal.  Neurological: She is alert and oriented to person, place, and time.  Skin: Skin is warm and dry.   Recheck BP: 162/105 Recheck BP 15 minutes later: 200/116 Pelvic exam. Cervix appears normal there is no uterine enlargement there are no adnexal masses rectal confirms above without masses stool sent for blood Results for orders placed in visit on 10/10/12  POCT URINALYSIS DIPSTICK      Result Value Range   Color, UA yellow     Clarity, UA clear     Glucose, UA neg     Bilirubin, UA neg     Ketones, UA neg     Spec Grav, UA 1.020     Blood, UA neg     pH, UA 7.0     Protein, UA neg     Urobilinogen, UA 0.2     Nitrite, UA neg     Leukocytes, UA large (3+)    POCT  UA - MICROSCOPIC ONLY      Result Value Range   WBC, Ur, HPF, POC 0-7     RBC, urine, microscopic 0-2     Bacteria, U Microscopic trace     Mucus, UA trace     Epithelial cells, urine per micros 0-6     Crystals, Ur, HPF, POC neg     Casts, Ur, LPF, POC neg     Yeast, UA neg    IFOBT (OCCULT BLOOD)      Result Value Range   IFOBT Negative        Assessment & Plan:  Routine labs will be done tomorrow. She was not fasting today. She did have pyuria so urine culture was done. Pap smear was sent. She is scheduled for screening mammogram. She did not desire a shingles vaccine or any type of vaccination updates. The  patient was told she needs to have a CT of her chest done in 6 months which will be in September to reevaluate the abnormal area of her right upper lobe.

## 2012-10-10 NOTE — Progress Notes (Deleted)
  Subjective:    Patient ID: Debra Huber, female    DOB: 1951-02-17, 62 y.o.   MRN: [id number]  HPI    Review of Systems     Objective:   Physical Exam        Assessment & Plan:

## 2012-10-11 LAB — PAP IG (IMAGE GUIDED)

## 2012-10-12 ENCOUNTER — Encounter: Payer: Medicare Other | Admitting: Oncology

## 2012-10-12 NOTE — Progress Notes (Signed)
Patient Debra Huber today. Will not reschedule per Dr. Truett Perna until patient calls to make appointment herself.

## 2012-10-16 ENCOUNTER — Ambulatory Visit: Payer: Medicare Other | Admitting: Endocrinology

## 2012-10-18 ENCOUNTER — Other Ambulatory Visit (INDEPENDENT_AMBULATORY_CARE_PROVIDER_SITE_OTHER): Payer: Medicare Other | Admitting: *Deleted

## 2012-10-18 DIAGNOSIS — E785 Hyperlipidemia, unspecified: Secondary | ICD-10-CM

## 2012-10-18 DIAGNOSIS — C189 Malignant neoplasm of colon, unspecified: Secondary | ICD-10-CM | POA: Diagnosis not present

## 2012-10-18 DIAGNOSIS — I10 Essential (primary) hypertension: Secondary | ICD-10-CM

## 2012-10-18 DIAGNOSIS — C73 Malignant neoplasm of thyroid gland: Secondary | ICD-10-CM

## 2012-10-18 NOTE — Progress Notes (Signed)
Patient here for labs only. 

## 2012-10-19 ENCOUNTER — Ambulatory Visit: Payer: Medicare Other

## 2012-10-19 LAB — CBC WITH DIFFERENTIAL/PLATELET
Basophils Relative: 1 % (ref 0–1)
Hemoglobin: 13.2 g/dL (ref 12.0–15.0)
Lymphs Abs: 1.3 10*3/uL (ref 0.7–4.0)
MCHC: 33.6 g/dL (ref 30.0–36.0)
Monocytes Relative: 6 % (ref 3–12)
Neutro Abs: 2.4 10*3/uL (ref 1.7–7.7)
Neutrophils Relative %: 59 % (ref 43–77)
RBC: 4.73 MIL/uL (ref 3.87–5.11)

## 2012-10-19 LAB — COMPREHENSIVE METABOLIC PANEL
ALT: 11 U/L (ref 0–35)
Albumin: 4.3 g/dL (ref 3.5–5.2)
Alkaline Phosphatase: 58 U/L (ref 39–117)
Glucose, Bld: 86 mg/dL (ref 70–99)
Potassium: 4.2 mEq/L (ref 3.5–5.3)
Sodium: 139 mEq/L (ref 135–145)
Total Protein: 6.7 g/dL (ref 6.0–8.3)

## 2012-10-19 LAB — LIPID PANEL
Cholesterol: 196 mg/dL (ref 0–200)
LDL Cholesterol: 131 mg/dL — ABNORMAL HIGH (ref 0–99)
VLDL: 13 mg/dL (ref 0–40)

## 2012-10-20 ENCOUNTER — Ambulatory Visit: Payer: Medicare Other

## 2012-10-24 ENCOUNTER — Other Ambulatory Visit: Payer: Self-pay | Admitting: *Deleted

## 2012-10-24 DIAGNOSIS — C73 Malignant neoplasm of thyroid gland: Secondary | ICD-10-CM

## 2012-10-24 MED ORDER — LEVOTHYROXINE SODIUM 112 MCG PO TABS: 112.0000 ug | ORAL_TABLET | Freq: Every day | ORAL | Status: DC

## 2012-10-27 ENCOUNTER — Telehealth: Payer: Self-pay

## 2012-10-27 NOTE — Telephone Encounter (Signed)
No, she d/c the 100 mcg and takes the 112 mcg, she is advised

## 2012-10-27 NOTE — Telephone Encounter (Signed)
Pt was prescribed new thyroid medicine by Dr. Cleta Alberts.  She wants to know is she still to continue taking the old thyroid medication also.  [phone]

## 2012-10-31 ENCOUNTER — Ambulatory Visit: Payer: Medicare Other | Admitting: Endocrinology

## 2012-12-01 NOTE — Telephone Encounter (Signed)
e

## 2012-12-10 NOTE — Progress Notes (Signed)
This encounter was created in error - please disregard.

## 2012-12-28 ENCOUNTER — Ambulatory Visit (INDEPENDENT_AMBULATORY_CARE_PROVIDER_SITE_OTHER): Payer: Medicare Other | Admitting: Emergency Medicine

## 2012-12-28 VITALS — BP 142/86 | HR 78 | Temp 97.6°F | Resp 18 | Ht 67.0 in | Wt 152.6 lb

## 2012-12-28 DIAGNOSIS — N63 Unspecified lump in unspecified breast: Secondary | ICD-10-CM

## 2012-12-28 NOTE — Progress Notes (Signed)
  Subjective:    Patient ID: Debra Huber, female    DOB: 07-27-1950, 62 y.o.   MRN: [id number]  HPI  Pt presents today with a mass that she felt on her left breast yesterday. Pt has not had a mammogram recently.  Pt has a bruise over the area but doesn't remember injuring herself.    Review of Systems     Objective:   Physical Exam at 2:00 on the left breast approximately 3 inches from the nipple there is a 2 x 3 cm area of ecchymoses which a 1 x 1 cm area of hematoma formation        Assessment & Plan:  In cases this is a hematoma which is resolving I would like to wait and get her mammogram in 3 weeks. That would give time for an injury to resolve prior to doing a mammogram. Her mammogram will be scheduled for 3 weeks from now .

## 2012-12-29 ENCOUNTER — Other Ambulatory Visit: Payer: Self-pay | Admitting: Emergency Medicine

## 2012-12-29 DIAGNOSIS — N63 Unspecified lump in unspecified breast: Secondary | ICD-10-CM

## 2013-01-15 ENCOUNTER — Ambulatory Visit
Admission: RE | Admit: 2013-01-15 | Discharge: 2013-01-15 | Disposition: A | Discharge status: Home / Self Care | Payer: Medicare Other | Source: Ambulatory Visit | Attending: Emergency Medicine | Admitting: Emergency Medicine

## 2013-01-15 ENCOUNTER — Other Ambulatory Visit: Payer: Self-pay | Admitting: Emergency Medicine

## 2013-01-15 DIAGNOSIS — N63 Unspecified lump in unspecified breast: Secondary | ICD-10-CM

## 2013-01-15 DIAGNOSIS — N6459 Other signs and symptoms in breast: Secondary | ICD-10-CM | POA: Diagnosis not present

## 2013-01-15 DIAGNOSIS — R921 Mammographic calcification found on diagnostic imaging of breast: Secondary | ICD-10-CM

## 2013-01-15 DIAGNOSIS — S2000XA Contusion of breast, unspecified breast, initial encounter: Secondary | ICD-10-CM | POA: Diagnosis not present

## 2013-01-25 ENCOUNTER — Other Ambulatory Visit: Payer: Self-pay | Admitting: Radiology

## 2013-01-25 ENCOUNTER — Ambulatory Visit
Admission: RE | Admit: 2013-01-25 | Discharge: 2013-01-25 | Disposition: A | Discharge status: Home / Self Care | Payer: Medicare Other | Source: Ambulatory Visit | Attending: Emergency Medicine | Admitting: Emergency Medicine

## 2013-01-25 DIAGNOSIS — R921 Mammographic calcification found on diagnostic imaging of breast: Secondary | ICD-10-CM

## 2013-01-25 DIAGNOSIS — R928 Other abnormal and inconclusive findings on diagnostic imaging of breast: Secondary | ICD-10-CM | POA: Diagnosis not present

## 2013-01-25 DIAGNOSIS — N6489 Other specified disorders of breast: Secondary | ICD-10-CM | POA: Diagnosis not present

## 2013-02-20 ENCOUNTER — Ambulatory Visit: Payer: Medicare Other | Admitting: Emergency Medicine

## 2013-03-17 ENCOUNTER — Other Ambulatory Visit: Payer: Self-pay | Admitting: Emergency Medicine

## 2013-03-19 NOTE — Telephone Encounter (Signed)
Dr Cleta Alberts, are you Rxing this for pt? I see that she is on levothyroxine, but I don't see this med.

## 2013-03-22 NOTE — Telephone Encounter (Signed)
I spoke w/Dr Cleta Alberts regarding pt being on both the levothyroxine and Tapazole. Dr Cleta Alberts called pt to verify that she did have all of her thyroid removed when surgery was performed and that she is currently taking 112 mcg of levothyroxine QD. Pt verified both and Dr Cleta Alberts instr'd pt to stop taking the Tapazole which counters the effect of the levothroxine. Pt voice understanding and stated she has an appt scheduled to see Dr Cleta Alberts soon to follow up on this change.

## 2013-03-27 ENCOUNTER — Ambulatory Visit: Payer: Medicare Other | Admitting: Emergency Medicine

## 2013-04-14 ENCOUNTER — Other Ambulatory Visit: Payer: Self-pay | Admitting: Emergency Medicine

## 2013-05-01 ENCOUNTER — Encounter: Payer: Self-pay | Admitting: Emergency Medicine

## 2013-05-01 ENCOUNTER — Ambulatory Visit (INDEPENDENT_AMBULATORY_CARE_PROVIDER_SITE_OTHER): Payer: Medicare Other | Admitting: Emergency Medicine

## 2013-05-01 VITALS — BP 192/122 | HR 54 | Temp 98.0°F | Resp 16 | Ht 67.0 in | Wt 155.8 lb

## 2013-05-01 DIAGNOSIS — R9389 Abnormal findings on diagnostic imaging of other specified body structures: Secondary | ICD-10-CM

## 2013-05-01 DIAGNOSIS — I10 Essential (primary) hypertension: Secondary | ICD-10-CM

## 2013-05-01 DIAGNOSIS — C73 Malignant neoplasm of thyroid gland: Secondary | ICD-10-CM

## 2013-05-01 DIAGNOSIS — C189 Malignant neoplasm of colon, unspecified: Secondary | ICD-10-CM

## 2013-05-01 LAB — CBC WITH DIFFERENTIAL/PLATELET
Eosinophils Absolute: 0 10*3/uL (ref 0.0–0.7)
Eosinophils Relative: 1 % (ref 0–5)
HCT: 40.6 % (ref 36.0–46.0)
Lymphocytes Relative: 31 % (ref 12–46)
Lymphs Abs: 1.3 10*3/uL (ref 0.7–4.0)
MCH: 28.1 pg (ref 26.0–34.0)
MCV: 82.2 fL (ref 78.0–100.0)
Monocytes Absolute: 0.3 10*3/uL (ref 0.1–1.0)
RBC: 4.94 MIL/uL (ref 3.87–5.11)
WBC: 4.4 10*3/uL (ref 4.0–10.5)

## 2013-05-01 LAB — COMPREHENSIVE METABOLIC PANEL
ALT: 16 U/L (ref 0–35)
BUN: 10 mg/dL (ref 6–23)
CO2: 32 mEq/L (ref 19–32)
Calcium: 9.3 mg/dL (ref 8.4–10.5)
Chloride: 100 mEq/L (ref 96–112)
Creat: 0.69 mg/dL (ref 0.50–1.10)
Glucose, Bld: 90 mg/dL (ref 70–99)
Total Bilirubin: 0.4 mg/dL (ref 0.3–1.2)

## 2013-05-01 MED ORDER — CLONIDINE HCL 0.3 MG PO TABS: 0.3000 mg | ORAL_TABLET | Freq: Two times a day (BID) | ORAL | Status: DC

## 2013-05-01 MED ORDER — HYDROCHLOROTHIAZIDE 25 MG PO TABS: 25.0000 mg | ORAL_TABLET | Freq: Every day | ORAL | Status: DC

## 2013-05-01 MED ORDER — METOPROLOL SUCCINATE ER 25 MG PO TB24: 25.0000 mg | ORAL_TABLET | Freq: Every day | ORAL | Status: DC

## 2013-05-01 MED ORDER — LISINOPRIL 20 MG PO TABS: 20.0000 mg | ORAL_TABLET | Freq: Every day | ORAL | Status: DC

## 2013-05-01 NOTE — Patient Instructions (Signed)
Please make an appointment to be seen in 2 months .

## 2013-05-01 NOTE — Progress Notes (Signed)
  Subjective:    Patient ID: Debra Huber, female    DOB: 06/08/1951, 62 y.o.   MRN: [id number]  HPI patient here for followup colon cancer and thyroid cancer. She's had surgery for both. She required chemotherapy for her colon cancer. She has not been back to the cancer Center since Dr. Dalene Carrow left . She had biopsies done of the right breast which turned out to be a hamartoma. She was scheduled to have a CT of the chest to followup an area of lung scarring and small nodules but did not get this done yet    Review of Systems     Objective:   Physical Exam patient looks good she is alert and cooperative. Neck is supple there's a healed thyroidectomy scar chest was clear. Heart regular rate and rhythm. There is a healed biopsy site upper outer quadrant of the right breast. Abdominal exam reveals a large midline scar without masses.        Assessment & Plan:  I made a referral back for to see the oncologist. Referral made per patient have a CT of the chest .  Blood pressure was up significantly today the patient states she did not take her medication today . She is to have a followup ultrasound the first of the year. Her blood pressure today was 180/120. She states she did not take any of her medication today. I told her she has to take her medication regularly

## 2013-05-02 ENCOUNTER — Telehealth: Payer: Self-pay | Admitting: *Deleted

## 2013-05-02 LAB — THYROGLOBULIN LEVEL: Thyroglobulin: <0.2 ng/mL (ref 0.0–55.0)

## 2013-05-02 LAB — TSH: TSH: 10.617 u[IU]/mL — ABNORMAL HIGH (ref 0.350–4.500)

## 2013-05-02 LAB — T4, FREE: Free T4: 1.54 ng/dL (ref 0.80–1.80)

## 2013-05-02 NOTE — Telephone Encounter (Signed)
Attempted to reach patient without success.  Will try again 05/03/13.

## 2013-05-03 ENCOUNTER — Telehealth: Payer: Self-pay | Admitting: *Deleted

## 2013-05-03 NOTE — Telephone Encounter (Signed)
Spoke with patient by phone and confirmed appointment with Dr,. Sherrill for 05/22/13. Contact names and numbers were provided.

## 2013-05-04 ENCOUNTER — Telehealth: Payer: Self-pay

## 2013-05-04 ENCOUNTER — Ambulatory Visit
Admission: RE | Admit: 2013-05-04 | Discharge: 2013-05-04 | Disposition: A | Discharge status: Home / Self Care | Payer: Medicare Other | Source: Ambulatory Visit | Attending: Emergency Medicine | Admitting: Emergency Medicine

## 2013-05-04 DIAGNOSIS — R9389 Abnormal findings on diagnostic imaging of other specified body structures: Secondary | ICD-10-CM

## 2013-05-04 MED ORDER — LEVOTHYROXINE SODIUM 125 MCG PO TABS: 125.0000 ug | ORAL_TABLET | Freq: Every day | ORAL | Status: DC

## 2013-05-04 MED ORDER — IOHEXOL 300 MG/ML  SOLN
75.0000 mL | Freq: Once | INTRAMUSCULAR | Status: AC | PRN
  Administered 2013-05-04: 75 mL via INTRAVENOUS

## 2013-05-04 NOTE — Telephone Encounter (Signed)
Synthroid 125 mcg sent to pharm

## 2013-05-11 ENCOUNTER — Other Ambulatory Visit: Payer: Self-pay | Admitting: Radiology

## 2013-05-11 DIAGNOSIS — R9389 Abnormal findings on diagnostic imaging of other specified body structures: Secondary | ICD-10-CM

## 2013-05-17 ENCOUNTER — Other Ambulatory Visit: Payer: Self-pay | Admitting: Gastroenterology

## 2013-05-22 ENCOUNTER — Ambulatory Visit: Payer: Medicare Other

## 2013-05-22 ENCOUNTER — Telehealth: Payer: Self-pay | Admitting: Oncology

## 2013-05-22 ENCOUNTER — Ambulatory Visit (HOSPITAL_BASED_OUTPATIENT_CLINIC_OR_DEPARTMENT_OTHER): Payer: Medicare Other | Admitting: Oncology

## 2013-05-22 ENCOUNTER — Encounter: Payer: Self-pay | Admitting: Oncology

## 2013-05-22 ENCOUNTER — Ambulatory Visit: Payer: Medicare Other | Admitting: Lab

## 2013-05-22 VITALS — BP 125/87 | HR 56 | Temp 96.6°F | Resp 20 | Ht 67.0 in | Wt 160.9 lb

## 2013-05-22 DIAGNOSIS — C182 Malignant neoplasm of ascending colon: Secondary | ICD-10-CM | POA: Diagnosis not present

## 2013-05-22 DIAGNOSIS — J90 Pleural effusion, not elsewhere classified: Secondary | ICD-10-CM | POA: Diagnosis not present

## 2013-05-22 DIAGNOSIS — C73 Malignant neoplasm of thyroid gland: Secondary | ICD-10-CM

## 2013-05-22 DIAGNOSIS — C189 Malignant neoplasm of colon, unspecified: Secondary | ICD-10-CM

## 2013-05-22 DIAGNOSIS — I10 Essential (primary) hypertension: Secondary | ICD-10-CM | POA: Diagnosis not present

## 2013-05-22 NOTE — Telephone Encounter (Signed)
gv and printed appt sched an davs for pt for June 2015....sent pt to lab

## 2013-05-22 NOTE — Progress Notes (Signed)
Checked in new patient with no financial issues. She has appt card. °

## 2013-05-22 NOTE — Progress Notes (Signed)
Met with Debra Huber.   Explained role of nurse navigator.  She is here for follow-up of her cancer care and to establish with Dr. Truett Perna.   [facility] resources provided to patient, including SW service information.  Contact names and phone numbers were provided for entire [facility] team.  No barriers to care identified.

## 2013-05-22 NOTE — Progress Notes (Signed)
   [facility]    OFFICE PROGRESS NOTE   INTERVAL HISTORY:   She has been followed by Dr. Dalene Carrow since being diagnosed with colon cancer in 2011. She completed adjuvant FOLFOX chemotherapy. She will subsequent diagnosed with differentiated thyroid cancer and underwent a thyroidectomy in September 2013. She is followed by Dr. Everardo All for the thyroid cancer.  She reports undergoing a colonoscopy by Dr. Evette Cristal last week.  Ms. Shadle feels well. She underwent a restaging CT of the chest on 05/04/2013 to followup on a pulmonary nodule. An irregular tubular density in the right upper lobe was felt to represent a small impacted bronchus. This was unchanged. No new or enlarging Mary nodules were noted. Calcified mediastinal lymph nodes were compatible with prior granulomatous disease. New small pleural effusions were noted bilaterally. This was compared to a CT from 09/04/2012. She has been referred to pulmonary medicine for evaluation of the pleural effusions.  Review of systems: Positive for colon: Occasional nausea when eating fast, decreased visual acuity following thyroid surgery-now improved. A complete review of systems was otherwise negative  Objective:  Vital signs in last 24 hours:  Blood pressure 125/87, pulse 56, temperature 96.6 F (35.9 C), temperature source Oral, resp. rate 20, height 5\' 7"  (1.702 m), weight 160 lb 14.4 oz (72.984 kg).    HEENT: Neck without mass Lymphatics: Pea-sized bilateral scalene nodes. No other cervical, supraclavicular, axillary, or inguinal nodes Resp: Lungs clear bilaterally Cardio: Regular rate and rhythm GI: No hepatosplenomegaly, nontender, no mass Vascular: No leg edema   Lab Results:  CEA pending   Medications: I have reviewed the patient's current medications.  Assessment/Plan:  1. Stage III (T3 N1), grade 2, adenocarcinoma of the ascending colon, status post a right hemicolectomy 11/17/2009, status post adjuvant  FOLFOX for 12 cycles completed January 2012  2. 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 Everardo All  3. Benign right breast biopsy 01/25/2013-hamartomatous lesion with calcifications  4. Small bilateral pleural effusions on a chest CT 05/04/2013-she has been referred to pulmonary medicine  5. Hypertension  Disposition:  She remains in clinical remission from colon cancer. We checked a CEA today. She will return for an office visit and CEA in 6 months. We will followup on the recent colonoscopy by Dr. Evette Cristal.   Thornton Papas, MD  05/22/2013  5:15 PM

## 2013-05-23 LAB — CEA: CEA: <0.5 ng/mL (ref 0.0–5.0)

## 2013-05-24 ENCOUNTER — Telehealth: Payer: Self-pay | Admitting: *Deleted

## 2013-05-24 ENCOUNTER — Institutional Professional Consult (permissible substitution): Payer: Medicare Other | Admitting: Internal Medicine

## 2013-05-24 NOTE — Telephone Encounter (Signed)
Left message for patient to call back regarding lab results.  Per Dr. Sherrill. 

## 2013-05-24 NOTE — Telephone Encounter (Signed)
Message copied by Raphael Gibney on Thu May 24, 2013  9:52 AM ------      Message from: [place], [place] P      Created: Wed May 23, 2013  5:26 PM                   ----- Message -----         From: Ladene Artist, MD         Sent: 05/23/2013   8:58 AM           To: Wandalee Ferdinand, RN, Glori Luis, RN, #            Please call patient, cea is normal ------

## 2013-05-24 NOTE — Telephone Encounter (Signed)
Called and informed patient of normal CEA.  Per Dr. Truett Perna.  Patient verbalized understanding.

## 2013-06-05 ENCOUNTER — Institutional Professional Consult (permissible substitution): Payer: Medicare Other | Admitting: Internal Medicine

## 2013-06-26 ENCOUNTER — Encounter: Payer: Self-pay | Admitting: Emergency Medicine

## 2013-06-26 ENCOUNTER — Ambulatory Visit (INDEPENDENT_AMBULATORY_CARE_PROVIDER_SITE_OTHER): Payer: Medicare Other | Admitting: Emergency Medicine

## 2013-06-26 VITALS — BP 130/93 | HR 66 | Temp 97.3°F | Resp 16 | Wt 164.0 lb

## 2013-06-26 DIAGNOSIS — C73 Malignant neoplasm of thyroid gland: Secondary | ICD-10-CM

## 2013-06-26 DIAGNOSIS — C189 Malignant neoplasm of colon, unspecified: Secondary | ICD-10-CM

## 2013-06-26 DIAGNOSIS — H539 Unspecified visual disturbance: Secondary | ICD-10-CM | POA: Diagnosis not present

## 2013-06-26 DIAGNOSIS — I10 Essential (primary) hypertension: Secondary | ICD-10-CM | POA: Diagnosis not present

## 2013-06-26 DIAGNOSIS — R911 Solitary pulmonary nodule: Secondary | ICD-10-CM | POA: Insufficient documentation

## 2013-06-26 NOTE — Progress Notes (Signed)
Subjective:    Patient ID: Debra Huber, female    DOB: [birth year], 63 y.o.   MRN: [id number]  HPI This chart was scribed for Remo Lipps Lanny Donoso-MD, by Lovena Le Day, Scribe. This patient was seen in room 22 and the patient's care was started at 3:09 PM.  HPI Comments: Debra Huber is a 63 y.o. female who presents to the [facility] for follow up colon cancer and thyroid cancer. She has had surgery for both. She required chemotherapy for her colon cancer.   In her last visit 11/18, she had not been back to the cancer center since Dr. Jamse Arn left. 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   She reports that lately she is caught up on all of her other medical appointments except that she still needs to schedule an appointment with her lung doctor.   She has not had the flu shot this year - mostly because she does not like needles.   Patient Active Problem List   Diagnosis Date Noted  . Macular degeneration 04/11/2012  . 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 Cancer of thyroid 03/08/2012  . Hypertension 07/20/2011  . Malignant neoplasm of colon, unspecified site 07/12/2011   Past Surgical History  Procedure Laterality Date  . Colectomy      partial  . Port-a-cath removal  11/04/11  . Needle bx thyroid    . Thyroidectomy  02/25/2012    Procedure: THYROIDECTOMY;  Surgeon: Pedro Earls, MD;  Location: [facility];  Service: General;;  . Hematoma evacuation  02/26/2012    Procedure: EVACUATION HEMATOMA;  Surgeon: Pedro Earls, MD;  Location: [facility];  Service: General;  Laterality: N/A;  irrigation and drainage of hematoma and placement of drain.   No family history on file.  History   Social History  . Marital Status: Single    Spouse Name: N/A    Number of Children: N/A  . Years of Education: N/A   Occupational History  . Not on file.   Social History Main Topics  .  Smoking status: Never Smoker   . Smokeless tobacco: Never Used  . Alcohol Use: No  . Drug Use: No  . Sexual Activity: Yes   Other Topics Concern  . Not on file   Social History Narrative  . No narrative on file   Allergies  Allergen Reactions  . Penicillins Swelling   Results for orders placed in visit on 05/22/13  CEA      Result Value Range   CEA <0.5  0.0 - 5.0 ng/mL   Review of Systems  Constitutional: Negative for fever and chills.  Respiratory: Negative for cough and shortness of breath.   Cardiovascular: Negative for chest pain.  Gastrointestinal: Negative for abdominal pain.  Musculoskeletal: Negative for back pain.      Objective:   Physical Exam Nursing note and vitals reviewed. Constitutional: Patient is oriented to person, place, and time. Patient appears well-developed and well-nourished. No distress.  HENT: There is a scar over the mid portion of the neck Head: Normocephalic and atraumatic.  Neck: Neck supple. No tracheal deviation present.  Cardiovascular: Normal rate, regular rhythm and normal heart sounds.   No murmur heard. Pulmonary/Chest: Effort normal and breath sounds normal. No respiratory distress. Patient has no wheezes. Patient has no rales.  Musculoskeletal: Normal range of motion.  Neurological: Patient is alert and  oriented to person, place, and time.  Skin: Skin is warm and dry.  Psychiatric: Patient has a normal mood and affect. Patient's behavior is normal.  ABD THERE are multiple abdominal scars    Assessment & Plan:  Blood pressure is better but not at goal. She put on a significant amount of  weight on a recent cruise. She is going to schedule herself to see the pulmonary doctor to followup on her CT scan which appears stable. She also knows she needs to have a followup ultrasound of the breast in february I personally performed the services described in this documentation, which was scribed in my presence. The recorded information has  been reviewed and is accurate.

## 2013-09-25 ENCOUNTER — Ambulatory Visit (INDEPENDENT_AMBULATORY_CARE_PROVIDER_SITE_OTHER): Payer: Medicare Other | Admitting: Emergency Medicine

## 2013-09-25 ENCOUNTER — Encounter: Payer: Self-pay | Admitting: Emergency Medicine

## 2013-09-25 VITALS — BP 163/101 | HR 52 | Temp 98.2°F | Resp 16 | Ht 66.75 in | Wt 156.0 lb

## 2013-09-25 DIAGNOSIS — Q649 Congenital malformation of urinary system, unspecified: Secondary | ICD-10-CM

## 2013-09-25 DIAGNOSIS — R350 Frequency of micturition: Secondary | ICD-10-CM | POA: Diagnosis not present

## 2013-09-25 DIAGNOSIS — R9389 Abnormal findings on diagnostic imaging of other specified body structures: Secondary | ICD-10-CM

## 2013-09-25 DIAGNOSIS — R911 Solitary pulmonary nodule: Secondary | ICD-10-CM

## 2013-09-25 DIAGNOSIS — C73 Malignant neoplasm of thyroid gland: Secondary | ICD-10-CM | POA: Diagnosis not present

## 2013-09-25 DIAGNOSIS — R42 Dizziness and giddiness: Secondary | ICD-10-CM

## 2013-09-25 DIAGNOSIS — I10 Essential (primary) hypertension: Secondary | ICD-10-CM

## 2013-09-25 DIAGNOSIS — N6310 Unspecified lump in the right breast, unspecified quadrant: Secondary | ICD-10-CM

## 2013-09-25 DIAGNOSIS — C189 Malignant neoplasm of colon, unspecified: Secondary | ICD-10-CM

## 2013-09-25 DIAGNOSIS — N63 Unspecified lump in unspecified breast: Secondary | ICD-10-CM

## 2013-09-25 LAB — CBC WITH DIFFERENTIAL/PLATELET
Basophils Absolute: 0 10*3/uL (ref 0.0–0.1)
Basophils Relative: 1 % (ref 0–1)
EOS PCT: 1 % (ref 0–5)
Eosinophils Absolute: 0 10*3/uL (ref 0.0–0.7)
HEMATOCRIT: 39.2 % (ref 36.0–46.0)
Hemoglobin: 13.5 g/dL (ref 12.0–15.0)
LYMPHS ABS: 1.4 10*3/uL (ref 0.7–4.0)
LYMPHS PCT: 31 % (ref 12–46)
MCH: 28.1 pg (ref 26.0–34.0)
MCHC: 34.4 g/dL (ref 30.0–36.0)
MCV: 81.7 fL (ref 78.0–100.0)
MONO ABS: 0.4 10*3/uL (ref 0.1–1.0)
Monocytes Relative: 9 % (ref 3–12)
NEUTROS ABS: 2.7 10*3/uL (ref 1.7–7.7)
Neutrophils Relative %: 58 % (ref 43–77)
PLATELETS: 324 10*3/uL (ref 150–400)
RBC: 4.8 MIL/uL (ref 3.87–5.11)
RDW: 15.1 % (ref 11.5–15.5)
WBC: 4.6 10*3/uL (ref 4.0–10.5)

## 2013-09-25 LAB — COMPLETE METABOLIC PANEL WITH GFR
ALBUMIN: 4 g/dL (ref 3.5–5.2)
ALK PHOS: 55 U/L (ref 39–117)
ALT: 11 U/L (ref 0–35)
AST: 28 U/L (ref 0–37)
BUN: 11 mg/dL (ref 6–23)
CALCIUM: 8.9 mg/dL (ref 8.4–10.5)
CHLORIDE: 100 meq/L (ref 96–112)
CO2: 29 mEq/L (ref 19–32)
Creat: 0.66 mg/dL (ref 0.50–1.10)
GFR, Est African American: >89 mL/min
GFR, Est Non African American: >89 mL/min
Glucose, Bld: 83 mg/dL (ref 70–99)
POTASSIUM: 4.2 meq/L (ref 3.5–5.3)
SODIUM: 137 meq/L (ref 135–145)
TOTAL PROTEIN: 7.1 g/dL (ref 6.0–8.3)
Total Bilirubin: 0.4 mg/dL (ref 0.2–1.2)

## 2013-09-25 LAB — GLUCOSE, POCT (MANUAL RESULT ENTRY): POC GLUCOSE: 87 mg/dL (ref 70–99)

## 2013-09-25 MED ORDER — CLONIDINE HCL 0.3 MG PO TABS: 0.3000 mg | ORAL_TABLET | Freq: Two times a day (BID) | ORAL | Status: DC

## 2013-09-25 MED ORDER — METOPROLOL SUCCINATE ER 25 MG PO TB24: 25.0000 mg | ORAL_TABLET | Freq: Every day | ORAL | Status: DC

## 2013-09-25 MED ORDER — LISINOPRIL 40 MG PO TABS: 40.0000 mg | ORAL_TABLET | Freq: Every day | ORAL | Status: DC

## 2013-09-25 MED ORDER — LEVOTHYROXINE SODIUM 125 MCG PO TABS: 125.0000 ug | ORAL_TABLET | Freq: Every day | ORAL | Status: DC

## 2013-09-25 MED ORDER — HYDROCHLOROTHIAZIDE 25 MG PO TABS: 25.0000 mg | ORAL_TABLET | Freq: Every day | ORAL | Status: DC

## 2013-09-25 MED ORDER — AMITRIPTYLINE HCL 25 MG PO TABS: 25.0000 mg | ORAL_TABLET | Freq: Every day | ORAL | Status: DC

## 2013-09-25 NOTE — Patient Instructions (Signed)
Return in 2 weeks for blood pressure recheck

## 2013-09-25 NOTE — Progress Notes (Addendum)
Subjective:    Patient ID: Debra Huber, female    DOB: Oct 22, 1950, 63 y.o.   MRN: [id number]  HPI This chart was scribed for Debra Russian, MD by Ladene Artist, ED Scribe. The patient was seen in room 21. Patient's care was started at 2:25 PM.  HPI Comments: Debra Huber is a 63 y.o. female, with history of thyroid disease and colon cancer, who presents to the [facility] for 3 month follow-up. Pt states that she takes her bp medication regularly but missed a dosage last night. She states that her bp medication is working but she reports her bp being elevated due to stress. Pt reports stress from a friend who has cancer in [facility] and is not doing well. She also reports work-related stress and relationship stress.   Pt reports more dizziness than normal and extremity weakness.  She admits to not taking her sleeping medications nightly but says that she will start taking them nightly due to sleep disturbances.   Pt states that she will exercise more and practice Yoga. Pt denies alcohol and cigarette use.  Pt's mammogram is scheduled. She is also scheduled to see her oncologist in June.  Pt has a history of the following:  1. Stage III (T3 N1), grade 2, adenocarcinoma of the ascending colon, status post a right hemicolectomy 11/17/2009, status post adjuvant FOLFOX for 12 cycles completed January 2012.  2. 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 Loanne Drilling.  3.B[place]in right breast biopsy 01/25/2013-hamartomatous lesion with calcifications.   4. Small bilateral pleural effusions on a chest CT 05/04/2013-she has been referred to pulmonary medicine.  Past Medical History  Diagnosis Date  . Anemia   . Hypertension   . Thyroid disease   . Hyperthyroidism     GRAVES DISEASE  . Colon cancer dx'd 11/2009    S/P HEMI-COLECTOMY AND FINISHED CHEMO 11/24/10  -DR. ODOGWU  . Complication of anesthesia     ALWAYS FEELS VERY SLEEPY WAKING UP  . Blood transfusion without reported diagnosis    Past Surgical History  Procedure Laterality Date  . Colectomy      partial  . Port-a-cath removal  11/04/11  . Needle bx thyroid    . Thyroidectomy  02/25/2012    Procedure: THYROIDECTOMY;  Surgeon: Pedro Earls, MD;  Location: [facility];  Service: General;;  . Hematoma evacuation  02/26/2012    Procedure: EVACUATION HEMATOMA;  Surgeon: Pedro Earls, MD;  Location: [facility];  Service: General;  Laterality: N/A;  irrigation and drainage of hematoma and placement of drain.   Allergies  Allergen Reactions  . Penicillins Swelling     Review of Systems  Neurological: Positive for dizziness and weakness.  Psychiatric/Behavioral: Positive for sleep disturbance.       Objective:   Physical Exam CONSTITUTIONAL: Well developed/well nourishedshe has a disarticulation with her speech and a strange affect but is very cooperative. HEAD: Normocephalic/atraumatic EYES: EOMI/PERRL ENMT: Mucous membranes moist NECK: supple no meningeal signs there is a surgical scar from thyroid surgery. SPINE:entire spine nontender CV: S1/S2 noted, no murmurs/rubs/gallops noted LUNGS: Lungs are clear to auscultation bilaterally, no apparent distress ABDOMEN: soft, nontender, no rebound or guarding there are multiple healed incisions. GU:no cva tenderness NEURO: Pt is awake/alert, moves all extremitiesx4 EXTREMITIES: pulses normal, full ROM SKIN: warm, color normal PSYCH: no abnormalities of mood noted  Assessment & Plan:  Patient's blood pressure was up significantly. I did increase her ACE inhibitor to 40 mg a day. I made the referrals back to pulmonary for evaluation of pleural effusions and pulmonary nodule. I referred her for CT of the head because of her recent complaints of dizziness.ambulatory referral to endocrinology also made for followup of her thyroid cancer  .referral also made for mammography. She had previous biopsy of a hamartoma   I personally performed the services described in this documentation, which was scribed in my presence. The recorded information has been reviewed and is accurate.

## 2013-09-26 LAB — THYROGLOBULIN LEVEL

## 2013-09-26 LAB — TSH: TSH: 2.678 u[IU]/mL (ref 0.350–4.500)

## 2013-09-28 ENCOUNTER — Ambulatory Visit (INDEPENDENT_AMBULATORY_CARE_PROVIDER_SITE_OTHER): Payer: Medicare Other | Admitting: Internal Medicine

## 2013-09-28 ENCOUNTER — Encounter: Payer: Self-pay | Admitting: Internal Medicine

## 2013-09-28 VITALS — BP 156/108 | HR 57 | Ht 67.0 in | Wt 160.0 lb

## 2013-09-28 DIAGNOSIS — R911 Solitary pulmonary nodule: Secondary | ICD-10-CM | POA: Diagnosis not present

## 2013-09-28 NOTE — Patient Instructions (Signed)
Right Upper Lobe Lung Nodule - stable March 2014 and November 2014  - repeat followup CT chest sometime now  Will call with results  And decide followup 

## 2013-09-28 NOTE — Progress Notes (Signed)
Subjective:    Patient ID: Debra Huber, female    DOB: Oct 20, 1950, 63 y.o.   MRN: [id number]  HPI  IOV 09/28/2013  Chief Complaint  Patient presents with  . Advice Only    Referred by Dr Everlene Farrier for abnormal ct.  Pt states she has no breathing complaints at this time.  C.o dizziness.     63 year old AA female Non smoker but been around passive smoke. Colon cancer ins approx 2011 and then thyroid cancer 2013. Says likes to avoid docs after thyroid surgery experience was not great NOS. Denies all respiratory complaints. Had CT chest March and Nov 2014 and stable RUL nodule, suspected as impacted mucus. Also, CT with calcified  mdiastinal nodes c/w granuloma ? Burnt out sarcoid  TECHNIQUE: 04/15/10/4 Multidetector CT imaging of the chest was performed during  intravenous contrast administration.  CONTRAST: 57mL OMNIPAQUE IOHEXOL 300 MG/ML SOLN  COMPARISON: 09/04/2012  FINDINGS:  Very small pleural effusions are identified bilaterally. New from  previous exam. No airspace consolidation or atelectasis identified.  Irregular tubular in nodular density in the right upper lobe is  favored to represent a small impacted bronchial. This is unchanged  from previous exam. No new or enlarging suspicious pulmonary nodules  are mass is noted.  The trachea appears patent and is midline. Mild cardiac enlargement.  Calcified mediastinal lymph nodes are again noted compatible with  prior granulomatous disease.  No enlarged axillary or supraclavicular lymph nodes. Incidental  imaging through the upper abdomen is on unremarkable. The visualized  osseous structures are remarkable for mild spondylosis within the  thoracic spine. Several vertebral hemangiomas are noted.  IMPRESSION:  1. Stable nodular density in the right upper lobe which is favored  to represent and impacted bronchial, likely postinflammatory or  infectious. No new or worrisome nodules are mass is noted.  2. Prior granulomatous  disease.  3. Small pleural effusions  Electronically Signed  By: Kerby Moors M.D.  On: 05/04/2013 14:45          Past Medical History  Diagnosis Date  . Anemia   . Hypertension   . Thyroid disease   . Hyperthyroidism     GRAVES DISEASE  . Colon cancer dx'd 11/2009    S/P HEMI-COLECTOMY AND FINISHED CHEMO 11/24/10 -DR. ODOGWU  . Complication of anesthesia     ALWAYS FEELS VERY SLEEPY WAKING UP  . Blood transfusion without reported diagnosis      No family history on file.   History   Social History  . Marital Status: Single    Spouse Name: N/A    Number of Children: N/A  . Years of Education: N/A   Occupational History  . Not on file.   Social History Main Topics  . Smoking status: Never Smoker   . Smokeless tobacco: Never Used     Comment: Pt states she has been exposed to lots of secondhand smoke.    Marland Kitchen Alcohol Use: No  . Drug Use: No  . Sexual Activity: Yes   Other Topics Concern  . Not on file   Social History Narrative  . No narrative on file     Allergies  Allergen Reactions  . Penicillins Swelling     Outpatient Prescriptions Prior to Visit  Medication Sig Dispense Refill  . amitriptyline (ELAVIL) 25 MG tablet Take 1 tablet (25 mg total) by mouth at bedtime.  90 tablet  1  . cloNIDine (CATAPRES) 0.3 MG tablet Take 1 tablet (0.3 mg  total) by mouth 2 (two) times daily.  180 tablet  1  . levothyroxine (SYNTHROID, LEVOTHROID) 125 MCG tablet Take 1 tablet (125 mcg total) by mouth daily.  90 tablet  1  . lisinopril (PRINIVIL,ZESTRIL) 40 MG tablet Take 1 tablet (40 mg total) by mouth daily.  90 tablet  1  . metoprolol succinate (TOPROL-XL) 25 MG 24 hr tablet Take 1 tablet (25 mg total) by mouth daily with breakfast.  90 tablet  1  . Multiple Vitamins-Minerals (CENTRUM SILVER) CHEW Chew 1 tablet by mouth daily.       . vitamin E (VITAMIN E) 1000 UNIT capsule Take 1,000 Units by mouth daily.      . hydrochlorothiazide (HYDRODIURIL) 25 MG tablet Take 1  tablet (25 mg total) by mouth daily with breakfast.  90 tablet  1   No facility-administered medications prior to visit.      Review of Systems  Constitutional: Negative for fever and unexpected weight change.  HENT: Positive for postnasal drip and sinus pressure. Negative for congestion, dental problem, ear pain, nosebleeds, rhinorrhea, sneezing, sore throat and trouble swallowing.   Eyes: Negative for redness and itching.  Respiratory: Negative for cough, chest tightness, shortness of breath and wheezing.   Cardiovascular: Negative for palpitations and leg swelling.  Gastrointestinal: Negative for nausea and vomiting.  Genitourinary: Negative for dysuria.  Musculoskeletal: Negative for joint swelling.  Skin: Negative for rash.  Neurological: Positive for dizziness. Negative for headaches.  Hematological: Does not bruise/bleed easily.  Psychiatric/Behavioral: Negative for dysphoric mood. The patient is not nervous/anxious.        Objective:   Physical Exam  Vitals reviewed. Constitutional: She is oriented to person, place, and time. She appears well-developed and well-nourished. No distress.  HENT:  Head: Normocephalic and atraumatic.  Right Ear: External ear normal.  Left Ear: External ear normal.  Mouth/Throat: Oropharynx is clear and moist. No oropharyngeal exudate.  Scar in neck   Eyes: Conjunctivae and EOM are normal. Pupils are equal, round, and reactive to light. Right eye exhibits no discharge. Left eye exhibits no discharge. No scleral icterus.  Neck: Normal range of motion. Neck supple. No JVD present. No tracheal deviation present. No thyromegaly present.  Cardiovascular: Normal rate, regular rhythm, normal heart sounds and intact distal pulses.  Exam reveals no gallop and no friction rub.   No murmur heard. Pulmonary/Chest: Effort normal and breath sounds normal. No respiratory distress. She has no wheezes. She has no rales. She exhibits no tenderness.  Abdominal:  Soft. Bowel sounds are normal. She exhibits no distension and no mass. There is no tenderness. There is no rebound and no guarding.  Musculoskeletal: Normal range of motion. She exhibits no edema and no tenderness.  Lymphadenopathy:    She has no cervical adenopathy.  Neurological: She is alert and oriented to person, place, and time. She has normal reflexes. No cranial nerve deficit. She exhibits normal muscle tone. Coordination normal.  Skin: Skin is warm and dry. No rash noted. She is not diaphoretic. No erythema. No pallor.  Psychiatric: She has a normal mood and affect. Her behavior is normal. Judgment and thought content normal.          Assessment & Plan:

## 2013-09-30 NOTE — Assessment & Plan Note (Signed)
Right Upper Lobe Lung Nodule - stable March 2014 and November 2014  - repeat followup CT chest sometime now  Will call with results  And decide followup

## 2013-10-05 ENCOUNTER — Ambulatory Visit (INDEPENDENT_AMBULATORY_CARE_PROVIDER_SITE_OTHER)
Admission: RE | Admit: 2013-10-05 | Discharge: 2013-10-05 | Disposition: A | Discharge status: Home / Self Care | Payer: Medicare Other | Source: Ambulatory Visit | Attending: Internal Medicine | Admitting: Internal Medicine

## 2013-10-05 ENCOUNTER — Telehealth: Payer: Self-pay | Admitting: Internal Medicine

## 2013-10-05 DIAGNOSIS — R911 Solitary pulmonary nodule: Secondary | ICD-10-CM

## 2013-10-05 DIAGNOSIS — J984 Other disorders of lung: Secondary | ICD-10-CM | POA: Diagnosis not present

## 2013-10-05 NOTE — Telephone Encounter (Signed)
Ct Chest Wo Contrast  10/05/2013   CLINICAL DATA:  Follow-up right upper lobe nodule. Prior history of colon and thyroid cancer.  EXAM: CT CHEST WITHOUT CONTRAST  TECHNIQUE: Multidetector CT imaging of the chest was performed following the standard protocol without IV contrast.  COMPARISON:  Multiple priors, most recently 05/04/2013.  FINDINGS: Mucoid impaction within a right upper lobe bronchus (series 3/images 14-15). Additional post infectious/ inflammatory scarring in the central right upper lobe (series 3/ image 12).  Stable 2-3 mm nodules in the lateral right upper lobe (series 3/ images 14 and 17), benign.  No new/suspicious pulmonary nodules. No pleural effusion or pneumothorax.  Status post thyroidectomy.  Heart is top-normal in size. No pericardial effusion. Atherosclerotic calcifications of the aortic arch.  Calcified mediastinal and right hilar lymph nodes. No suspicious mediastinal or axillary lymphadenopathy.  Visualized upper abdomen is grossly unremarkable.  Degenerative changes of the visualized thoracolumbar spine.  IMPRESSION: Mucoid impaction within a right upper lobe bronchus with adjacent post infectious/inflammatory scarring.  No new/suspicious pulmonary nodules.  Status post thyroidectomy.   Electronically Signed   By: Julian Hy M.D.   On: 10/05/2013 15:15   CT chest shows stable RUL mucoid impactions/nodule.  Plan Please order CT chest for November 2015  OV after that scan in nov 2015

## 2013-10-09 ENCOUNTER — Encounter: Payer: Self-pay | Admitting: Emergency Medicine

## 2013-10-09 ENCOUNTER — Encounter: Payer: Self-pay | Admitting: *Deleted

## 2013-10-09 ENCOUNTER — Ambulatory Visit (INDEPENDENT_AMBULATORY_CARE_PROVIDER_SITE_OTHER): Payer: Medicare Other | Admitting: Emergency Medicine

## 2013-10-09 VITALS — BP 138/90 | HR 54 | Temp 98.1°F | Resp 18 | Ht 67.0 in | Wt 158.0 lb

## 2013-10-09 DIAGNOSIS — I10 Essential (primary) hypertension: Secondary | ICD-10-CM | POA: Diagnosis not present

## 2013-10-09 DIAGNOSIS — R9389 Abnormal findings on diagnostic imaging of other specified body structures: Secondary | ICD-10-CM

## 2013-10-09 DIAGNOSIS — C189 Malignant neoplasm of colon, unspecified: Secondary | ICD-10-CM

## 2013-10-09 NOTE — Progress Notes (Signed)
   This chart was scribed for Debra Russian, MD by Elby Beck, ED Scribe. This patient was seen in room 21 and the patient's care was started at 12:30 PM.  Subjective:    Patient ID: Debra Huber, female    DOB: August 07, 1950, 63 y.o.   MRN: [id number]  HPI  HPI Comments: [name] is a 63 y.o. female who presents to the [facility] for a follow-up evaluation of elevated blood pressure. Her medications were increased last visit. Since her last visit here, she has had her CT of her chest. This revealed no new nodules. She is scheduled for a mammogram and a CT head, which she should get completed within the next week. She is under a lot of stress at home, involving her son who was involved in a shooting, where a man was killed. Her son was not the shooter.    Review of Systems     Objective:   Physical Exam  CONSTITUTIONAL: Well developed/well nourished HEAD: Normocephalic/atraumatic EYES: EOMI/PERRL ENMT: Mucous membranes moist NECK: supple no meningeal signs SPINE:entire spine nontender CV: S1/S2 noted, no murmurs/rubs/gallops noted LUNGS: Lungs are clear to auscultation bilaterally, no apparent distress ABDOMEN: soft, nontender, no rebound or guarding GU:no cva tenderness NEURO: Pt is awake/alert, moves all extremitiesx4 EXTREMITIES: pulses normal, full ROM SKIN: warm, color normal PSYCH: no abnormalities of mood noted     BP 138/90  Pulse 54  Temp(Src) 98.1 F (36.7 C)  Resp 18  Ht 5\' 7"  (1.702 m)  Wt 158 lb (71.668 kg)  BMI 24.74 kg/m2  SpO2 98% Assessment & Plan:  No diagnosis found. blood pressure is better controlled. She has had her CT chest with no new nodular areas. There is a mucoid collection in the right upper lob she will proceed with CT head and mammogram   I personally performed the services described in this documentation, which was scribed in my presence. The recorded information has been reviewed and is accurate.

## 2013-10-10 NOTE — Telephone Encounter (Signed)
LMTCBx1.Jennifer Castillo, CMA  

## 2013-10-10 NOTE — Telephone Encounter (Signed)
[phone] returning a call

## 2013-10-16 NOTE — Telephone Encounter (Signed)
LMTCBx2. Raihan Kimmel, CMA  

## 2013-10-19 ENCOUNTER — Ambulatory Visit
Admission: RE | Admit: 2013-10-19 | Discharge: 2013-10-19 | Disposition: A | Discharge status: Home / Self Care | Payer: Medicare Other | Source: Ambulatory Visit | Attending: Emergency Medicine | Admitting: Emergency Medicine

## 2013-10-19 DIAGNOSIS — N6310 Unspecified lump in the right breast, unspecified quadrant: Secondary | ICD-10-CM

## 2013-10-19 DIAGNOSIS — R922 Inconclusive mammogram: Secondary | ICD-10-CM | POA: Diagnosis not present

## 2013-10-22 ENCOUNTER — Encounter: Payer: Self-pay | Admitting: Endocrinology

## 2013-10-22 ENCOUNTER — Ambulatory Visit (INDEPENDENT_AMBULATORY_CARE_PROVIDER_SITE_OTHER): Payer: Medicare Other | Admitting: Endocrinology

## 2013-10-22 VITALS — BP 116/80 | HR 53 | Temp 98.1°F | Ht 67.0 in | Wt 156.0 lb

## 2013-10-22 DIAGNOSIS — C73 Malignant neoplasm of thyroid gland: Secondary | ICD-10-CM | POA: Insufficient documentation

## 2013-10-22 NOTE — Patient Instructions (Signed)
Please continue the same thyroid medication.  All you need for the thyroid cancer is a doctor to examine your neck each year.  i would be happy to do this, or I'm sure Dr Everlene Farrier would be willing to also.

## 2013-10-22 NOTE — Progress Notes (Signed)
Subjective:    Patient ID: Debra Huber, female    DOB: 1951/01/13, 63 y.o.   MRN: [id number]  HPI Pt returns for f/u of follicular variant of papillary thyroid carcinoma (in 2013, pt underwent thyroidectomy for a nodule which was suspicious on fine-needle bx; pathology showed 9 mm follicular variant of papillary thyroid carcinoma (T1a N0 M0).  She does not notce any nodule at the neck.  She takes synthroid as rx'ed.   Past Medical History  Diagnosis Date  . Anemia   . Hypertension   . Thyroid disease   . Hyperthyroidism     GRAVES DISEASE  . Colon cancer dx'd 11/2009    S/P HEMI-COLECTOMY AND FINISHED CHEMO 11/24/10 -DR. ODOGWU  . Complication of anesthesia     ALWAYS FEELS VERY SLEEPY WAKING UP  . Blood transfusion without reported diagnosis     Past Surgical History  Procedure Laterality Date  . Colectomy      partial  . Port-a-cath removal  11/04/11  . Needle bx thyroid    . Thyroidectomy  02/25/2012    Procedure: THYROIDECTOMY;  Surgeon: Pedro Earls, MD;  Location: [facility];  Service: General;;  . Hematoma evacuation  02/26/2012    Procedure: EVACUATION HEMATOMA;  Surgeon: Pedro Earls, MD;  Location: [facility];  Service: General;  Laterality: N/A;  irrigation and drainage of hematoma and placement of drain.    History   Social History  . Marital Status: Single    Spouse Name: N/A    Number of Children: N/A  . Years of Education: N/A   Occupational History  . Not on file.   Social History Main Topics  . Smoking status: Never Smoker   . Smokeless tobacco: Never Used     Comment: Pt states she has been exposed to lots of secondhand smoke.    Marland Kitchen Alcohol Use: No  . Drug Use: No  . Sexual Activity: Yes   Other Topics Concern  . Not on file   Social History Narrative  . No narrative on file    Current Outpatient Prescriptions on File Prior to Visit  Medication Sig Dispense Refill  . amitriptyline (ELAVIL) 25 MG tablet Take 1 tablet (25 mg total) by mouth  at bedtime.  90 tablet  1  . cloNIDine (CATAPRES) 0.3 MG tablet Take 1 tablet (0.3 mg total) by mouth 2 (two) times daily.  180 tablet  1  . hydrochlorothiazide (HYDRODIURIL) 25 MG tablet Take 40 mg by mouth daily with breakfast.      . levothyroxine (SYNTHROID, LEVOTHROID) 125 MCG tablet Take 1 tablet (125 mcg total) by mouth daily.  90 tablet  1  . lisinopril (PRINIVIL,ZESTRIL) 40 MG tablet Take 1 tablet (40 mg total) by mouth daily.  90 tablet  1  . metoprolol succinate (TOPROL-XL) 25 MG 24 hr tablet Take 1 tablet (25 mg total) by mouth daily with breakfast.  90 tablet  1  . Multiple Vitamins-Minerals (CENTRUM SILVER) CHEW Chew 1 tablet by mouth daily.       . vitamin E (VITAMIN E) 1000 UNIT capsule Take 1,000 Units by mouth daily.       No current facility-administered medications on file prior to visit.    Allergies  Allergen Reactions  . Penicillins Swelling    Family History  Problem Relation Age of Onset  . Cancer Father     prostat  . Hypertension Sister   . Thyroid disease Sister   . Hypertension Brother   .  Hypertension Sister   . Thyroid disease Sister   . Hypertension Sister   . Hypertension Sister   . Hypertension Sister   . Hypertension Sister   . Hypertension Brother     BP 116/80  Pulse 53  Temp(Src) 98.1 F (36.7 C) (Oral)  Ht 5\' 7"  (1.702 m)  Wt 156 lb (70.761 kg)  BMI 24.43 kg/m2  SpO2 97%  Review of Systems She has slight lightheadedness.  She denies significant weight change.     Objective:   Physical Exam VITAL SIGNS:  See vs page GENERAL: no distress Neck: a healed scar is present.  i do not appreciate a nodule in the thyroid or elsewhere in the neck.   Lab Results  Component Value Date   TSH 2.678 09/25/2013      Assessment & Plan:  Small focus of differentiated thyroid cancer.  No f/u needed, except for annual physical exam of the neck.   Postsurgical hypothyroidism: well-replaced.

## 2013-10-23 NOTE — Telephone Encounter (Signed)
LMTCBx3. Imogen Maddalena, CMA  

## 2013-10-24 ENCOUNTER — Ambulatory Visit
Admission: RE | Admit: 2013-10-24 | Discharge: 2013-10-24 | Disposition: A | Discharge status: Home / Self Care | Payer: Medicare Other | Source: Ambulatory Visit | Attending: Emergency Medicine | Admitting: Emergency Medicine

## 2013-10-24 DIAGNOSIS — I6350 Cerebral infarction due to unspecified occlusion or stenosis of unspecified cerebral artery: Secondary | ICD-10-CM | POA: Diagnosis not present

## 2013-10-24 DIAGNOSIS — R42 Dizziness and giddiness: Secondary | ICD-10-CM

## 2013-10-25 ENCOUNTER — Other Ambulatory Visit: Payer: Self-pay | Admitting: Family Medicine

## 2013-10-25 DIAGNOSIS — I6381 Other cerebral infarction due to occlusion or stenosis of small artery: Secondary | ICD-10-CM

## 2013-10-25 NOTE — Telephone Encounter (Signed)
LMTCBx4. Jennifer Castillo, CMA  

## 2013-10-26 ENCOUNTER — Encounter: Payer: Self-pay | Admitting: *Deleted

## 2013-10-26 NOTE — Telephone Encounter (Signed)
Pt advised and order placed. Shara Hartis, CMA  

## 2013-11-07 ENCOUNTER — Telehealth: Payer: Self-pay

## 2013-11-07 NOTE — Telephone Encounter (Signed)
Lm for rtn call 

## 2013-11-07 NOTE — Telephone Encounter (Signed)
Patient would like to talk with her MD regarding her last OV.   [phone]

## 2013-11-09 NOTE — Telephone Encounter (Signed)
Pt is trying to have the provider changed on her medicaid card to Dr. Everlene Farrier. She states that she is unable since we do not accept medicaid. Pt is going to contact the provider listed on the card and see if they can put the referral in for the neurologist. Asked pt to give me a call back to update me on the referral from the PCP listed on her medicaid card. She did not have the card with her at the time of the call.

## 2013-11-13 ENCOUNTER — Other Ambulatory Visit (HOSPITAL_BASED_OUTPATIENT_CLINIC_OR_DEPARTMENT_OTHER): Payer: Medicare Other

## 2013-11-13 ENCOUNTER — Ambulatory Visit (HOSPITAL_BASED_OUTPATIENT_CLINIC_OR_DEPARTMENT_OTHER): Payer: Medicare Other | Admitting: Oncology

## 2013-11-13 VITALS — BP 155/106 | HR 62 | Temp 98.0°F | Resp 18 | Ht 67.0 in | Wt 158.2 lb

## 2013-11-13 DIAGNOSIS — I10 Essential (primary) hypertension: Secondary | ICD-10-CM | POA: Diagnosis not present

## 2013-11-13 DIAGNOSIS — C182 Malignant neoplasm of ascending colon: Secondary | ICD-10-CM

## 2013-11-13 DIAGNOSIS — C189 Malignant neoplasm of colon, unspecified: Secondary | ICD-10-CM

## 2013-11-13 LAB — CEA: CEA: <0.5 ng/mL (ref 0.0–5.0)

## 2013-11-13 NOTE — Progress Notes (Signed)
  Debra Huber OFFICE PROGRESS NOTE   Diagnosis: Colon cancer   INTERVAL HISTORY:   She returns as scheduled. She complains of intermittent headaches and malaise. She is followed by Dr. Everlene Farrier and underwent a CT of the brain 10/26/2013 that revealed small lacunar infarcts. She reports she has been referred to neurology.  A colonoscopy 05/17/2013. A sessile polyps were removed from the descending colon and rectum.  She was evaluated by pulmonary medicine for evaluation of a lung nodule and pleural effusions. A CT 10/05/2013 revealed no suspicious pulmonary nodules and no pleural effusion.   Objective:  Vital signs in last 24 hours:  Blood pressure 155/106, pulse 62, temperature 98 F (36.7 C), temperature source Oral, resp. rate 18, height 5\' 7"  (1.702 m), weight 158 lb 3.2 oz (71.759 kg), SpO2 100.00%.    HEENT: Neck without mass Lymphatics: No cervical, supraclavicular, axillary, or inguinal nodes Resp: Lungs clear bilaterally Cardio: Regular rate and rhythm GI: No hepatosplenomegaly, no mass Vascular: No leg edema   Lab Results:  Lab Results  Component Value Date   WBC 4.6 09/25/2013   HGB 13.5 09/25/2013   HCT 39.2 09/25/2013   MCV 81.7 09/25/2013   PLT 324 09/25/2013   NEUTROABS 2.7 09/25/2013    Lab Results  Component Value Date   CEA <0.5 05/22/2013    Imaging:  No results found.  Medications: I have reviewed the patient's current medications.  Assessment/Plan: 1. Stage III (T3 N1), grade 2, adenocarcinoma of the ascending colon, stAnd a result thatatus post a right hemicolectomy 11/17/2009, status post adjuvant FOLFOX for 12 cycles completed January 2012   Colonoscopy December 2014, status post removal of tubular adenomas from the descending colon and rectum  2. 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 Loanne Drilling  3. Benign right  breast biopsy 01/25/2013-hamartomatous lesion with calcifications  4. Small bilateral pleural effusions on a chest CT 05/04/2013,no effusion on the CT 10/05/13 5. Hypertension   Disposition:  She remains in remmisson from colon cancer. She will continue colonoscopy f/u with Dr. Penelope Coop.  She will return for an office visit in 6 months. I recommended she f/u with Dr. Everlene Farrier for management of hypertension.  Ladell Pier, MD  11/13/2013  9:40 AM

## 2013-11-14 ENCOUNTER — Telehealth: Payer: Self-pay | Admitting: *Deleted

## 2013-11-14 ENCOUNTER — Telehealth: Payer: Self-pay | Admitting: Oncology

## 2013-11-14 NOTE — Telephone Encounter (Signed)
lvm for opt regarding to Dec appt...mailed pt appt sched/avs and letter

## 2013-11-14 NOTE — Telephone Encounter (Signed)
Message copied by Tania Ade on Wed Nov 14, 2013  4:10 PM ------      Message from: Ladell Pier      Created: Tue Nov 13, 2013  6:04 PM       Please call patient, cea is normal ------

## 2013-11-14 NOTE — Telephone Encounter (Signed)
Left VM that CEA is normal

## 2014-01-08 ENCOUNTER — Ambulatory Visit: Payer: Medicare Other | Admitting: Emergency Medicine

## 2014-01-14 ENCOUNTER — Ambulatory Visit: Payer: Medicare Other | Admitting: Emergency Medicine

## 2014-01-25 ENCOUNTER — Ambulatory Visit: Payer: Medicare Other | Admitting: Emergency Medicine

## 2014-01-28 ENCOUNTER — Encounter: Payer: Self-pay | Admitting: Emergency Medicine

## 2014-01-28 ENCOUNTER — Ambulatory Visit (INDEPENDENT_AMBULATORY_CARE_PROVIDER_SITE_OTHER): Payer: Medicare Other | Admitting: Emergency Medicine

## 2014-01-28 ENCOUNTER — Telehealth: Payer: Self-pay | Admitting: Family Medicine

## 2014-01-28 ENCOUNTER — Ambulatory Visit: Payer: Medicare Other | Admitting: Emergency Medicine

## 2014-01-28 VITALS — BP 201/109 | HR 45 | Temp 98.2°F | Resp 16 | Ht 66.0 in | Wt 156.0 lb

## 2014-01-28 DIAGNOSIS — I6350 Cerebral infarction due to unspecified occlusion or stenosis of unspecified cerebral artery: Secondary | ICD-10-CM

## 2014-01-28 DIAGNOSIS — I498 Other specified cardiac arrhythmias: Secondary | ICD-10-CM | POA: Diagnosis not present

## 2014-01-28 DIAGNOSIS — R030 Elevated blood-pressure reading, without diagnosis of hypertension: Secondary | ICD-10-CM | POA: Diagnosis not present

## 2014-01-28 DIAGNOSIS — F439 Reaction to severe stress, unspecified: Secondary | ICD-10-CM

## 2014-01-28 DIAGNOSIS — Z733 Stress, not elsewhere classified: Secondary | ICD-10-CM | POA: Diagnosis not present

## 2014-01-28 DIAGNOSIS — R001 Bradycardia, unspecified: Secondary | ICD-10-CM | POA: Insufficient documentation

## 2014-01-28 DIAGNOSIS — IMO0001 Reserved for inherently not codable concepts without codable children: Secondary | ICD-10-CM

## 2014-01-28 NOTE — Patient Instructions (Signed)
Decrease your toprol/ metoprolol only take one half/ return to the office any time between 8 and 5 for a blood pressure recheck/ and to recheck of your pulse rate/ ask for Debra Huber

## 2014-01-28 NOTE — Telephone Encounter (Signed)
Spoke with patient and let her know about appointment with Dr. Einar Gip on 01/28/14 at 2 pm.  She is unable to make that appointment so she is going to call them to reschedule when it is convenient for her.  Also informed her that if she does have any trouble to give [place] a call back

## 2014-01-28 NOTE — Progress Notes (Signed)
   Subjective:    Patient ID: SHANA ZAVALETA, female    DOB: 27-Aug-1950, 63 y.o.   MRN: [id number]  HPI is here to recheck on her blood pressure. She is currently on Toprol and Catapres and Zestril. She has been exercising on a regular basis. She states she feels much better than when she was last here the stress she was under with her sons problems up in [place] are improved. She is working regular. When she feels tired during the day she takes a nap   Review of Systems     Objective:   Physical Exam patient is alert and cooperative. Chest clear heart was regular rate no murmurs very slow rate. The abdomen is soft nontender there are multiple scars over the abdomen. There is also a scar present over the neck appear  EKG diffuse biphasic T wave changes this is a change from previous      Assessment & Plan:   referral made to Dr. 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 We will decrease her Toprol to half tablet a day to see if this helps with the bradycardia. If he continues to be a problem we'll also need to decrease her Catapres from 0.3-0.2. She's currently taking this twice a day. Recheck 3 months

## 2014-01-29 LAB — CBC WITH DIFFERENTIAL/PLATELET
Basophils Absolute: 0 10*3/uL (ref 0.0–0.1)
Basophils Relative: 1 % (ref 0–1)
Eosinophils Absolute: 0 10*3/uL (ref 0.0–0.7)
Eosinophils Relative: 1 % (ref 0–5)
HCT: 42.3 % (ref 36.0–46.0)
Hemoglobin: 14.3 g/dL (ref 12.0–15.0)
LYMPHS ABS: 1.2 10*3/uL (ref 0.7–4.0)
LYMPHS PCT: 27 % (ref 12–46)
MCH: 27.8 pg (ref 26.0–34.0)
MCHC: 33.8 g/dL (ref 30.0–36.0)
MCV: 82.1 fL (ref 78.0–100.0)
MONOS PCT: 7 % (ref 3–12)
Monocytes Absolute: 0.3 10*3/uL (ref 0.1–1.0)
Neutro Abs: 2.8 10*3/uL (ref 1.7–7.7)
Neutrophils Relative %: 64 % (ref 43–77)
PLATELETS: 290 10*3/uL (ref 150–400)
RBC: 5.15 MIL/uL — ABNORMAL HIGH (ref 3.87–5.11)
RDW: 15.5 % (ref 11.5–15.5)
WBC: 4.3 10*3/uL (ref 4.0–10.5)

## 2014-01-29 LAB — BASIC METABOLIC PANEL WITH GFR
BUN: 8 mg/dL (ref 6–23)
CHLORIDE: 101 meq/L (ref 96–112)
CO2: 29 meq/L (ref 19–32)
Calcium: 9.1 mg/dL (ref 8.4–10.5)
Creat: 0.78 mg/dL (ref 0.50–1.10)
GFR, Est African American: >89 mL/min
GFR, Est Non African American: 82 mL/min
Glucose, Bld: 90 mg/dL (ref 70–99)
Potassium: 3.9 mEq/L (ref 3.5–5.3)
SODIUM: 139 meq/L (ref 135–145)

## 2014-01-30 DIAGNOSIS — I10 Essential (primary) hypertension: Secondary | ICD-10-CM | POA: Diagnosis not present

## 2014-01-30 DIAGNOSIS — I498 Other specified cardiac arrhythmias: Secondary | ICD-10-CM | POA: Diagnosis not present

## 2014-01-31 DIAGNOSIS — I059 Rheumatic mitral valve disease, unspecified: Secondary | ICD-10-CM | POA: Diagnosis not present

## 2014-01-31 DIAGNOSIS — I10 Essential (primary) hypertension: Secondary | ICD-10-CM | POA: Diagnosis not present

## 2014-02-13 ENCOUNTER — Encounter: Payer: Self-pay | Admitting: Emergency Medicine

## 2014-02-27 ENCOUNTER — Encounter: Payer: Self-pay | Admitting: Radiology

## 2014-02-27 ENCOUNTER — Telehealth: Payer: Self-pay | Admitting: Radiology

## 2014-02-27 NOTE — Telephone Encounter (Signed)
Patient came in today for BP recheck as I requested for her to do. Her blood pressure is 130/98 and her pulse rate is 58 she states she feels better, has had more energy and would like to keep meds the same for now.

## 2014-02-27 NOTE — Telephone Encounter (Signed)
This sounds good. Thanks

## 2014-02-27 NOTE — Progress Notes (Signed)
   Subjective:    Patient ID: Debra Huber, female    DOB: 1950/11/30, 63 y.o.   MRN: [id number]  HPI Patient presents for me to recheck her blood pressure and pulse rate, as her pulse rate has been low last few visits. It has been a couple weeks since Dr Everlene Farrier adjusted her medications.   Review of Systems    Patient reports she feels better since meds were adjusted by Dr Everlene Farrier Objective:   Physical Exam  Patient in no distress, speaks comfortably, and reports feeling better.       Assessment & Plan:  Blood pressure and pulse rates are sent to Dr Everlene Farrier to advise further.

## 2014-03-14 ENCOUNTER — Encounter (INDEPENDENT_AMBULATORY_CARE_PROVIDER_SITE_OTHER): Payer: Medicare Other | Admitting: Ophthalmology

## 2014-03-21 ENCOUNTER — Other Ambulatory Visit: Payer: Self-pay | Admitting: Emergency Medicine

## 2014-03-25 ENCOUNTER — Encounter (INDEPENDENT_AMBULATORY_CARE_PROVIDER_SITE_OTHER): Payer: Medicare Other | Admitting: Ophthalmology

## 2014-03-25 DIAGNOSIS — H4423 Degenerative myopia, bilateral: Secondary | ICD-10-CM

## 2014-03-25 DIAGNOSIS — H43813 Vitreous degeneration, bilateral: Secondary | ICD-10-CM

## 2014-03-25 DIAGNOSIS — I10 Essential (primary) hypertension: Secondary | ICD-10-CM

## 2014-03-25 DIAGNOSIS — H35033 Hypertensive retinopathy, bilateral: Secondary | ICD-10-CM

## 2014-04-12 ENCOUNTER — Other Ambulatory Visit: Payer: Self-pay | Admitting: Emergency Medicine

## 2014-04-12 NOTE — Telephone Encounter (Signed)
Dr Everlene Farrier, you saw pt in Aug, but no TSH since Apr. OK to give RFs?

## 2014-04-29 ENCOUNTER — Inpatient Hospital Stay: Admission: RE | Admit: 2014-04-29 | Payer: Medicare Other | Source: Ambulatory Visit

## 2014-05-07 ENCOUNTER — Ambulatory Visit: Payer: Medicare Other | Admitting: Emergency Medicine

## 2014-05-15 ENCOUNTER — Ambulatory Visit: Payer: Medicare Other | Admitting: Nurse Practitioner

## 2014-05-15 ENCOUNTER — Other Ambulatory Visit: Payer: Medicare Other

## 2014-07-16 ENCOUNTER — Telehealth: Payer: Self-pay | Admitting: Nurse Practitioner

## 2014-07-16 NOTE — Telephone Encounter (Signed)
Patient confirmed appointment for 02/05

## 2014-07-19 ENCOUNTER — Other Ambulatory Visit (HOSPITAL_BASED_OUTPATIENT_CLINIC_OR_DEPARTMENT_OTHER): Payer: Medicare Other

## 2014-07-19 ENCOUNTER — Ambulatory Visit (HOSPITAL_BASED_OUTPATIENT_CLINIC_OR_DEPARTMENT_OTHER): Payer: Medicare Other | Admitting: Nurse Practitioner

## 2014-07-19 ENCOUNTER — Telehealth: Payer: Self-pay

## 2014-07-19 ENCOUNTER — Telehealth: Payer: Self-pay | Admitting: Nurse Practitioner

## 2014-07-19 VITALS — BP 159/104 | HR 86 | Temp 97.4°F | Resp 18 | Ht 66.0 in | Wt 161.2 lb

## 2014-07-19 DIAGNOSIS — Z85038 Personal history of other malignant neoplasm of large intestine: Secondary | ICD-10-CM

## 2014-07-19 DIAGNOSIS — C189 Malignant neoplasm of colon, unspecified: Secondary | ICD-10-CM

## 2014-07-19 LAB — CEA

## 2014-07-19 NOTE — Telephone Encounter (Signed)
Left message to confirm appointmnet for August. Mailed calendar.

## 2014-07-19 NOTE — Progress Notes (Signed)
  [place] OFFICE PROGRESS NOTE   Diagnosis:  Colon cancer  INTERVAL HISTORY:   Debra Huber returns as scheduled. She feels well. No change in bowel habits. No bloody or black stools. No abdominal pain. No shortness of breath. She has a good appetite. She is very active.  Objective:  Vital signs in last 24 hours:  Blood pressure 159/104, pulse 86, temperature 97.4 F (36.3 C), temperature source Oral, resp. rate 18, height 5\' 6"  (1.676 m), weight 161 lb 3.2 oz (73.12 kg), SpO2 98 %.    HEENT: No thrush or ulcers. Lymphatics: No palpable cervical, supra clavicular, axillary or inguinal lymph nodes. Resp: Lungs clear bilaterally. Cardio: Regular rate and rhythm. GI: Abdomen soft and nontender. No hepatomegaly. No mass. Vascular: No leg edema.   Lab Results:  Lab Results  Component Value Date   WBC 4.3 01/28/2014   HGB 14.3 01/28/2014   HCT 42.3 01/28/2014   MCV 82.1 01/28/2014   PLT 290 01/28/2014   NEUTROABS 2.8 01/28/2014    Imaging:  No results found.  Medications: I have reviewed the patient's current medications.  Assessment/Plan: 1. Stage III (T3 N1), grade 2, adenocarcinoma of the ascending colon, status post a right hemicolectomy 11/17/2009, status post adjuvant FOLFOX for 12 cycles completed January 2012   Colonoscopy December 2014, status post removal of tubular adenomas from the descending colon and rectum  2. 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 Loanne Drilling  3. Benign right breast biopsy 01/25/2013-hamartomatous lesion with calcifications  4. Small bilateral pleural effusions on a chest CT 05/04/2013, no effusion on the CT 10/05/13 5. Hypertension    Disposition: Debra Huber appears stable. She remains in clinical remission from colon cancer. We will follow-up on the CEA from today. She will return for a follow-up  visit and CEA in 6 months.    Ned Card ANP/GNP-BC   07/19/2014  3:37 PM

## 2014-07-19 NOTE — Telephone Encounter (Signed)
Called for pt recent colonoscopy report. Spoke with Marcene Brawn who will fax them over. Fax was received, copt to Ned Card, NP and copy to medical records to scan.

## 2014-07-22 ENCOUNTER — Telehealth: Payer: Self-pay

## 2014-07-22 NOTE — Telephone Encounter (Signed)
Called and informed pt CEA is normal. Pt verbalized understanding and denies any questions or concerns at this time.

## 2014-07-22 NOTE — Telephone Encounter (Signed)
-----   Message from Ladell Pier, MD sent at 07/20/2014  8:10 AM EST ----- Please call patient, cea is normal

## 2014-08-06 ENCOUNTER — Other Ambulatory Visit: Payer: Self-pay | Admitting: Emergency Medicine

## 2014-08-06 ENCOUNTER — Encounter: Payer: Self-pay | Admitting: Emergency Medicine

## 2014-08-06 ENCOUNTER — Ambulatory Visit (INDEPENDENT_AMBULATORY_CARE_PROVIDER_SITE_OTHER): Payer: Medicare Other | Admitting: Emergency Medicine

## 2014-08-06 VITALS — BP 180/110 | HR 64 | Temp 97.9°F | Resp 16 | Ht 67.0 in | Wt 159.6 lb

## 2014-08-06 DIAGNOSIS — C73 Malignant neoplasm of thyroid gland: Secondary | ICD-10-CM | POA: Diagnosis not present

## 2014-08-06 DIAGNOSIS — C189 Malignant neoplasm of colon, unspecified: Secondary | ICD-10-CM

## 2014-08-06 DIAGNOSIS — Z124 Encounter for screening for malignant neoplasm of cervix: Secondary | ICD-10-CM | POA: Diagnosis not present

## 2014-08-06 DIAGNOSIS — E079 Disorder of thyroid, unspecified: Secondary | ICD-10-CM

## 2014-08-06 DIAGNOSIS — I10 Essential (primary) hypertension: Secondary | ICD-10-CM | POA: Diagnosis not present

## 2014-08-06 DIAGNOSIS — E890 Postprocedural hypothyroidism: Secondary | ICD-10-CM | POA: Diagnosis not present

## 2014-08-06 LAB — CBC WITH DIFFERENTIAL/PLATELET
BASOS PCT: 1 % (ref 0–1)
Basophils Absolute: 0 10*3/uL (ref 0.0–0.1)
EOS ABS: 0.1 10*3/uL (ref 0.0–0.7)
Eosinophils Relative: 2 % (ref 0–5)
HCT: 40 % (ref 36.0–46.0)
Hemoglobin: 13.6 g/dL (ref 12.0–15.0)
Lymphocytes Relative: 31 % (ref 12–46)
Lymphs Abs: 1.5 10*3/uL (ref 0.7–4.0)
MCH: 28.3 pg (ref 26.0–34.0)
MCHC: 34 g/dL (ref 30.0–36.0)
MCV: 83.2 fL (ref 78.0–100.0)
MPV: 10.9 fL (ref 8.6–12.4)
Monocytes Absolute: 0.3 10*3/uL (ref 0.1–1.0)
Monocytes Relative: 6 % (ref 3–12)
Neutro Abs: 2.8 10*3/uL (ref 1.7–7.7)
Neutrophils Relative %: 60 % (ref 43–77)
PLATELETS: 344 10*3/uL (ref 150–400)
RBC: 4.81 MIL/uL (ref 3.87–5.11)
RDW: 15.8 % — ABNORMAL HIGH (ref 11.5–15.5)
WBC: 4.7 10*3/uL (ref 4.0–10.5)

## 2014-08-06 LAB — COMPLETE METABOLIC PANEL WITH GFR
ALT: 15 U/L (ref 0–35)
AST: 27 U/L (ref 0–37)
Albumin: 4.4 g/dL (ref 3.5–5.2)
Alkaline Phosphatase: 71 U/L (ref 39–117)
BUN: 11 mg/dL (ref 6–23)
CALCIUM: 8.8 mg/dL (ref 8.4–10.5)
CHLORIDE: 100 meq/L (ref 96–112)
CO2: 30 meq/L (ref 19–32)
Creat: 0.6 mg/dL (ref 0.50–1.10)
GFR, Est Non African American: >89 mL/min
GLUCOSE: 80 mg/dL (ref 70–99)
Potassium: 3.9 mEq/L (ref 3.5–5.3)
Sodium: 139 mEq/L (ref 135–145)
Total Bilirubin: 0.4 mg/dL (ref 0.2–1.2)
Total Protein: 7.4 g/dL (ref 6.0–8.3)

## 2014-08-06 LAB — LIPID PANEL
CHOLESTEROL: 213 mg/dL — AB (ref 0–200)
HDL: 53 mg/dL (ref >=46)
LDL Cholesterol: 132 mg/dL — ABNORMAL HIGH (ref 0–99)
TRIGLYCERIDES: 140 mg/dL (ref <150)
Total CHOL/HDL Ratio: 4 Ratio
VLDL: 28 mg/dL (ref 0–40)

## 2014-08-06 LAB — POCT URINALYSIS DIPSTICK
Bilirubin, UA: NEGATIVE
Glucose, UA: NEGATIVE
Ketones, UA: NEGATIVE
Leukocytes, UA: NEGATIVE
NITRITE UA: NEGATIVE
PH UA: 7.5
PROTEIN UA: NEGATIVE
RBC UA: NEGATIVE
Spec Grav, UA: 1.02
Urobilinogen, UA: 0.2

## 2014-08-06 LAB — TSH: TSH: 6.42 u[IU]/mL — AB (ref 0.350–4.500)

## 2014-08-06 MED ORDER — LEVOTHYROXINE SODIUM 125 MCG PO TABS: 125.0000 ug | ORAL_TABLET | Freq: Every day | ORAL | Status: DC

## 2014-08-06 MED ORDER — CLONIDINE HCL 0.2 MG PO TABS: 0.2000 mg | ORAL_TABLET | Freq: Two times a day (BID) | ORAL | Status: DC

## 2014-08-06 MED ORDER — LISINOPRIL 40 MG PO TABS: 40.0000 mg | ORAL_TABLET | Freq: Every day | ORAL | Status: DC

## 2014-08-06 MED ORDER — HYDROCHLOROTHIAZIDE 25 MG PO TABS: 25.0000 mg | ORAL_TABLET | Freq: Every day | ORAL | Status: DC

## 2014-08-06 NOTE — Progress Notes (Addendum)
Subjective:  This chart was scribed for Arlyss Queen, MD by Donato Schultz, Medical Scribe. This patient was seen in Room 22 and the patient's care was started at 1:57 PM.   Patient ID: Debra Huber, female    DOB: 03-06-1951, 64 y.o.   MRN: [id number]  HPI HPI Comments: Debra Huber is a 64 y.o. female who presents to the [facility] for her annual exam.  She states that she is feeling and eating well.  She is followed at the [facility] for her colon cancer and Dr. Loanne Drilling for her thyroid cancer.   She is getting a mammogram in March and her next appointment with her oncologist is in 6 months.  She is due for another colonoscopy next year.  She is going to see a diabetic specialist for her eye exam and removal of cataracts.  Her last pap was done in 2014.  She takes her blood pressure at home and her readings are between 120-124/70-80.  She denies dizziness, SOB, and chest pain as associated symptoms.  She has a family history of hypertension and was diagnosed with hypertension over 5 years ago.  She also has a history of thyroid cancer.  She has a fraternal twin.     Past Medical History  Diagnosis Date  . Anemia   . Hypertension   . Thyroid disease   . Hyperthyroidism     GRAVES DISEASE  . Colon cancer dx'd 11/2009    S/P HEMI-COLECTOMY AND FINISHED CHEMO 11/24/10 -DR. ODOGWU  . Complication of anesthesia     ALWAYS FEELS VERY SLEEPY WAKING UP  . Blood transfusion without reported diagnosis    Past Surgical History  Procedure Laterality Date  . Colectomy      partial  . Port-a-cath removal  11/04/11  . Needle bx thyroid    . Thyroidectomy  02/25/2012    Procedure: THYROIDECTOMY;  Surgeon: Pedro Earls, MD;  Location: [facility];  Service: General;;  . Hematoma evacuation  02/26/2012    Procedure: EVACUATION HEMATOMA;  Surgeon: Pedro Earls, MD;  Location: [facility];  Service: General;  Laterality: N/A;  irrigation and drainage of hematoma and placement of  drain.   Family History  Problem Relation Age of Onset  . Cancer Father     prostat  . Hypertension Sister   . Thyroid disease Sister   . Hypertension Brother   . Hypertension Sister   . Thyroid disease Sister   . Hypertension Sister   . Hypertension Sister   . Hypertension Sister   . Hypertension Sister   . Hypertension Brother    History   Social History  . Marital Status: Single    Spouse Name: N/A  . Number of Children: N/A  . Years of Education: N/A   Occupational History  . Not on file.   Social History Main Topics  . Smoking status: Never Smoker   . Smokeless tobacco: Never Used     Comment: Pt states she has been exposed to lots of secondhand smoke.    Marland Kitchen Alcohol Use: No  . Drug Use: No  . Sexual Activity: Yes   Other Topics Concern  . Not on file   Social History Narrative  . No narrative on file   Allergies  Allergen Reactions  . Penicillins Swelling    Review of Systems  Constitutional: Negative for appetite change.  Respiratory: Negative for shortness of breath.   Cardiovascular: Negative for chest pain.  Neurological: Negative for dizziness.  Psychiatric/Behavioral: Negative for sleep disturbance.  All other systems reviewed and are negative.   Objective:  Physical Exam  Constitutional: She is oriented to person, place, and time. She appears well-developed and well-nourished.  HENT:  Head: Normocephalic and atraumatic.  Right Ear: Hearing, tympanic membrane, external ear and ear canal normal.  Left Ear: Hearing, tympanic membrane, external ear and ear canal normal.  Nose: Nose normal.  Mouth/Throat: Oropharynx is clear and moist. No oropharyngeal exudate.  Eyes: Conjunctivae and EOM are normal. Pupils are equal, round, and reactive to light.  Neck: Normal range of motion. Neck supple.  Surgically absent thyroid.  Cardiovascular: Normal rate, regular rhythm and normal heart sounds.  Exam reveals no gallop and no friction rub.   No murmur  heard. Pulmonary/Chest: Effort normal and breath sounds normal. No respiratory distress. She has no wheezes. She has no rales.  Abdominal: Soft. Bowel sounds are normal. There is no tenderness.  Musculoskeletal: Normal range of motion.  Lymphadenopathy:    She has no cervical adenopathy.  Neurological: She is alert and oriented to person, place, and time.  Skin: Skin is warm and dry.  Psychiatric: She has a normal mood and affect. Her behavior is normal.  Nursing note and vitals reviewed.  Meds ordered this encounter  Medications  . lisinopril (PRINIVIL,ZESTRIL) 40 MG tablet    Sig: Take 1 tablet (40 mg total) by mouth daily.    Dispense:  90 tablet    Refill:  1  . levothyroxine (SYNTHROID, LEVOTHROID) 125 MCG tablet    Sig: Take 1 tablet (125 mcg total) by mouth daily.    Dispense:  90 tablet    Refill:  1  . hydrochlorothiazide (HYDRODIURIL) 25 MG tablet    Sig: Take 1 tablet (25 mg total) by mouth daily with breakfast.    Dispense:  30 tablet    Refill:  11  . cloNIDine (CATAPRES) 0.2 MG tablet    Sig: Take 1 tablet (0.2 mg total) by mouth 2 (two) times daily.    Dispense:  60 tablet    Refill:  11     BP 193/128 mmHg  Pulse 62  Temp(Src) 97.9 F (36.6 C) (Oral)  Resp 16  Ht 5\' 7"  (1.702 m)  Wt 159 lb 9.6 oz (72.394 kg)  BMI 24.99 kg/m2  SpO2 98% Assessment & Plan:  I took her blood pressure multiple times. The average blood pressure was left arm 180/116 right arm 170/110. She states her pressures are always good at home. I did not change her medications. She is going to take her blood pressure at different times of the day over the next 2 weeks at home record them and bring them with her for her follow-up exam. She states she is feeling the best she has ever felt at the present time. She is currently on Catapres 0.2 twice a day I did die or L 25 a day and Zestril 40 mg a day. She had issues with bradycardia and lightheadedness and so her beta blocker had to be stopped.  Another blood pressure was checked and was 160/105. She is agreeable to bring by her blood pressure readings and I will make a decision about her blood pressure medications. Her EKG interestingly does not show any signs of LVH. She had a cardiac evaluation last summer. The blood pressure 180/110 was taken while she was here but the recorded time is 520 when I entered it into the system.I personally performed  the services described in this documentation, which was scribed in my presence. The recorded information has been reviewed and is accurate.

## 2014-08-06 NOTE — Progress Notes (Deleted)
   Subjective:    Patient ID: Debra Huber, female    DOB: 04-23-1951, 64 y.o.   MRN: [id number]  HPI    Review of Systems     Objective:   Physical Exam        Assessment & Plan:

## 2014-08-06 NOTE — Progress Notes (Deleted)
   Subjective:    Patient ID: Debra Huber, female    DOB: 1951/05/12, 64 y.o.   MRN: [id number]  HPI    Review of Systems     Objective:   Physical Exam        Assessment & Plan:

## 2014-08-07 ENCOUNTER — Encounter: Payer: Self-pay | Admitting: Family Medicine

## 2014-08-07 LAB — T4, FREE: Free T4: 1.34 ng/dL (ref 0.80–1.80)

## 2014-08-07 LAB — PAP IG (IMAGE GUIDED)

## 2014-08-09 LAB — CATECHOLAMINES, FRACTIONATED, PLASMA
Catecholamines, Total: 718 pg/mL
EPINEPHRINE: 24 pg/mL
NOREPINEPHRINE: 694 pg/mL

## 2014-08-10 LAB — METANEPHRINES, PLASMA
Metanephrine, Free: <25 pg/mL (ref <=57)
NORMETANEPHRINE FREE: 75 pg/mL (ref <=148)
TOTAL METANEPHRINES-PLASMA: 75 pg/mL (ref <=205)

## 2014-08-16 ENCOUNTER — Telehealth: Payer: Self-pay

## 2014-08-16 NOTE — Telephone Encounter (Signed)
Pt called to let Dr. Everlene Farrier know that her blood pressure reading was 119 over 82 today.

## 2014-08-16 NOTE — Telephone Encounter (Signed)
   Expand All Collapse All    Pt called to let Dr. Everlene Farrier know that her blood pressure reading was 119 over 82 today.

## 2014-08-29 ENCOUNTER — Telehealth: Payer: Self-pay

## 2014-08-29 NOTE — Telephone Encounter (Signed)
Patient is calling to let Dr. Everlene Farrier know that she has been taking amlodipine and her blood pressure has been good

## 2014-08-30 NOTE — Telephone Encounter (Signed)
This is great news please continue the medication.

## 2014-10-02 DIAGNOSIS — H2511 Age-related nuclear cataract, right eye: Secondary | ICD-10-CM | POA: Diagnosis not present

## 2014-10-02 DIAGNOSIS — H25011 Cortical age-related cataract, right eye: Secondary | ICD-10-CM | POA: Diagnosis not present

## 2014-10-02 DIAGNOSIS — H3531 Nonexudative age-related macular degeneration: Secondary | ICD-10-CM | POA: Diagnosis not present

## 2014-10-02 DIAGNOSIS — H40023 Open angle with borderline findings, high risk, bilateral: Secondary | ICD-10-CM | POA: Diagnosis not present

## 2014-10-09 ENCOUNTER — Telehealth: Payer: Self-pay | Admitting: *Deleted

## 2014-10-09 ENCOUNTER — Ambulatory Visit (HOSPITAL_BASED_OUTPATIENT_CLINIC_OR_DEPARTMENT_OTHER): Payer: Medicare Other | Admitting: Nurse Practitioner

## 2014-10-09 ENCOUNTER — Other Ambulatory Visit (HOSPITAL_BASED_OUTPATIENT_CLINIC_OR_DEPARTMENT_OTHER): Payer: Medicare Other

## 2014-10-09 ENCOUNTER — Telehealth: Payer: Self-pay | Admitting: Radiology

## 2014-10-09 ENCOUNTER — Other Ambulatory Visit: Payer: Self-pay | Admitting: *Deleted

## 2014-10-09 ENCOUNTER — Ambulatory Visit (HOSPITAL_COMMUNITY)
Admission: RE | Admit: 2014-10-09 | Discharge: 2014-10-09 | Disposition: A | Discharge status: Home / Self Care | Payer: Medicare Other | Source: Ambulatory Visit | Attending: Nurse Practitioner | Admitting: Nurse Practitioner

## 2014-10-09 ENCOUNTER — Encounter: Payer: Self-pay | Admitting: Nurse Practitioner

## 2014-10-09 VITALS — BP 191/123 | HR 50 | Temp 98.0°F | Resp 18 | Wt 165.6 lb

## 2014-10-09 DIAGNOSIS — R140 Abdominal distension (gaseous): Secondary | ICD-10-CM | POA: Diagnosis not present

## 2014-10-09 DIAGNOSIS — C189 Malignant neoplasm of colon, unspecified: Secondary | ICD-10-CM

## 2014-10-09 DIAGNOSIS — Z85038 Personal history of other malignant neoplasm of large intestine: Secondary | ICD-10-CM | POA: Diagnosis not present

## 2014-10-09 DIAGNOSIS — R1110 Vomiting, unspecified: Secondary | ICD-10-CM | POA: Diagnosis not present

## 2014-10-09 DIAGNOSIS — R112 Nausea with vomiting, unspecified: Secondary | ICD-10-CM | POA: Insufficient documentation

## 2014-10-09 DIAGNOSIS — K921 Melena: Secondary | ICD-10-CM

## 2014-10-09 DIAGNOSIS — I10 Essential (primary) hypertension: Secondary | ICD-10-CM | POA: Diagnosis not present

## 2014-10-09 DIAGNOSIS — C73 Malignant neoplasm of thyroid gland: Secondary | ICD-10-CM

## 2014-10-09 DIAGNOSIS — R1010 Upper abdominal pain, unspecified: Secondary | ICD-10-CM | POA: Insufficient documentation

## 2014-10-09 DIAGNOSIS — R109 Unspecified abdominal pain: Secondary | ICD-10-CM | POA: Insufficient documentation

## 2014-10-09 LAB — COMPREHENSIVE METABOLIC PANEL (CC13)
ALK PHOS: 61 U/L (ref 40–150)
ALT: 13 U/L (ref 0–55)
AST: 26 U/L (ref 5–34)
Albumin: 3.7 g/dL (ref 3.5–5.0)
Anion Gap: 9 mEq/L (ref 3–11)
BUN: 9.1 mg/dL (ref 7.0–26.0)
CALCIUM: 8.2 mg/dL — AB (ref 8.4–10.4)
CHLORIDE: 104 meq/L (ref 98–109)
CO2: 26 mEq/L (ref 22–29)
CREATININE: 0.7 mg/dL (ref 0.6–1.1)
Glucose: 89 mg/dl (ref 70–140)
Potassium: 4.3 mEq/L (ref 3.5–5.1)
Sodium: 139 mEq/L (ref 136–145)
Total Bilirubin: 0.34 mg/dL (ref 0.20–1.20)
Total Protein: 6.7 g/dL (ref 6.4–8.3)

## 2014-10-09 LAB — CBC WITH DIFFERENTIAL/PLATELET
BASO%: 1 % (ref 0.0–2.0)
Basophils Absolute: 0 10*3/uL (ref 0.0–0.1)
EOS%: 1.7 % (ref 0.0–7.0)
Eosinophils Absolute: 0.1 10*3/uL (ref 0.0–0.5)
HCT: 38.2 % (ref 34.8–46.6)
HGB: 12.4 g/dL (ref 11.6–15.9)
LYMPH#: 1.3 10*3/uL (ref 0.9–3.3)
LYMPH%: 28.2 % (ref 14.0–49.7)
MCH: 27.4 pg (ref 25.1–34.0)
MCHC: 32.4 g/dL (ref 31.5–36.0)
MCV: 84.6 fL (ref 79.5–101.0)
MONO#: 0.4 10*3/uL (ref 0.1–0.9)
MONO%: 8.3 % (ref 0.0–14.0)
NEUT#: 2.9 10*3/uL (ref 1.5–6.5)
NEUT%: 60.8 % (ref 38.4–76.8)
Platelets: 277 10*3/uL (ref 145–400)
RBC: 4.52 10*6/uL (ref 3.70–5.45)
RDW: 14.8 % — AB (ref 11.2–14.5)
WBC: 4.8 10*3/uL (ref 3.9–10.3)

## 2014-10-09 NOTE — Telephone Encounter (Signed)
Jethro Bolus at [facility] wanted you to know that pt was in today and her BP was 191/123. On recheck, it was 185/103. Pt says this is typical of her when she's at the dr. She just wanted you to know.

## 2014-10-09 NOTE — Telephone Encounter (Signed)
TC from patient-she is returning call to Jethro Bolus, LPN. Transferred call.

## 2014-10-09 NOTE — Assessment & Plan Note (Signed)
Patient is complaining of some vague, mild upper abdominal discomfort since this past weekend.  She's also complaining of some nausea and one episode of vomiting.  She has also had some increased reflux symptoms over the weekend as well.  On exam-no abdominal tenderness with palpation whatsoever.  Abdomen remains soft; with no rebound tenderness.  Bowel sounds positive in all 4 quads.  Rectal exam within normal limits; with the exception of one small external hemorrhoid only.  No obvious rectal bleeding noted.  Attempted to obtain stool sample; but there was no stool in the rectal vault.  KUB plain film obtained today was negative for obstruction or any other acute findings.  Advised patient to follow back up with her gastroenterologist for any persistent or worsening abdominal discomfort or discolored stools.

## 2014-10-09 NOTE — Progress Notes (Signed)
SYMPTOM MANAGEMENT CLINIC   HPI: Debra Huber 64 y.o. female diagnosed with colon cancer and thyroid cancer.  Patient is status post right chemotherapy colectomy in 2011.  She completed chemotherapy in January 2012.  Her last colonoscopy obtained in December 2014 was normal.  CEA at time of last cancer Center exam in February 2016 was within normal limits.  Patient is now complaining of some vague upper abdominal discomfort, nausea with one episode of vomiting, some mild reflux symptoms, and some changes in her stool color over this past weekend.  She denies any fever or chills.  HPI  ROS  Past Medical History  Diagnosis Date  . Anemia   . Hypertension   . Thyroid disease   . Hyperthyroidism     GRAVES DISEASE  . Colon cancer dx'd 11/2009    S/P HEMI-COLECTOMY AND FINISHED CHEMO 11/24/10 -DR. ODOGWU  . Complication of anesthesia     ALWAYS FEELS VERY SLEEPY WAKING UP  . Blood transfusion without reported diagnosis     Past Surgical History  Procedure Laterality Date  . Colectomy      partial  . Port-a-cath removal  11/04/11  . Needle bx thyroid    . Thyroidectomy  02/25/2012    Procedure: THYROIDECTOMY;  Surgeon: Pedro Earls, MD;  Location: [facility];  Service: General;;  . Hematoma evacuation  02/26/2012    Procedure: EVACUATION HEMATOMA;  Surgeon: Pedro Earls, MD;  Location: [facility];  Service: General;  Laterality: N/A;  irrigation and drainage of hematoma and placement of drain.    has Malignant neoplasm of colon; Hypertension; Macular degeneration; Postsurgical hypothyroidism; Lung nodule seen on imaging study; Malignant neoplasm of thyroid gland; Bradycardia; Nausea with vomiting; and Abdominal pain on her problem list.    is allergic to penicillins.    Medication List       This list is accurate as of: 10/09/14  4:49 PM.  Always use your most recent med list.               amitriptyline 25 MG tablet  Commonly known as:  ELAVIL  Take 1 tablet (25 mg  total) by mouth at bedtime.     CENTRUM SILVER Chew  Chew 1 tablet by mouth daily.     cloNIDine 0.2 MG tablet  Commonly known as:  CATAPRES  Take 1 tablet (0.2 mg total) by mouth 2 (two) times daily.     CO-Q 10 OMEGA-3 FISH OIL PO  Take by mouth.     hydrochlorothiazide 25 MG tablet  Commonly known as:  HYDRODIURIL  Take 1 tablet (25 mg total) by mouth daily with breakfast.     levothyroxine 125 MCG tablet  Commonly known as:  SYNTHROID, LEVOTHROID  Take 1 tablet (125 mcg total) by mouth daily.     lisinopril 40 MG tablet  Commonly known as:  PRINIVIL,ZESTRIL  Take 1 tablet (40 mg total) by mouth daily.     OVER THE COUNTER MEDICATION  "Vitamins a-e"     vitamin E 1000 UNIT capsule  Generic drug:  vitamin E  Take 1,000 Units by mouth daily.         PHYSICAL EXAMINATION  Oncology Vitals 10/09/2014 08/06/2014 08/06/2014 07/19/2014 02/27/2014 01/28/2014 01/28/2014  Height - - 170 cm 168 cm - - -  Weight 75.116 kg - 72.394 kg 73.12 kg - - -  Weight (lbs) 165 lbs 10 oz - 159 lbs 10 oz 161 lbs 3 oz - - -  BMI (kg/m2) - - 25 kg/m2 26.02 kg/m2 - - -  Temp 98 - 97.9 97.4 - - -  Pulse 50 64 62 86 58 45 46  Resp 18 - 16 18 - - -  Resp (Historical as of 01/13/12) - - - - - - -  SpO2 100 - 98 98 - - -  BSA (m2) - - 1.85 m2 1.84 m2 - - -   BP Readings from Last 3 Encounters:  10/09/14 191/123  08/06/14 180/110  07/19/14 159/104    Physical Exam  Constitutional: She is oriented to person, place, and time and well-developed, well-nourished, and in no distress.  HENT:  Head: Normocephalic and atraumatic.  Mouth/Throat: Oropharynx is clear and moist.  Eyes: Conjunctivae and EOM are normal. Pupils are equal, round, and reactive to light. Right eye exhibits no discharge. Left eye exhibits no discharge. No scleral icterus.  Neck: Normal range of motion. Neck supple. No JVD present. No tracheal deviation present. No thyromegaly present.  Cardiovascular: Normal rate, regular rhythm,  normal heart sounds and intact distal pulses.   Pulmonary/Chest: Effort normal and breath sounds normal. No respiratory distress. She has no wheezes. She has no rales. She exhibits no tenderness.  Abdominal: Soft. Bowel sounds are normal. She exhibits no distension and no mass. There is no tenderness. There is no rebound and no guarding.  Genitourinary: Guaiac negative stool.  1 tiny external hemorrhoid noted on rectal exam.  Musculoskeletal: Normal range of motion. She exhibits no edema or tenderness.  Lymphadenopathy:    She has no cervical adenopathy.  Neurological: She is alert and oriented to person, place, and time. Gait normal.  Skin: Skin is warm and dry. No rash noted. No erythema. No pallor.  Psychiatric: Affect normal.  Nursing note and vitals reviewed.   LABORATORY DATA:. Appointment on 10/09/2014  Component Date Value Ref Range Status  . WBC 10/09/2014 4.8  3.9 - 10.3 10e3/uL Final  . NEUT# 10/09/2014 2.9  1.5 - 6.5 10e3/uL Final  . HGB 10/09/2014 12.4  11.6 - 15.9 g/dL Final  . HCT 10/09/2014 38.2  34.8 - 46.6 % Final  . Platelets 10/09/2014 277  145 - 400 10e3/uL Final  . MCV 10/09/2014 84.6  79.5 - 101.0 fL Final  . MCH 10/09/2014 27.4  25.1 - 34.0 pg Final  . MCHC 10/09/2014 32.4  31.5 - 36.0 g/dL Final  . RBC 10/09/2014 4.52  3.70 - 5.45 10e6/uL Final  . RDW 10/09/2014 14.8* 11.2 - 14.5 % Final  . lymph# 10/09/2014 1.3  0.9 - 3.3 10e3/uL Final  . MONO# 10/09/2014 0.4  0.1 - 0.9 10e3/uL Final  . Eosinophils Absolute 10/09/2014 0.1  0.0 - 0.5 10e3/uL Final  . Basophils Absolute 10/09/2014 0.0  0.0 - 0.1 10e3/uL Final  . NEUT% 10/09/2014 60.8  38.4 - 76.8 % Final  . LYMPH% 10/09/2014 28.2  14.0 - 49.7 % Final  . MONO% 10/09/2014 8.3  0.0 - 14.0 % Final  . EOS% 10/09/2014 1.7  0.0 - 7.0 % Final  . BASO% 10/09/2014 1.0  0.0 - 2.0 % Final  . Sodium 10/09/2014 139  136 - 145 mEq/L Final  . Potassium 10/09/2014 4.3  3.5 - 5.1 mEq/L Final  . Chloride 10/09/2014 104  98  - 109 mEq/L Final  . CO2 10/09/2014 26  22 - 29 mEq/L Final  . Glucose 10/09/2014 89  70 - 140 mg/dl Final  . BUN 10/09/2014 9.1  7.0 - 26.0 mg/dL Final  .  Creatinine 10/09/2014 0.7  0.6 - 1.1 mg/dL Final  . Total Bilirubin 10/09/2014 0.34  0.20 - 1.20 mg/dL Final  . Alkaline Phosphatase 10/09/2014 61  40 - 150 U/L Final  . AST 10/09/2014 26  5 - 34 U/L Final  . ALT 10/09/2014 13  0 - 55 U/L Final  . Total Protein 10/09/2014 6.7  6.4 - 8.3 g/dL Final  . Albumin 10/09/2014 3.7  3.5 - 5.0 g/dL Final  . Calcium 10/09/2014 8.2* 8.4 - 10.4 mg/dL Final  . Anion Gap 10/09/2014 9  3 - 11 mEq/L Final  . EGFR 10/09/2014 >90  >90 ml/min/1.73 m2 Final   eGFR is calculated using the CKD-EPI Creatinine Equation (2009)     RADIOGRAPHIC STUDIES: Dg Abd 1 View  10/09/2014   CLINICAL DATA:  Upper abdominal pain with black stools, vomiting, reflux and bloating. History of colon cancer in 2012. Initial encounter.  EXAM: ABDOMEN - 1 VIEW  COMPARISON:  CT 05/09/2012.  FINDINGS: The visualized bowel gas pattern is normal. The upper abdomen is incompletely visualized. There are possible anastomosis clips in the right upper quadrant. Bilateral tubal ligation clips and pelvic phleboliths noted. There is mild lumbar spondylosis.  IMPRESSION: No acute abdominal findings.  Nonobstructive bowel gas pattern.   Electronically Signed   By: Richardean Sale M.D.   On: 10/09/2014 14:50    ASSESSMENT/PLAN:    Malignant neoplasm of colon Patient is status post right chemotherapy colectomy in 2011.  She completed chemotherapy in January 2012.  Her last colonoscopy obtained in December 2014 was normal.  CEA at time of last cancer Center exam in February 2016 was within normal limits.  Patient is now complaining of some vague upper abdominal discomfort, nausea with one episode of vomiting, some mild reflux symptoms, and some changes in her stool color over this past weekend.  She denies any fever or chills.  On exam-abdomen  soft, nontender, with no rebound tenderness.  Bowel sounds positive in all 4 quads.  No flank pain noted.  Rectal exam within normal limits; with the exception of a tiny external hemorrhoid that is nontender.  There was no obvious rectal bleeding on exam.  Also, attempted to obtain a stool sample for Hemoccult test; but there was no stool in the rectal vault.  Hemoglobin remained stable.  Platelet count within normal limits.  Abdominal plain film obtained this afternoon revealed no obstruction or other abnormalities.  Advised patient to follow-up with her gastroenterologist is abdominal discomfort and black stools continue.  Patient is scheduled to return on 01/16/2015 for her next labs in follow-up visit.   Hypertension Patient has a history of hypertension.  She takes clonidine, HCTZ, and lisinopril as directed.  She reports not missing any doses of her blood pressure medications recently.  Blood pressure on initial check here at the [facility] today was 191/123.  Patient states that she has "white coat syndrome" and becomes very anxious when she has to come to the doctor's appointment.  She states that this same elevated blood pressure occurred when she was at her primary care physician's office last month; but her blood pressure always returns to her baseline when she returns back home.  Patient denies any headache, vision problems, or other new symptoms.  Advised patient to recheck her blood pressure when she gets back; and to let us know if remains elevated.  Also, will follow up with Dr. Everlene Farrier (PCP) regarding patient's blood pressure while at the [facility] today.  Malignant neoplasm of thyroid gland Patient underwent a total thyroidectomy in September 2013; is followed by Dr. Loanne Drilling.   Nausea with vomiting Patient has been complaining of nausea over this past weekend; and vomited one time on Sunday.  Patient denies any nausea at current time.  Patient refused any  anti-emetic prescription today.  She states that she can drink seltzer water; and that always helps with her nausea.   Abdominal pain Patient is complaining of some vague, mild upper abdominal discomfort since this past weekend.  She's also complaining of some nausea and one episode of vomiting.  She has also had some increased reflux symptoms over the weekend as well.  On exam-no abdominal tenderness with palpation whatsoever.  Abdomen remains soft; with no rebound tenderness.  Bowel sounds positive in all 4 quads.  Rectal exam within normal limits; with the exception of one small external hemorrhoid only.  No obvious rectal bleeding noted.  Attempted to obtain stool sample; but there was no stool in the rectal vault.  KUB plain film obtained today was negative for obstruction or any other acute findings.  Advised patient to follow back up with her gastroenterologist for any persistent or worsening abdominal discomfort or discolored stools.      Patient stated understanding of all instructions; and was in agreement with this plan of care. The patient knows to call the clinic with any problems, questions or concerns.   Review/collaboration with Dr. Benay Spice regarding all aspects of patient's visit today.   Total time spent with patient was 40 minutes;  with greater than 75 percent of that time spent in face to face counseling regarding patient's symptoms,  and coordination of care and follow up.  Disclaimer: This note was dictated with voice recognition software. Similar sounding words can inadvertently be transcribed and may not be corrected upon review.   Drue Second, NP 10/09/2014

## 2014-10-09 NOTE — Assessment & Plan Note (Signed)
Patient has been complaining of nausea over this past weekend; and vomited one time on Sunday.  Patient denies any nausea at current time.  Patient refused any anti-emetic prescription today.  She states that she can drink seltzer water; and that always helps with her nausea.

## 2014-10-09 NOTE — Progress Notes (Signed)
BP on recheck was 185/103. Note sent to PCP Dr. Everlene Farrier for close follow up of BP.    Pt has already been instructed to recheck BP once she gets back home.   She was instructed to call/return or go directly to ED for any persistent or worsening symptoms whatsoever.

## 2014-10-09 NOTE — Assessment & Plan Note (Signed)
Patient underwent a total thyroidectomy in September 2013; is followed by Dr. Loanne Drilling.

## 2014-10-09 NOTE — Telephone Encounter (Signed)
LM for pt to rtn call- KUB was negative for any obstruction or abnormality. She needs to follow up with her GI dr early next week- proceed to ED if symptoms worsen or return.   Need to have pt recheck BP while on the call with her- BP in OV was 191/123- Per pt this is typical of her during doctor appts would like to recheck while pt is at home.   Note forwarded to PCP- Dr. Everlene Farrier to advise BP reading.

## 2014-10-09 NOTE — Telephone Encounter (Signed)
Pt rtn call- she is in the store at the moment and will call back with her BP reading.  Pt advised of xray results. She understands ED directions and states she will follow up with her GI next week.

## 2014-10-09 NOTE — Telephone Encounter (Signed)
I am really not sure how compliant she is. When I saw her at [place]  this weekend she did not recognize who I was and she has seen me for the last 3 years. She has intermittently had very high blood pressures but in repeat 1-2 weeks later returns to normal. I will have them call and see if she will take her blood pressure and record it on a daily basis

## 2014-10-09 NOTE — Assessment & Plan Note (Addendum)
Patient is status post right chemotherapy colectomy in 2011.  She completed chemotherapy in January 2012.  Her last colonoscopy obtained in December 2014 was normal.  CEA at time of last cancer Center exam in February 2016 was within normal limits.  Patient is now complaining of some vague upper abdominal discomfort, nausea with one episode of vomiting, some mild reflux symptoms, and some changes in her stool color over this past weekend.  She denies any fever or chills.  On exam-abdomen soft, nontender, with no rebound tenderness.  Bowel sounds positive in all 4 quads.  No flank pain noted.  Rectal exam within normal limits; with the exception of a tiny external hemorrhoid that is nontender.  There was no obvious rectal bleeding on exam.  Also, attempted to obtain a stool sample for Hemoccult test; but there was no stool in the rectal vault.  Hemoglobin remained stable.  Platelet count within normal limits.  Abdominal plain film obtained this afternoon revealed no obstruction or other abnormalities.  Advised patient to follow-up with her gastroenterologist is abdominal discomfort and black stools continue.  Patient is scheduled to return on 01/16/2015 for her next labs in follow-up visit.

## 2014-10-09 NOTE — Assessment & Plan Note (Addendum)
Patient has a history of hypertension.  She takes clonidine, HCTZ, and lisinopril as directed.  She reports not missing any doses of her blood pressure medications recently.  Blood pressure on initial check here at the [facility] today was 191/123.  Patient states that she has "white coat syndrome" and becomes very anxious when she has to come to the doctor's appointment.  She states that this same elevated blood pressure occurred when she was at her primary care physician's office last month; but her blood pressure always returns to her baseline when she returns back home.  Patient denies any headache, vision problems, or other new symptoms.  Advised patient to recheck her blood pressure when she gets back; and to let us know if remains elevated.  Also, will follow up with Dr. Everlene Farrier (PCP) regarding patient's blood pressure while at the [facility] today.

## 2014-10-12 NOTE — Telephone Encounter (Signed)
Late entry: Received call from pt on 10/10/14 stating that her BP at home when rechecked was 101/83.  She also confirmed that she is taking her BP meds as directed.    Advised that she follow up with her PCP for any further BP issues.

## 2014-10-29 DIAGNOSIS — H2511 Age-related nuclear cataract, right eye: Secondary | ICD-10-CM | POA: Diagnosis not present

## 2014-11-21 DIAGNOSIS — H2512 Age-related nuclear cataract, left eye: Secondary | ICD-10-CM | POA: Diagnosis not present

## 2014-11-21 DIAGNOSIS — H25012 Cortical age-related cataract, left eye: Secondary | ICD-10-CM | POA: Diagnosis not present

## 2014-11-26 DIAGNOSIS — H2512 Age-related nuclear cataract, left eye: Secondary | ICD-10-CM | POA: Diagnosis not present

## 2014-12-30 ENCOUNTER — Other Ambulatory Visit: Payer: Self-pay | Admitting: Emergency Medicine

## 2015-01-16 ENCOUNTER — Ambulatory Visit: Payer: Medicare Other | Admitting: Oncology

## 2015-01-16 ENCOUNTER — Other Ambulatory Visit: Payer: Medicare Other

## 2015-02-03 ENCOUNTER — Telehealth: Payer: Self-pay | Admitting: Nurse Practitioner

## 2015-02-03 NOTE — Telephone Encounter (Signed)
Pt called to r/s her f/u labs/ov, confirmed new D/T .Marland Kitchen.. KJ

## 2015-02-04 ENCOUNTER — Ambulatory Visit: Payer: Medicare Other | Admitting: Emergency Medicine

## 2015-02-18 ENCOUNTER — Encounter (INDEPENDENT_AMBULATORY_CARE_PROVIDER_SITE_OTHER): Payer: Medicare Other | Admitting: Ophthalmology

## 2015-02-20 ENCOUNTER — Other Ambulatory Visit: Payer: Self-pay | Admitting: *Deleted

## 2015-02-20 ENCOUNTER — Other Ambulatory Visit: Payer: Medicare Other

## 2015-02-20 ENCOUNTER — Ambulatory Visit: Payer: Medicare Other | Admitting: Nurse Practitioner

## 2015-02-21 ENCOUNTER — Encounter (HOSPITAL_COMMUNITY): Payer: Self-pay

## 2015-02-21 ENCOUNTER — Other Ambulatory Visit (HOSPITAL_BASED_OUTPATIENT_CLINIC_OR_DEPARTMENT_OTHER): Payer: Medicare Other

## 2015-02-21 ENCOUNTER — Telehealth: Payer: Self-pay | Admitting: Oncology

## 2015-02-21 ENCOUNTER — Telehealth: Payer: Self-pay

## 2015-02-21 ENCOUNTER — Ambulatory Visit (HOSPITAL_BASED_OUTPATIENT_CLINIC_OR_DEPARTMENT_OTHER): Payer: Medicare Other | Admitting: Nurse Practitioner

## 2015-02-21 ENCOUNTER — Other Ambulatory Visit: Payer: Self-pay | Admitting: *Deleted

## 2015-02-21 ENCOUNTER — Emergency Department (HOSPITAL_COMMUNITY)
Admission: EM | Admit: 2015-02-21 | Discharge: 2015-02-21 | Disposition: A | Discharge status: Home / Self Care | Payer: Medicare Other | Attending: Emergency Medicine | Admitting: Emergency Medicine

## 2015-02-21 VITALS — BP 228/124 | HR 64 | Temp 98.6°F | Resp 18 | Ht 67.0 in | Wt 159.8 lb

## 2015-02-21 DIAGNOSIS — Z880 Allergy status to penicillin: Secondary | ICD-10-CM | POA: Diagnosis not present

## 2015-02-21 DIAGNOSIS — Z862 Personal history of diseases of the blood and blood-forming organs and certain disorders involving the immune mechanism: Secondary | ICD-10-CM | POA: Insufficient documentation

## 2015-02-21 DIAGNOSIS — E0510 Thyrotoxicosis with toxic single thyroid nodule without thyrotoxic crisis or storm: Secondary | ICD-10-CM | POA: Insufficient documentation

## 2015-02-21 DIAGNOSIS — C189 Malignant neoplasm of colon, unspecified: Secondary | ICD-10-CM

## 2015-02-21 DIAGNOSIS — Z79899 Other long term (current) drug therapy: Secondary | ICD-10-CM | POA: Diagnosis not present

## 2015-02-21 DIAGNOSIS — I10 Essential (primary) hypertension: Secondary | ICD-10-CM

## 2015-02-21 DIAGNOSIS — Z85038 Personal history of other malignant neoplasm of large intestine: Secondary | ICD-10-CM | POA: Diagnosis not present

## 2015-02-21 NOTE — ED Provider Notes (Signed)
CSN: [id number]     Arrival date & time 02/21/15  1228 History   First MD Initiated Contact with Patient 02/21/15 1359     Chief Complaint  Patient presents with  . Hypertension     (Consider location/radiation/quality/duration/timing/severity/associated sxs/prior Treatment) HPI   64 year old female with hypertension. Patient was had appointment today and noted to be significantly hypertensive and she referred to the emergency room. Patient has no acute complaints. She denies any pain anywhere. No respiratory complaints. No unusual swelling. Urinating fine. No neurological complaints. Patient reports that she missed a dose of clonidine last night, but otherwise has been compliant with her medications.  Past Medical History  Diagnosis Date  . Anemia   . Hypertension   . Thyroid disease   . Hyperthyroidism     GRAVES DISEASE  . Colon cancer dx'd 11/2009    S/P HEMI-COLECTOMY AND FINISHED CHEMO 11/24/10 -DR. ODOGWU  . Complication of anesthesia     ALWAYS FEELS VERY SLEEPY WAKING UP  . Blood transfusion without reported diagnosis    Past Surgical History  Procedure Laterality Date  . Colectomy      partial  . Port-a-cath removal  11/04/11  . Needle bx thyroid    . Thyroidectomy  02/25/2012    Procedure: THYROIDECTOMY;  Surgeon: Pedro Earls, MD;  Location: [facility];  Service: General;;  . Hematoma evacuation  02/26/2012    Procedure: EVACUATION HEMATOMA;  Surgeon: Pedro Earls, MD;  Location: [facility];  Service: General;  Laterality: N/A;  irrigation and drainage of hematoma and placement of drain.   Family History  Problem Relation Age of Onset  . Cancer Father     prostat  . Hypertension Sister   . Thyroid disease Sister   . Hypertension Brother   . Hypertension Sister   . Thyroid disease Sister   . Hypertension Sister   . Hypertension Sister   . Hypertension Sister   . Hypertension Sister   . Hypertension Brother    Social History  Substance Use Topics  . Smoking  status: Never Smoker   . Smokeless tobacco: Never Used     Comment: Pt states she has been exposed to lots of secondhand smoke.    Marland Kitchen Alcohol Use: No   OB History    No data available     Review of Systems    Allergies  Penicillins  Home Medications   Prior to Admission medications   Medication Sig Start Date End Date Taking? Authorizing Provider  amitriptyline (ELAVIL) 25 MG tablet Take 1 tablet (25 mg total) by mouth at bedtime. Patient not taking: Reported on 02/21/2015 09/25/13   [name], MD  cloNIDine (CATAPRES) 0.2 MG tablet Take 1 tablet (0.2 mg total) by mouth 2 (two) times daily. 08/06/14   [name], MD  Coenzyme Q10-Fish Oil-Vit E (CO-Q 10 OMEGA-3 FISH OIL PO) Take by mouth.    Historical Provider, MD  hydrochlorothiazide (HYDRODIURIL) 25 MG tablet Take 1 tablet (25 mg total) by mouth daily with breakfast. 08/06/14   [name], MD  levothyroxine (SYNTHROID, LEVOTHROID) 125 MCG tablet TAKE 1 TABLET BY MOUTH DAILY 12/31/14   [name], MD  lisinopril (PRINIVIL,ZESTRIL) 40 MG tablet TAKE 1 TABLET BY MOUTH DAILY Patient not taking: Reported on 02/21/2015 12/31/14   [name], MD  Multiple Vitamins-Minerals (CENTRUM SILVER) CHEW Chew 1 tablet by mouth daily.     Historical Provider, MD  OVER THE COUNTER MEDICATION "Vitamins a-e"  Historical Provider, MD  vitamin E (VITAMIN E) 1000 UNIT capsule Take 1,000 Units by mouth daily.    Historical Provider, MD   BP 239/121 mmHg  Pulse 68  Temp(Src) 97.6 F (36.4 C) (Oral)  Resp 18  SpO2 99% Physical Exam  Constitutional: She is oriented to person, place, and time. She appears well-developed and well-nourished. No distress.  HENT:  Head: Normocephalic and atraumatic.  Eyes: Conjunctivae are normal. Right eye exhibits no discharge. Left eye exhibits no discharge.  Neck: Neck supple.  Cardiovascular: Normal rate, regular rhythm and normal heart sounds.  Exam reveals no gallop and no friction rub.   No murmur  heard. Pulmonary/Chest: Effort normal and breath sounds normal. No respiratory distress.  Abdominal: Soft. She exhibits no distension. There is no tenderness.  Musculoskeletal: She exhibits no edema or tenderness.  Neurological: She is alert and oriented to person, place, and time. No cranial nerve deficit. She exhibits normal muscle tone. Coordination normal.  Speech clear. Content appropriate. Follows commands. Continue to nose testing bilaterally. Gait is steady.  Skin: Skin is warm and dry.  Psychiatric: She has a normal mood and affect. Her behavior is normal. Thought content normal.  Nursing note and vitals reviewed.   ED Course  Procedures (including critical care time) Labs Review Labs Reviewed - No data to display  Imaging Review No results found. I have personally reviewed and evaluated these images and lab results as part of my medical decision-making.   EKG Interpretation None      MDM   Final diagnoses:  Essential hypertension    64 year old female with asymptomatic hypertension. Patient is very hypertensive, but no acute complaints that I can contribute to it. No emergent need for blood pressure reduction. This was discussed with patient and she is comfortable with plan. Understands the need to keep a log of her blood pressures on a consistent basis follow-up PCP for further management.  Virgel Manifold, MD 03/07/15 [id number]

## 2015-02-21 NOTE — Telephone Encounter (Signed)
[facility] called earlier. Pt's BP was 227/123. They spoke with Dr. Everlene Farrier. He advised them to advise pt to go to the ED.

## 2015-02-21 NOTE — Telephone Encounter (Signed)
As per Eliezer Lofts to Dr. Arlyss Queen to inform him Ms. Ressler was at the [facility] for visit, BP was taken and is 227/132, Pt. States she took her BP medication this morning. Call placed to see if  Dr. Everlene Farrier wants Pt. To come for a visit today or go to the Emergency Room . Dr. Everlene Farrier stated that Pt. Should go to the Emergency Room. Pt. Transported to [facility] Emergency Room.

## 2015-02-21 NOTE — Discharge Instructions (Signed)

## 2015-02-21 NOTE — Progress Notes (Signed)
  [place] OFFICE PROGRESS NOTE   Diagnosis:  Colon cancer  INTERVAL HISTORY:   Ms. Tello returns for follow-up. She reports feeling well. She has a good appetite. No weight loss. She denies abdominal pain. No change in bowel habits. No bloody or black stools.  Her blood pressure is markedly elevated in the office. She reports missing her dose of clonidine at bedtime last night. This morning she took clonidine, hydrochlorothiazide and lisinopril. She denies any visual disturbance. No headaches. No shortness of breath or chest pain.  Objective:  Vital signs in last 24 hours:  Blood pressure 228/124, pulse 64, temperature 98.6 F (37 C), temperature source Oral, resp. rate 18, height 5\' 7"  (1.702 m), weight 159 lb 12.8 oz (72.485 kg), SpO2 99 %. manual blood pressure 210/140    HEENT: No thrush or ulcers. Lymphatics: No palpable cervical, supra clavicular, axillary or inguinal lymph nodes. Resp: Lungs clear bilaterally. Cardio: Regular rate and rhythm. GI: Abdomen soft and nontender. No organomegaly. No mass. Vascular: No leg edema. Neuro: Alert and oriented.    Lab Results:  Lab Results  Component Value Date   WBC 4.8 10/09/2014   HGB 12.4 10/09/2014   HCT 38.2 10/09/2014   MCV 84.6 10/09/2014   PLT 277 10/09/2014   NEUTROABS 2.9 10/09/2014    Imaging:  No results found.  Medications: I have reviewed the patient's current medications.  Assessment/Plan: 1. Stage III (T3 N1), grade 2, adenocarcinoma of the ascending colon, status post a right hemicolectomy 11/17/2009, status post adjuvant FOLFOX for 12 cycles completed January 2012   Colonoscopy December 2014, status post removal of tubular adenomas from the descending colon and rectum  2. 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 Loanne Drilling  3. Benign right breast biopsy  01/25/2013-hamartomatous lesion with calcifications  4. Small bilateral pleural effusions on a chest CT 05/04/2013, no effusion on the CT 10/05/13 5. Hypertension     Disposition: Ms. Gaynor remains in clinical remission from colon cancer. We will follow-up on the CEA from today. She will return for a follow-up visit and CEA in 6 months.  Her blood pressure is markedly elevated. She reported to me that she took her blood pressure medications this morning as prescribed. I recommended the emergency department. She asked [place] to call Dr. Perfecto Kingdom office for input. Dr. Everlene Farrier was in agreement with sending her to the emergency department. She was transported in a wheelchair.    Ned Card ANP/GNP-BC   02/21/2015  12:49 PM

## 2015-02-21 NOTE — ED Notes (Signed)
Pt  Brought by cancer center.  Pt has taken all of her bp meds.  States bp is 227/132.  Repeated with manual.

## 2015-02-21 NOTE — Telephone Encounter (Signed)
lvm for pt regarding to March appt.... °

## 2015-02-22 LAB — CEA: CEA: <0.5 ng/mL (ref 0.0–5.0)

## 2015-02-24 ENCOUNTER — Telehealth: Payer: Self-pay | Admitting: Nurse Practitioner

## 2015-02-24 NOTE — Telephone Encounter (Signed)
Called Debra Huber to inform her. Her CEA was in normal range no answer will try to return call.

## 2015-02-24 NOTE — Telephone Encounter (Signed)
-----   Message from Owens Shark, NP sent at 02/24/2015 11:40 AM EDT ----- Please let her know CEA was in normal range.

## 2015-02-25 ENCOUNTER — Telehealth: Payer: Self-pay | Admitting: Nurse Practitioner

## 2015-02-25 NOTE — Telephone Encounter (Signed)
Returned call to Debra Huber no answer left message to return call to Cancer center .

## 2015-02-25 NOTE — Telephone Encounter (Signed)
-----   Message from Owens Shark, NP sent at 02/24/2015 11:40 AM EDT ----- Please let her know CEA was in normal range.

## 2015-03-05 ENCOUNTER — Encounter (INDEPENDENT_AMBULATORY_CARE_PROVIDER_SITE_OTHER): Payer: Medicare Other | Admitting: Ophthalmology

## 2015-03-05 DIAGNOSIS — H43813 Vitreous degeneration, bilateral: Secondary | ICD-10-CM

## 2015-03-05 DIAGNOSIS — H35033 Hypertensive retinopathy, bilateral: Secondary | ICD-10-CM | POA: Diagnosis not present

## 2015-03-05 DIAGNOSIS — I10 Essential (primary) hypertension: Secondary | ICD-10-CM

## 2015-03-05 DIAGNOSIS — H4423 Degenerative myopia, bilateral: Secondary | ICD-10-CM | POA: Diagnosis not present

## 2015-03-18 ENCOUNTER — Encounter: Payer: Self-pay | Admitting: Emergency Medicine

## 2015-03-25 ENCOUNTER — Ambulatory Visit: Payer: Medicare Other | Admitting: Emergency Medicine

## 2015-03-25 ENCOUNTER — Encounter: Payer: Self-pay | Admitting: Emergency Medicine

## 2015-03-25 ENCOUNTER — Telehealth: Payer: Self-pay | Admitting: *Deleted

## 2015-03-25 VITALS — BP 164/120 | HR 82 | Temp 98.1°F | Resp 16 | Ht 67.0 in | Wt 163.0 lb

## 2015-03-25 DIAGNOSIS — I10 Essential (primary) hypertension: Secondary | ICD-10-CM

## 2015-03-25 DIAGNOSIS — C73 Malignant neoplasm of thyroid gland: Secondary | ICD-10-CM

## 2015-03-25 DIAGNOSIS — F439 Reaction to severe stress, unspecified: Secondary | ICD-10-CM

## 2015-03-25 LAB — CBC WITH DIFFERENTIAL/PLATELET
BASOS PCT: 1 % (ref 0–1)
Basophils Absolute: 0 10*3/uL (ref 0.0–0.1)
Eosinophils Absolute: 0 10*3/uL (ref 0.0–0.7)
Eosinophils Relative: 1 % (ref 0–5)
HCT: 38.9 % (ref 36.0–46.0)
Hemoglobin: 13 g/dL (ref 12.0–15.0)
LYMPHS ABS: 1.1 10*3/uL (ref 0.7–4.0)
Lymphocytes Relative: 26 % (ref 12–46)
MCH: 27.5 pg (ref 26.0–34.0)
MCHC: 33.4 g/dL (ref 30.0–36.0)
MCV: 82.4 fL (ref 78.0–100.0)
MONOS PCT: 8 % (ref 3–12)
MPV: 11.2 fL (ref 8.6–12.4)
Monocytes Absolute: 0.3 10*3/uL (ref 0.1–1.0)
Neutro Abs: 2.6 10*3/uL (ref 1.7–7.7)
Neutrophils Relative %: 64 % (ref 43–77)
PLATELETS: 276 10*3/uL (ref 150–400)
RBC: 4.72 MIL/uL (ref 3.87–5.11)
RDW: 15.7 % — ABNORMAL HIGH (ref 11.5–15.5)
WBC: 4.1 10*3/uL (ref 4.0–10.5)

## 2015-03-25 LAB — BASIC METABOLIC PANEL WITH GFR
BUN: 7 mg/dL (ref 7–25)
CALCIUM: 8.7 mg/dL (ref 8.6–10.4)
CO2: 30 mmol/L (ref 20–31)
Chloride: 98 mmol/L (ref 98–110)
Creat: 0.59 mg/dL (ref 0.50–0.99)
Glucose, Bld: 87 mg/dL (ref 65–99)
POTASSIUM: 4 mmol/L (ref 3.5–5.3)
SODIUM: 134 mmol/L — AB (ref 135–146)

## 2015-03-25 MED ORDER — LISINOPRIL 40 MG PO TABS: 40.0000 mg | ORAL_TABLET | Freq: Every day | ORAL | Status: DC

## 2015-03-25 NOTE — Progress Notes (Addendum)
This chart was scribed for Arlyss Queen, MD by Moises Blood, Medical Scribe. This patient was seen in Room 23 and the patient's care was started 11:17 AM.  Chief Complaint:  Chief Complaint  Patient presents with  . Hypertension    states has been better since ER visit  . Stress    states family stress/ has had increased stress since opening business / denies depression     HPI: Debra Huber is a 64 y.o. female who reports to [facility] today with hypertension.  Her BP measured at 239/121 in ED and 228/124 at oncology 2 days ago. She had colon cancer and thyroid cancer currently in remission. Her BP measured at 180/110 today in triage. She's taking her medications but she denies taking her pills at night. She saw cardiology and was given prescription previously but was stopped.  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 Overall, her partner doesn't stress her out. She worries about her son, who has 9 daughters, and has to call him daily. She moved to [place] because her partner's daughter lives here. Her partner is 91 y.o and is retired. She does cardio, elliptical machine and walking. She denies diet changes. She was recommended to see counseling.   Past Medical History  Diagnosis Date  . Anemia   . Hypertension   . Thyroid disease   . Hyperthyroidism     GRAVES DISEASE  . Colon cancer ([place]) dx'd 11/2009    S/P HEMI-COLECTOMY AND FINISHED CHEMO 11/24/10 -DR. ODOGWU  . Complication of anesthesia     ALWAYS FEELS VERY SLEEPY WAKING UP  . Blood transfusion without reported diagnosis    Past Surgical History  Procedure Laterality Date  . Colectomy      partial  . Port-a-cath removal  11/04/11  . Needle bx thyroid    . Thyroidectomy  02/25/2012    Procedure: THYROIDECTOMY;  Surgeon: Pedro Earls, MD;  Location: [facility];  Service: General;;  . Hematoma  evacuation  02/26/2012    Procedure: EVACUATION HEMATOMA;  Surgeon: Pedro Earls, MD;  Location: [facility];  Service: General;  Laterality: N/A;  irrigation and drainage of hematoma and placement of drain.   Social History   Social History  . Marital Status: Single    Spouse Name: N/A  . Number of Children: N/A  . Years of Education: N/A   Social History Main Topics  . Smoking status: Never Smoker   . Smokeless tobacco: Never Used     Comment: Pt states she has been exposed to lots of secondhand smoke.    Marland Kitchen Alcohol Use: No  . Drug Use: No  . Sexual Activity: Yes   Other Topics Concern  . None   Social History Narrative   Family History  Problem Relation Age of Onset  . Cancer Father     prostat  . Hypertension Sister   . Thyroid disease Sister   . Hypertension Brother   . Hypertension Sister   . Thyroid disease Sister   . Hypertension Sister   . Hypertension Sister   . Hypertension Sister   . Hypertension Sister   . Hypertension Brother    Allergies  Allergen Reactions  . Penicillins Swelling    Did PCN reaction causing immediate rash, facial/tongue/throat swelling, SOB or lightheadedness with hypotension: Yes Did PCN reaction causing severe rash involving mucus membranes or skin  necrosis: unknown Did PCN reaction that required hospitalization Yes- was in the hospital and i was given penicillin and had a reaction Did PCN reaction occurring within the last 10 years: No, about 15 years or more If all of the above answers are "NO", then may proceed with Cephalosporin use.    Prior to Admission medications   Medication Sig Start Date End Date Taking? Authorizing Provider  amitriptyline (ELAVIL) 25 MG tablet Take 1 tablet (25 mg total) by mouth at bedtime. Patient not taking: Reported on 02/21/2015 09/25/13   [name], MD  cloNIDine (CATAPRES) 0.2 MG tablet Take 1 tablet (0.2 mg total) by mouth 2 (two) times daily. 08/06/14   [name], MD  Coenzyme Q10-Fish  Oil-Vit E (CO-Q 10 OMEGA-3 FISH OIL PO) Take by mouth.    Historical Provider, MD  hydrochlorothiazide (HYDRODIURIL) 25 MG tablet Take 1 tablet (25 mg total) by mouth daily with breakfast. 08/06/14   [name], MD  levothyroxine (SYNTHROID, LEVOTHROID) 125 MCG tablet TAKE 1 TABLET BY MOUTH DAILY 12/31/14   [name], MD  Multiple Vitamins-Minerals (CENTRUM SILVER) CHEW Chew 1 tablet by mouth daily.     Historical Provider, MD  OVER THE COUNTER MEDICATION "Vitamins a-e"    Historical Provider, MD  vitamin E (VITAMIN E) 1000 UNIT capsule Take 1,000 Units by mouth daily.    Historical Provider, MD     ROS:  Constitutional: negative for chills, fever, night sweats, weight changes, or fatigue  HEENT: negative for vision changes, hearing loss, congestion, rhinorrhea, ST, epistaxis, or sinus pressure Cardiovascular: negative for chest pain or palpitations Respiratory: negative for hemoptysis, wheezing, shortness of breath, or cough Abdominal: negative for abdominal pain, nausea, vomiting, diarrhea, or constipation Dermatological: negative for rash Neurologic: negative for headache, dizziness, or syncope All other systems reviewed and are otherwise negative with the exception to those above and in the HPI.  PHYSICAL EXAM: Filed Vitals:   03/25/15 1106  BP: 164/120  Pulse: 82  Temp: 98.1 F (36.7 C)  Resp: 16   Body mass index is 25.52 kg/(m^2).   General: Alert, no acute distress HEENT:  Normocephalic, atraumatic, oropharynx patent. Eye: Juliette Mangle [facility] Cardiovascular:  Regular rate and rhythm, no rubs murmurs or gallops.  No Carotid bruits, radial pulse intact. No pedal edema.  Respiratory: Clear to auscultation bilaterally.  No wheezes, rales, or rhonchi.  No cyanosis, no use of accessory musculature Abdominal: No organomegaly, abdomen is soft and non-tender, positive bowel sounds. No masses. Musculoskeletal: Gait intact. No edema, tenderness Skin: No rashes. Neurologic: Facial  musculature symmetric. Psychiatric: Patient acts appropriately throughout our interaction.  Lymphatic: No cervical or submandibular lymphadenopathy Genitourinary/Anorectal: No acute findings Recheck BP (sitting, right arm): 180/120 Recheck BP (sitting, left arm): 180/120  LABS: Results for orders placed or performed in visit on 02/21/15  CEA  Result Value Ref Range   CEA <0.5 0.0 - 5.0 ng/mL     EKG/XRAY:   Primary read interpreted by [facility]rier at [facility].   ASSESSMENT/PLAN: Lisinopril did not show up on her medication sheet though pharmacy says she is been getting the prescription filled. She went home to get all her medications and will bring them back in so we can review them and get an accurate assessment of what she is taking she did bring back her medications. She will start to take her blood pressure twice a day. She will continue clonidine lisinopril and HCTZ. She had trouble with bradycardia in the past with  beta blockers. Referral made to cardiology for their help. By signing my name below, I, Moises Blood, attest that this documentation has been prepared under the direction and in the presence of Arlyss Queen, MD. Electronically Signed: Moises Blood, [place]. 03/25/2015 , 1:11 PM .    Gross sideeffects, risk and benefits, and alternatives of medications d/w patient. Patient is aware that all medications have potential sideeffects and we are unable to predict every sideeffect or drug-drug interaction that may occur.  Arlyss Queen MD 03/25/2015 1:11 PM

## 2015-03-25 NOTE — Patient Instructions (Signed)
Dr Everlene Farrier wants you to see Debra Huber, [place], [place] [address], his phone number is [phone] please call for an appointment You will get a call from the Cardiology office Dr Woody Seller

## 2015-03-26 LAB — T4, FREE: Free T4: 1.17 ng/dL (ref 0.80–1.80)

## 2015-03-26 LAB — TSH: TSH: 6.253 u[IU]/mL — AB (ref 0.350–4.500)

## 2015-04-03 NOTE — Telephone Encounter (Signed)
error 

## 2015-06-07 ENCOUNTER — Other Ambulatory Visit: Payer: Self-pay | Admitting: Emergency Medicine

## 2015-06-11 NOTE — Telephone Encounter (Signed)
Patient is calling to follow up on refill request. She states she has one pill left

## 2015-07-09 DIAGNOSIS — H04123 Dry eye syndrome of bilateral lacrimal glands: Secondary | ICD-10-CM | POA: Diagnosis not present

## 2015-07-09 DIAGNOSIS — H40023 Open angle with borderline findings, high risk, bilateral: Secondary | ICD-10-CM | POA: Diagnosis not present

## 2015-07-15 ENCOUNTER — Ambulatory Visit (INDEPENDENT_AMBULATORY_CARE_PROVIDER_SITE_OTHER): Payer: Medicare Other | Admitting: Emergency Medicine

## 2015-07-15 VITALS — BP 159/113 | HR 60 | Temp 98.0°F | Resp 16 | Ht 67.0 in | Wt 166.0 lb

## 2015-07-15 DIAGNOSIS — I10 Essential (primary) hypertension: Secondary | ICD-10-CM

## 2015-07-15 DIAGNOSIS — C73 Malignant neoplasm of thyroid gland: Secondary | ICD-10-CM

## 2015-07-15 DIAGNOSIS — F439 Reaction to severe stress, unspecified: Secondary | ICD-10-CM

## 2015-07-15 DIAGNOSIS — Z658 Other specified problems related to psychosocial circumstances: Secondary | ICD-10-CM

## 2015-07-15 NOTE — Progress Notes (Signed)
Patient ID: [name], female   DOB: 05-Apr-1951, 65 y.o.   MRN: [id number]     By signing my name below, I, Zola Button, attest that this documentation has been prepared under the direction and in the presence of Arlyss Queen, MD.  Electronically Signed: Zola Button, Medical Scribe. 07/15/2015. 11:04 AM.   Chief Complaint:  Chief Complaint  Patient presents with  . Medication Refill  . Shoulder Pain    right, requesting letter for work restricting stair climbing  . Hip Pain    right  . Fall    1 week ago  . Hypertension    HPI: Debra Huber is a 65 y.o. female with a history of HTN who reports to [facility] today for a medication refill. Patient states she has been compliant with her medications. Her FMHx includes HTN. She had been on beta-blockers in the past, but was taken off of them by her endocrinologist.  She had an echocardiogram in August 2015 which showed large dilated left atrium with concentric left ventricular hypertrophy. She had workup for pheochromocytoma in the past, this was normal.  Patient had a fall about a week ago. She reports having a bruise to her hip and stiffness and pain to her right shoulder. She would like a letter to her landlord restricting stair climbing.  She also reports having nausea for the past few days. Patient denies chest pain.  She exercises on most days and goes to the gym where she lives at.  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  Past Medical History  Diagnosis Date  . Anemia   . Hypertension   . Thyroid disease   . Hyperthyroidism     GRAVES DISEASE  . Colon cancer ([place]) dx'd 11/2009    S/P HEMI-COLECTOMY AND FINISHED CHEMO 11/24/10 -DR. ODOGWU  . Complication of anesthesia     ALWAYS FEELS VERY SLEEPY WAKING UP  . Blood transfusion without reported diagnosis    Past Surgical History  Procedure Laterality Date  . Colectomy      partial  . Port-a-cath removal  11/04/11  . Needle bx thyroid    . Thyroidectomy  02/25/2012   Procedure: THYROIDECTOMY;  Surgeon: Pedro Earls, MD;  Location: [facility];  Service: General;;  . Hematoma evacuation  02/26/2012    Procedure: EVACUATION HEMATOMA;  Surgeon: Pedro Earls, MD;  Location: [facility];  Service: General;  Laterality: N/A;  irrigation and drainage of hematoma and placement of drain.   Social History   Social History  . Marital Status: Single    Spouse Name: N/A  . Number of Children: N/A  . Years of Education: N/A   Social History Main Topics  . Smoking status: Never Smoker   . Smokeless tobacco: Never Used     Comment: Pt states she has been exposed to lots of secondhand smoke.    Marland Kitchen Alcohol Use: No  . Drug Use: No  . Sexual Activity: Yes   Other Topics Concern  . Not on file   Social History Narrative   Family History  Problem Relation Age of Onset  . Cancer Father     prostat  . Hypertension Sister   . Thyroid disease Sister   . Hypertension Brother   . Hypertension Sister   . Thyroid disease Sister   . Hypertension Sister   . Hypertension Sister   . Hypertension Sister   . Hypertension Sister   . Hypertension Brother    Allergies  Allergen  Reactions  . Penicillins Swelling    Did PCN reaction causing immediate rash, facial/tongue/throat swelling, SOB or lightheadedness with hypotension: Yes Did PCN reaction causing severe rash involving mucus membranes or skin necrosis: unknown Did PCN reaction that required hospitalization Yes- was in the hospital and i was given penicillin and had a reaction Did PCN reaction occurring within the last 10 years: No, about 15 years or more If all of the above answers are "NO", then may proceed with Cephalosporin use.    Prior to Admission medications   Medication Sig Start Date End Date Taking? Authorizing Provider  amitriptyline (ELAVIL) 25 MG tablet Take 1 tablet (25 mg total) by mouth at bedtime. 09/25/13  Yes [name], MD  cloNIDine (CATAPRES) 0.2 MG tablet Take 1 tablet (0.2 mg total) by  mouth 2 (two) times daily. 08/06/14  Yes [name], MD  Coenzyme Q10-Fish Oil-Vit E (CO-Q 10 OMEGA-3 FISH OIL PO) Take by mouth.   Yes Historical Provider, MD  hydrochlorothiazide (HYDRODIURIL) 25 MG tablet Take 1 tablet (25 mg total) by mouth daily with breakfast. 08/06/14  Yes [name], MD  levothyroxine (SYNTHROID, LEVOTHROID) 125 MCG tablet take 1 tablet by mouth once daily 06/12/15  Yes [name], MD  lisinopril (PRINIVIL,ZESTRIL) 40 MG tablet Take 1 tablet (40 mg total) by mouth daily. 03/25/15  Yes [name], MD  Multiple Vitamins-Minerals (CENTRUM SILVER) CHEW Chew 1 tablet by mouth daily.    Yes Historical Provider, MD  OVER THE COUNTER MEDICATION "Vitamins a-e"   Yes Historical Provider, MD  vitamin E (VITAMIN E) 1000 UNIT capsule Take 1,000 Units by mouth daily.   Yes Historical Provider, MD     ROS: The patient denies fevers, chills, night sweats, unintentional weight loss, chest pain, palpitations, wheezing, dyspnea on exertion, vomiting, abdominal pain, dysuria, hematuria, melena, numbness, weakness, or tingling.  All other systems have been reviewed and were otherwise negative with the exception of those mentioned in the HPI and as above.    PHYSICAL EXAM: Filed Vitals:   07/15/15 1038 07/15/15 1039  BP: 153/101 159/113  Pulse: 68 60  Temp: 98 F (36.7 C)   Resp: 16    Body mass index is 25.99 kg/(m^2).   General: Alert, no acute distress. Slight stutter in her voice, unchanged from previous. HEENT:  Normocephalic, atraumatic, oropharynx patent. Eye: Juliette Mangle [facility] Cardiovascular:  Regular rate and rhythm. S4 gallop. Repeat blood pressure: 204/120 (left arm), 194/110 (right arm). Respiratory: Clear to auscultation bilaterally.  No wheezes, rales, or rhonchi.  No cyanosis, no use of accessory musculature Abdominal: No organomegaly, abdomen is soft and non-tender, positive bowel sounds.  No masses. Musculoskeletal: Gait intact. No edema, tenderness Skin:  Thyroidectomy scar. Neurologic: Facial musculature symmetric. Psychiatric: Patient acts appropriately throughout our interaction. Lymphatic: No cervical or submandibular lymphadenopathy      LABS:    EKG/XRAY:   Primary read interpreted by Dr. Everlene Farrier at [facility]sinus bradycardia and no acute changes.   ASSESSMENT/PLAN: I'm still worried about compliance. She states she is taking her medication regularly. I did refer her back to [facility] for their help with blood pressure control. She is under a lot of stress at home. She plans on separating from her partner and getting an apartment by herself. She asked me to write a letter so she does not have to do steps at her new apartment.I personally performed the services described in this documentation, which was scribed in my presence. The recorded information  has been reviewed and is accurate.    Gross sideeffects, risk and benefits, and alternatives of medications d/w patient. Patient is aware that all medications have potential sideeffects and we are unable to predict every sideeffect or drug-drug interaction that may occur.  Arlyss Queen MD 07/15/2015 11:04 AM

## 2015-07-29 DIAGNOSIS — I119 Hypertensive heart disease without heart failure: Secondary | ICD-10-CM | POA: Diagnosis not present

## 2015-07-29 DIAGNOSIS — R001 Bradycardia, unspecified: Secondary | ICD-10-CM | POA: Diagnosis not present

## 2015-08-21 ENCOUNTER — Other Ambulatory Visit: Payer: Medicare Other

## 2015-08-21 ENCOUNTER — Ambulatory Visit: Payer: Medicare Other | Admitting: Oncology

## 2015-08-25 ENCOUNTER — Other Ambulatory Visit: Payer: Self-pay | Admitting: Emergency Medicine

## 2015-09-05 ENCOUNTER — Telehealth: Payer: Self-pay | Admitting: Family Medicine

## 2015-09-05 NOTE — Telephone Encounter (Signed)
LEFT A MESSAGE FOR PATIENT TO RETURN CALL TO SEE IF SHE HAS HAD A MAMMOGRAM IN THE PAST YEAR.  IF SO WHERE AND WHEN?  IF NOT WE NEED TO SCHEDULE HER ONE.

## 2015-09-18 ENCOUNTER — Ambulatory Visit (INDEPENDENT_AMBULATORY_CARE_PROVIDER_SITE_OTHER): Payer: Medicare Other | Admitting: Emergency Medicine

## 2015-09-18 ENCOUNTER — Encounter: Payer: Self-pay | Admitting: Emergency Medicine

## 2015-09-18 VITALS — BP 150/102 | HR 65 | Temp 97.4°F | Resp 16 | Ht 66.75 in | Wt 165.6 lb

## 2015-09-18 DIAGNOSIS — F439 Reaction to severe stress, unspecified: Secondary | ICD-10-CM

## 2015-09-18 DIAGNOSIS — E890 Postprocedural hypothyroidism: Secondary | ICD-10-CM

## 2015-09-18 DIAGNOSIS — Z13220 Encounter for screening for lipoid disorders: Secondary | ICD-10-CM | POA: Diagnosis not present

## 2015-09-18 DIAGNOSIS — I10 Essential (primary) hypertension: Secondary | ICD-10-CM | POA: Diagnosis not present

## 2015-09-18 DIAGNOSIS — Z658 Other specified problems related to psychosocial circumstances: Secondary | ICD-10-CM

## 2015-09-18 DIAGNOSIS — E785 Hyperlipidemia, unspecified: Secondary | ICD-10-CM

## 2015-09-18 DIAGNOSIS — Z1159 Encounter for screening for other viral diseases: Secondary | ICD-10-CM

## 2015-09-18 DIAGNOSIS — C73 Malignant neoplasm of thyroid gland: Secondary | ICD-10-CM

## 2015-09-18 LAB — THYROID PANEL WITH TSH
Free Thyroxine Index: 3.2 (ref 1.4–3.8)
T3 UPTAKE: 30 % (ref 22–35)
T4, Total: 10.6 ug/dL (ref 4.5–12.0)
TSH: 4.32 m[IU]/L

## 2015-09-18 LAB — CBC WITH DIFFERENTIAL/PLATELET
BASOS PCT: 0 %
Basophils Absolute: 0 cells/uL (ref 0–200)
Eosinophils Absolute: 49 cells/uL (ref 15–500)
Eosinophils Relative: 1 %
HEMATOCRIT: 40.4 % (ref 35.0–45.0)
HEMOGLOBIN: 13.6 g/dL (ref 11.7–15.5)
LYMPHS ABS: 1519 {cells}/uL (ref 850–3900)
Lymphocytes Relative: 31 %
MCH: 27.9 pg (ref 27.0–33.0)
MCHC: 33.7 g/dL (ref 32.0–36.0)
MCV: 83 fL (ref 80.0–100.0)
MONO ABS: 392 {cells}/uL (ref 200–950)
MONOS PCT: 8 %
MPV: 11 fL (ref 7.5–12.5)
NEUTROS ABS: 2940 {cells}/uL (ref 1500–7800)
Neutrophils Relative %: 60 %
PLATELETS: 318 10*3/uL (ref 140–400)
RBC: 4.87 MIL/uL (ref 3.80–5.10)
RDW: 15.6 % — ABNORMAL HIGH (ref 11.0–15.0)
WBC: 4.9 10*3/uL (ref 3.8–10.8)

## 2015-09-18 LAB — LIPID PANEL
CHOLESTEROL: 208 mg/dL — AB (ref 125–200)
HDL: 49 mg/dL (ref >=46)
LDL CALC: 133 mg/dL — AB (ref <130)
Total CHOL/HDL Ratio: 4.2 Ratio (ref <=5.0)
Triglycerides: 129 mg/dL (ref <150)
VLDL: 26 mg/dL (ref <30)

## 2015-09-18 LAB — COMPLETE METABOLIC PANEL WITH GFR
ALT: 18 U/L (ref 6–29)
AST: 29 U/L (ref 10–35)
Albumin: 4.1 g/dL (ref 3.6–5.1)
Alkaline Phosphatase: 52 U/L (ref 33–130)
BILIRUBIN TOTAL: 0.5 mg/dL (ref 0.2–1.2)
BUN: 12 mg/dL (ref 7–25)
CO2: 28 mmol/L (ref 20–31)
CREATININE: 0.66 mg/dL (ref 0.50–0.99)
Calcium: 9.2 mg/dL (ref 8.6–10.4)
Chloride: 94 mmol/L — ABNORMAL LOW (ref 98–110)
GFR, Est Non African American: >89 mL/min (ref >=60)
Glucose, Bld: 76 mg/dL (ref 65–99)
Potassium: 3.9 mmol/L (ref 3.5–5.3)
Sodium: 134 mmol/L — ABNORMAL LOW (ref 135–146)
TOTAL PROTEIN: 7.3 g/dL (ref 6.1–8.1)

## 2015-09-18 LAB — HEPATITIS C ANTIBODY: HCV Ab: NEGATIVE

## 2015-09-18 MED ORDER — LEVOTHYROXINE SODIUM 125 MCG PO TABS: 125.0000 ug | ORAL_TABLET | Freq: Every day | ORAL | Status: DC

## 2015-09-18 MED ORDER — AMLODIPINE BESYLATE 5 MG PO TABS: 5.0000 mg | ORAL_TABLET | Freq: Every day | ORAL | Status: AC

## 2015-09-18 MED ORDER — HYDROCHLOROTHIAZIDE 25 MG PO TABS: 25.0000 mg | ORAL_TABLET | Freq: Every day | ORAL | Status: DC

## 2015-09-18 MED ORDER — LISINOPRIL 40 MG PO TABS: 40.0000 mg | ORAL_TABLET | Freq: Every day | ORAL | Status: DC

## 2015-09-18 MED ORDER — CLONIDINE HCL 0.2 MG PO TABS: 0.2000 mg | ORAL_TABLET | Freq: Two times a day (BID) | ORAL | Status: DC

## 2015-09-18 MED ORDER — AMITRIPTYLINE HCL 25 MG PO TABS: 25.0000 mg | ORAL_TABLET | Freq: Every day | ORAL | Status: AC

## 2015-09-18 NOTE — Progress Notes (Signed)
Patient ID: Debra Huber, female   DOB: Nov 03, 1950, 65 y.o.   MRN: [id number]    By signing my name below, I, Debra Huber, attest that this documentation has been prepared under the direction and in the presence of Debra Russian, Huber Electronically Signed: Ladene Huber, ED Scribe 09/18/2015 at 1:55 PM.  Chief Complaint:  Chief Complaint  Patient presents with  . Follow-up    BLOOD PRESSURE  . Medication Refill    AMLODIPINE   HPI: Debra Huber is a 65 y.o. female, with a h/o HTN, who reports to [facility] today for a follow-up regarding HTN. Pt reports a recent weight gain. Triage BP: 150/102. Repeat BP during examination on the L: 158/98  Cardiology Pt was seen by cardiologist and started on Norvasc. She requests a medication refill at this visit.   Colon CA  Pt was diagnosed in June 2011. She has a hemi-colectomy and finished chemo in June 2012. She is seen by oncology annually.   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   Past Medical History  Diagnosis Date  . Anemia   . Hypertension   . Thyroid disease   . Hyperthyroidism     GRAVES DISEASE  . Colon cancer ([place]) dx'd 11/2009    S/P HEMI-COLECTOMY AND FINISHED CHEMO 11/24/10 -Debra Huber  . Complication of anesthesia     ALWAYS FEELS VERY SLEEPY WAKING UP  . Blood transfusion without reported diagnosis    Past Surgical History  Procedure Laterality Date  . Colectomy      partial  . Port-a-cath removal  11/04/11  . Needle bx thyroid    . Thyroidectomy  02/25/2012    Procedure: THYROIDECTOMY;  Surgeon: Debra Huber;  Location: [facility];  Service: General;;  . Hematoma evacuation  02/26/2012    Procedure: EVACUATION HEMATOMA;  Surgeon: Debra Huber;  Location: [facility];  Service: General;  Laterality: N/A;  irrigation and drainage of hematoma and placement of drain.   Social History   Social History  . Marital Status: Single    Spouse Name: N/A  . Number of Children: N/A  . Years of Education: N/A   Social History  Main Topics  . Smoking status: Never Smoker   . Smokeless tobacco: Never Used     Comment: Pt states she has been exposed to lots of secondhand smoke.    Marland Kitchen Alcohol Use: No  . Drug Use: No  . Sexual Activity: Yes   Other Topics Concern  . None   Social History Narrative   Family History  Problem Relation Age of Onset  . Cancer Father     prostat  . Hypertension Sister   . Thyroid disease Sister   . Hypertension Brother   . Hypertension Sister   . Thyroid disease Sister   . Hypertension Sister   . Hypertension Sister   . Hypertension Sister   . Hypertension Sister   . Hypertension Brother    Allergies  Allergen Reactions  . Penicillins Swelling    Did PCN reaction causing immediate rash, facial/tongue/throat swelling, SOB or lightheadedness with hypotension: Yes Did PCN reaction causing severe rash involving mucus membranes or skin necrosis: unknown Did PCN reaction that required hospitalization Yes- was in the hospital and i was given penicillin and had a reaction Did PCN reaction occurring within the last 10 years: No, about 15 years or more If all of the above answers are "NO", then may proceed with Cephalosporin use.  Prior to Admission medications   Medication Sig Start Date End Date Taking? Authorizing Provider  amitriptyline (ELAVIL) 25 MG tablet Take 1 tablet (25 mg total) by mouth at bedtime. 09/25/13   Debra Russian, Huber  cloNIDine (CATAPRES) 0.2 MG tablet TAKE 1 TABLET BY MOUTH 2 TIMES DAILY 08/27/15   Debra Russian, Huber  Coenzyme Q10-Fish Oil-Vit E (CO-Q 10 OMEGA-3 FISH OIL PO) Take by mouth.    Historical Provider, Huber  hydrochlorothiazide (HYDRODIURIL) 25 MG tablet TAKE 1 TABLET BY MOUTH DAILY WITH BREAKFAST 08/27/15   Debra Russian, Huber  levothyroxine (SYNTHROID, LEVOTHROID) 125 MCG tablet take 1 tablet by mouth once daily 06/12/15   Debra Russian, Huber  lisinopril (PRINIVIL,ZESTRIL) 40 MG tablet Take 1 tablet (40 mg total) by mouth daily. 03/25/15   Debra Russian,  Huber  Multiple Vitamins-Minerals (CENTRUM SILVER) CHEW Chew 1 tablet by mouth daily.     Historical Provider, Huber  OVER THE COUNTER MEDICATION "Vitamins a-e"    Historical Provider, Huber  vitamin E (VITAMIN E) 1000 UNIT capsule Take 1,000 Units by mouth daily.    Historical Provider, Huber   ROS: The patient denies fevers, chills, night sweats, unintentional weight loss, chest pain, palpitations, wheezing, dyspnea on exertion, nausea, vomiting, abdominal pain, dysuria, hematuria, melena, numbness, weakness, or tingling.   All other systems have been reviewed and were otherwise negative with the exception of those mentioned in the HPI and as above.    PHYSICAL EXAM: Filed Vitals:   09/18/15 1328  BP: 150/102  Pulse: 65  Temp: 97.4 F (36.3 C)  Resp: 16   Body mass index is 26.15 kg/(m^2).  General: Alert, no acute distress HEENT:  Normocephalic, atraumatic, oropharynx patent. Mild speech abnormality; chronic.  Eye: Debra Huber [facility] Cardiovascular: Regular rate and rhythm, no rubs murmurs or gallops. No Carotid bruits, radial pulse intact. No pedal edema.  Respiratory: Clear to auscultation bilaterally. No wheezes, rales, or rhonchi. No cyanosis, no use of accessory musculature Abdominal: No organomegaly, abdomen is soft and non-tender, positive bowel sounds. No masses. Musculoskeletal: Gait intact. No edema, tenderness Skin: No rashes. Neurologic: Facial musculature symmetric. Psychiatric: Patient acts appropriately throughout our interaction. Lymphatic: No cervical or submandibular lymphadenopathy  LABS:  EKG/XRAY:   Primary read interpreted by Debra Huber at [facility].  ASSESSMENT/PLAN:  Blood pressure is elevated but not significantly so. This is really fairly good for you Debra Huber. She has come in with markedly elevated blood pressures in the past. She states she has been compliant with her medications. She has been gaining weight and ready to get back in the gym. She did follow-up with Debra Huber  for cardiac evaluation.I personally performed the services described in this documentation, which was scribed in my presence. The recorded information has been reviewed and is accurate.   Gross sideeffects, risk and benefits, and alternatives of medications d/w patient. Patient is aware that all medications have potential sideeffects and we are unable to predict every sideeffect or drug-drug interaction that may occur.  Arlyss Queen Huber 09/18/2015 1:42 PM

## 2015-09-18 NOTE — Patient Instructions (Addendum)
     IF you received an x-ray today, you will receive an invoice from [facility]. Please contact [facility] at [phone] with questions or concerns regarding your invoice.   IF you received labwork today, you will receive an invoice from [facility]. Please contact [facility] at [phone] with questions or concerns regarding your invoice.   Our billing staff will not be able to assist you with questions regarding bills from these companies.  You will be contacted with the lab results as soon as they are available. The fastest way to get your results is to activate your My Chart account. Instructions are located on the last page of this paperwork. If you have not heard from us regarding the results in 2 weeks, please contact this office.         IF you received an x-ray today, you will receive an invoice from [facility]. Please contact [facility] at [phone] with questions or concerns regarding your invoice.   IF you received labwork today, you will receive an invoice from [facility]. Please contact [facility] at [phone] with questions or concerns regarding your invoice.   Our billing staff will not be able to assist you with questions regarding bills from these companies.  You will be contacted with the lab results as soon as they are available. The fastest way to get your results is to activate your My Chart account. Instructions are located on the last page of this paperwork. If you have not heard from us regarding the results in 2 weeks, please contact this office.     

## 2015-09-19 LAB — THYROGLOBULIN ANTIBODY: THYROGLOBULIN AB: 7 [IU]/mL — AB (ref <2)

## 2015-09-19 LAB — THYROGLOBULIN LEVEL

## 2015-10-14 DIAGNOSIS — H35033 Hypertensive retinopathy, bilateral: Secondary | ICD-10-CM | POA: Diagnosis not present

## 2015-10-14 DIAGNOSIS — H53013 Deprivation amblyopia, bilateral: Secondary | ICD-10-CM | POA: Diagnosis not present

## 2015-10-14 DIAGNOSIS — H353130 Nonexudative age-related macular degeneration, bilateral, stage unspecified: Secondary | ICD-10-CM | POA: Diagnosis not present

## 2015-10-14 DIAGNOSIS — H40023 Open angle with borderline findings, high risk, bilateral: Secondary | ICD-10-CM | POA: Diagnosis not present

## 2015-10-29 ENCOUNTER — Telehealth: Payer: Self-pay | Admitting: Family Medicine

## 2015-10-29 NOTE — Telephone Encounter (Signed)
Phone does not accept incoming calls.  Will send a letter requesting patient to get a mammogram.

## 2016-01-19 ENCOUNTER — Ambulatory Visit: Payer: Medicare Other

## 2016-10-01 ENCOUNTER — Other Ambulatory Visit: Payer: Self-pay | Admitting: Emergency Medicine

## 2016-10-01 NOTE — Telephone Encounter (Signed)
Please call this patient to schedule visit to establish with new PCP and re-evaluation of chronic medical problems, labs, etc.  Previously followed by Dr. Everlene Farrier. Last seen 09/2015.  30-day supplies sent.  Meds ordered this encounter  Medications  . cloNIDine (CATAPRES) 0.2 MG tablet    Sig: TAKE 1 TABLET BY MOUTH 2 TIMES DAILY    Dispense:  60 tablet    Refill:  0  . levothyroxine (SYNTHROID, LEVOTHROID) 125 MCG tablet    Sig: TAKE 1 TABLET BY MOUTH DAILY    Dispense:  30 tablet    Refill:  0

## 2017-11-23 DIAGNOSIS — R7309 Other abnormal glucose: Secondary | ICD-10-CM | POA: Diagnosis not present

## 2017-11-23 DIAGNOSIS — I10 Essential (primary) hypertension: Secondary | ICD-10-CM | POA: Diagnosis not present

## 2017-11-23 DIAGNOSIS — E559 Vitamin D deficiency, unspecified: Secondary | ICD-10-CM | POA: Diagnosis not present

## 2017-11-23 DIAGNOSIS — R471 Dysarthria and anarthria: Secondary | ICD-10-CM | POA: Diagnosis not present

## 2017-11-23 DIAGNOSIS — E039 Hypothyroidism, unspecified: Secondary | ICD-10-CM | POA: Diagnosis not present

## 2017-11-23 DIAGNOSIS — E782 Mixed hyperlipidemia: Secondary | ICD-10-CM | POA: Diagnosis not present

## 2017-11-24 ENCOUNTER — Other Ambulatory Visit: Payer: Self-pay | Admitting: Internal Medicine

## 2017-11-24 DIAGNOSIS — R471 Dysarthria and anarthria: Secondary | ICD-10-CM

## 2018-01-04 ENCOUNTER — Ambulatory Visit
Admission: RE | Admit: 2018-01-04 | Discharge: 2018-01-04 | Disposition: A | Discharge status: Home / Self Care | Payer: Medicare Other | Source: Ambulatory Visit | Attending: Internal Medicine | Admitting: Internal Medicine

## 2018-01-04 DIAGNOSIS — R471 Dysarthria and anarthria: Secondary | ICD-10-CM | POA: Diagnosis not present

## 2018-01-04 MED ORDER — GADOBENATE DIMEGLUMINE 529 MG/ML IV SOLN
14.0000 mL | Freq: Once | INTRAVENOUS | Status: AC | PRN
  Administered 2018-01-04: 14 mL via INTRAVENOUS

## 2018-04-03 DIAGNOSIS — E890 Postprocedural hypothyroidism: Secondary | ICD-10-CM | POA: Diagnosis not present

## 2018-04-03 DIAGNOSIS — E782 Mixed hyperlipidemia: Secondary | ICD-10-CM | POA: Diagnosis not present

## 2018-04-03 DIAGNOSIS — I10 Essential (primary) hypertension: Secondary | ICD-10-CM | POA: Diagnosis not present

## 2018-04-04 DIAGNOSIS — E782 Mixed hyperlipidemia: Secondary | ICD-10-CM | POA: Diagnosis not present

## 2018-04-04 DIAGNOSIS — E890 Postprocedural hypothyroidism: Secondary | ICD-10-CM | POA: Diagnosis not present

## 2018-04-20 DIAGNOSIS — I679 Cerebrovascular disease, unspecified: Secondary | ICD-10-CM | POA: Diagnosis not present

## 2018-04-20 DIAGNOSIS — E890 Postprocedural hypothyroidism: Secondary | ICD-10-CM | POA: Diagnosis not present

## 2018-04-20 DIAGNOSIS — R413 Other amnesia: Secondary | ICD-10-CM | POA: Diagnosis not present

## 2018-04-20 DIAGNOSIS — E782 Mixed hyperlipidemia: Secondary | ICD-10-CM | POA: Diagnosis not present

## 2018-04-20 DIAGNOSIS — I10 Essential (primary) hypertension: Secondary | ICD-10-CM | POA: Diagnosis not present

## 2018-12-20 DIAGNOSIS — R413 Other amnesia: Secondary | ICD-10-CM | POA: Diagnosis present

## 2019-08-16 ENCOUNTER — Ambulatory Visit: Payer: Medicaid Other | Attending: Internal Medicine

## 2019-08-16 DIAGNOSIS — Z23 Encounter for immunization: Secondary | ICD-10-CM

## 2019-08-16 NOTE — Progress Notes (Signed)
   Covid-19 Vaccination Clinic  Name:  Agueda Winbush    MRN: [id number] DOB: 1951-02-05  08/16/2019  [name] was observed post Covid-19 immunization for 15 minutes without incident. She was provided with Vaccine Information Sheet and instruction to access the V-Safe system.   [name] was instructed to call 911 with any severe reactions post vaccine: Marland Kitchen Difficulty breathing  . Swelling of face and throat  . A fast heartbeat  . A bad rash all over body  . Dizziness and weakness

## 2019-09-11 ENCOUNTER — Ambulatory Visit: Payer: Medicaid Other | Attending: Internal Medicine

## 2019-09-11 DIAGNOSIS — Z23 Encounter for immunization: Secondary | ICD-10-CM

## 2019-09-11 NOTE — Progress Notes (Signed)
   Covid-19 Vaccination Clinic  Name:  Debra Huber    MRN: [id number] DOB: [birth year]  09/11/2019  [name] was observed post Covid-19 immunization for 15 minutes without incident. She was provided with Vaccine Information Sheet and instruction to access the V-Safe system.   [name] was instructed to call 911 with any severe reactions post vaccine: Marland Kitchen Difficulty breathing  . Swelling of face and throat  . A fast heartbeat  . A bad rash all over body  . Dizziness and weakness   Immunizations Administered    Name Date Dose VIS Date Route   Pfizer COVID-19 Vaccine 09/11/2019  3:34 PM 0.3 mL 05/25/2019 Intramuscular   Manufacturer: [place]   Lot: [id number]   Energy: [id number]

## 2019-10-05 ENCOUNTER — Other Ambulatory Visit: Payer: Self-pay | Admitting: Internal Medicine

## 2019-10-05 DIAGNOSIS — Z1231 Encounter for screening mammogram for malignant neoplasm of breast: Secondary | ICD-10-CM

## 2019-10-05 DIAGNOSIS — E2839 Other primary ovarian failure: Secondary | ICD-10-CM

## 2019-11-27 ENCOUNTER — Other Ambulatory Visit: Payer: Self-pay | Admitting: Internal Medicine

## 2019-11-27 DIAGNOSIS — I679 Cerebrovascular disease, unspecified: Secondary | ICD-10-CM

## 2019-12-04 ENCOUNTER — Ambulatory Visit
Admission: RE | Admit: 2019-12-04 | Discharge: 2019-12-04 | Disposition: A | Discharge status: Home / Self Care | Payer: Medicare Other | Source: Ambulatory Visit | Attending: Internal Medicine | Admitting: Internal Medicine

## 2019-12-04 DIAGNOSIS — I679 Cerebrovascular disease, unspecified: Secondary | ICD-10-CM

## 2019-12-18 ENCOUNTER — Other Ambulatory Visit: Payer: Medicare Other

## 2019-12-18 ENCOUNTER — Ambulatory Visit: Payer: Medicare Other

## 2021-08-04 ENCOUNTER — Emergency Department (HOSPITAL_COMMUNITY): Payer: Medicare Other

## 2021-08-04 ENCOUNTER — Other Ambulatory Visit: Payer: Self-pay

## 2021-08-04 ENCOUNTER — Observation Stay (HOSPITAL_COMMUNITY)
Admission: EM | Admit: 2021-08-04 | Discharge: 2021-08-05 | Disposition: A | Discharge status: Home Health | Payer: Medicare Other | Attending: Family Medicine | Admitting: Family Medicine

## 2021-08-04 DIAGNOSIS — N390 Urinary tract infection, site not specified: Secondary | ICD-10-CM | POA: Diagnosis not present

## 2021-08-04 DIAGNOSIS — I10 Essential (primary) hypertension: Secondary | ICD-10-CM | POA: Insufficient documentation

## 2021-08-04 DIAGNOSIS — R627 Adult failure to thrive: Secondary | ICD-10-CM | POA: Diagnosis not present

## 2021-08-04 DIAGNOSIS — R531 Weakness: Secondary | ICD-10-CM | POA: Diagnosis present

## 2021-08-04 DIAGNOSIS — E785 Hyperlipidemia, unspecified: Secondary | ICD-10-CM | POA: Diagnosis present

## 2021-08-04 DIAGNOSIS — Z20822 Contact with and (suspected) exposure to covid-19: Secondary | ICD-10-CM | POA: Insufficient documentation

## 2021-08-04 DIAGNOSIS — R9389 Abnormal findings on diagnostic imaging of other specified body structures: Secondary | ICD-10-CM

## 2021-08-04 DIAGNOSIS — I9589 Other hypotension: Secondary | ICD-10-CM | POA: Diagnosis not present

## 2021-08-04 DIAGNOSIS — N179 Acute kidney failure, unspecified: Secondary | ICD-10-CM | POA: Insufficient documentation

## 2021-08-04 DIAGNOSIS — E039 Hypothyroidism, unspecified: Secondary | ICD-10-CM | POA: Diagnosis not present

## 2021-08-04 DIAGNOSIS — I959 Hypotension, unspecified: Principal | ICD-10-CM | POA: Insufficient documentation

## 2021-08-04 DIAGNOSIS — F0390 Unspecified dementia without behavioral disturbance: Secondary | ICD-10-CM | POA: Diagnosis not present

## 2021-08-04 DIAGNOSIS — E860 Dehydration: Secondary | ICD-10-CM | POA: Diagnosis not present

## 2021-08-04 DIAGNOSIS — Z79899 Other long term (current) drug therapy: Secondary | ICD-10-CM | POA: Insufficient documentation

## 2021-08-04 DIAGNOSIS — R413 Other amnesia: Secondary | ICD-10-CM | POA: Diagnosis present

## 2021-08-04 DIAGNOSIS — C189 Malignant neoplasm of colon, unspecified: Secondary | ICD-10-CM | POA: Diagnosis present

## 2021-08-04 DIAGNOSIS — Z85850 Personal history of malignant neoplasm of thyroid: Secondary | ICD-10-CM | POA: Diagnosis not present

## 2021-08-04 DIAGNOSIS — E890 Postprocedural hypothyroidism: Secondary | ICD-10-CM | POA: Diagnosis present

## 2021-08-04 DIAGNOSIS — Z85038 Personal history of other malignant neoplasm of large intestine: Secondary | ICD-10-CM | POA: Diagnosis not present

## 2021-08-04 LAB — URINALYSIS, ROUTINE W REFLEX MICROSCOPIC
Bilirubin Urine: NEGATIVE
Glucose, UA: NEGATIVE mg/dL
Ketones, ur: 5 mg/dL — AB
Nitrite: NEGATIVE
Protein, ur: 30 mg/dL — AB
Specific Gravity, Urine: 1.013 (ref 1.005–1.030)
pH: 5 (ref 5.0–8.0)

## 2021-08-04 LAB — CREATININE, URINE, RANDOM: Creatinine, Urine: 128.6 mg/dL

## 2021-08-04 LAB — RAPID URINE DRUG SCREEN, HOSP PERFORMED
Amphetamines: NOT DETECTED
Barbiturates: NOT DETECTED
Benzodiazepines: NOT DETECTED
Cocaine: NOT DETECTED
Opiates: NOT DETECTED
Tetrahydrocannabinol: NOT DETECTED

## 2021-08-04 LAB — COMPREHENSIVE METABOLIC PANEL
ALT: 31 U/L (ref 0–44)
AST: 44 U/L — ABNORMAL HIGH (ref 15–41)
Albumin: 3.6 g/dL (ref 3.5–5.0)
Alkaline Phosphatase: 72 U/L (ref 38–126)
Anion gap: 14 (ref 5–15)
BUN: 40 mg/dL — ABNORMAL HIGH (ref 8–23)
CO2: 23 mmol/L (ref 22–32)
Calcium: 8.4 mg/dL — ABNORMAL LOW (ref 8.9–10.3)
Chloride: 97 mmol/L — ABNORMAL LOW (ref 98–111)
Creatinine, Ser: 1.6 mg/dL — ABNORMAL HIGH (ref 0.44–1.00)
GFR, Estimated: 35 mL/min — ABNORMAL LOW (ref >60)
Glucose, Bld: 119 mg/dL — ABNORMAL HIGH (ref 70–99)
Potassium: 4.1 mmol/L (ref 3.5–5.1)
Sodium: 134 mmol/L — ABNORMAL LOW (ref 135–145)
Total Bilirubin: 0.6 mg/dL (ref 0.3–1.2)
Total Protein: 7.2 g/dL (ref 6.5–8.1)

## 2021-08-04 LAB — CBC WITH DIFFERENTIAL/PLATELET
Abs Immature Granulocytes: 0.04 10*3/uL (ref 0.00–0.07)
Basophils Absolute: 0 10*3/uL (ref 0.0–0.1)
Basophils Relative: 0 %
Eosinophils Absolute: 0 10*3/uL (ref 0.0–0.5)
Eosinophils Relative: 0 %
HCT: 42.8 % (ref 36.0–46.0)
Hemoglobin: 13.9 g/dL (ref 12.0–15.0)
Immature Granulocytes: 1 %
Lymphocytes Relative: 18 %
Lymphs Abs: 1.1 10*3/uL (ref 0.7–4.0)
MCH: 26.8 pg (ref 26.0–34.0)
MCHC: 32.5 g/dL (ref 30.0–36.0)
MCV: 82.5 fL (ref 80.0–100.0)
Monocytes Absolute: 0.4 10*3/uL (ref 0.1–1.0)
Monocytes Relative: 7 %
Neutro Abs: 4.4 10*3/uL (ref 1.7–7.7)
Neutrophils Relative %: 74 %
Platelets: 127 10*3/uL — ABNORMAL LOW (ref 150–400)
RBC: 5.19 MIL/uL — ABNORMAL HIGH (ref 3.87–5.11)
RDW: 13.9 % (ref 11.5–15.5)
WBC: 6 10*3/uL (ref 4.0–10.5)
nRBC: 0 % (ref 0.0–0.2)

## 2021-08-04 LAB — RESP PANEL BY RT-PCR (FLU A&B, COVID) ARPGX2
Influenza A by PCR: NEGATIVE
Influenza B by PCR: NEGATIVE
SARS Coronavirus 2 by RT PCR: NEGATIVE

## 2021-08-04 LAB — LACTIC ACID, PLASMA: Lactic Acid, Venous: 2.3 mmol/L (ref 0.5–1.9)

## 2021-08-04 LAB — TROPONIN I (HIGH SENSITIVITY)
Troponin I (High Sensitivity): 8 ng/L (ref <18)
Troponin I (High Sensitivity): 9 ng/L (ref <18)

## 2021-08-04 LAB — T4, FREE: Free T4: 1.83 ng/dL — ABNORMAL HIGH (ref 0.61–1.12)

## 2021-08-04 LAB — TSH: TSH: 0.059 u[IU]/mL — ABNORMAL LOW (ref 0.350–4.500)

## 2021-08-04 LAB — SODIUM, URINE, RANDOM: Sodium, Ur: 32 mmol/L

## 2021-08-04 MED ORDER — LACTATED RINGERS IV BOLUS
1000.0000 mL | Freq: Once | INTRAVENOUS | Status: AC
  Administered 2021-08-04: 1000 mL via INTRAVENOUS

## 2021-08-04 MED ORDER — ENOXAPARIN SODIUM 40 MG/0.4ML IJ SOSY
40.0000 mg | PREFILLED_SYRINGE | INTRAMUSCULAR | Status: DC
  Administered 2021-08-04: 40 mg via SUBCUTANEOUS
  Filled 2021-08-04: qty 0.4

## 2021-08-04 MED ORDER — SODIUM CHLORIDE 0.9 % IV SOLN
1.0000 g | INTRAVENOUS | Status: DC
  Administered 2021-08-04: 1 g via INTRAVENOUS
  Filled 2021-08-04: qty 10

## 2021-08-04 MED ORDER — ACETAMINOPHEN 650 MG RE SUPP: 650.0000 mg | Freq: Four times a day (QID) | RECTAL | Status: DC | PRN

## 2021-08-04 MED ORDER — ACETAMINOPHEN 325 MG PO TABS: 650.0000 mg | ORAL_TABLET | Freq: Four times a day (QID) | ORAL | Status: DC | PRN

## 2021-08-04 MED ORDER — AMITRIPTYLINE HCL 25 MG PO TABS
25.0000 mg | ORAL_TABLET | Freq: Every day | ORAL | Status: DC
  Administered 2021-08-04: 25 mg via ORAL
  Filled 2021-08-04 (×2): qty 1

## 2021-08-04 MED ORDER — LACTATED RINGERS IV SOLN: INTRAVENOUS | Status: DC

## 2021-08-04 MED ORDER — LEVOTHYROXINE SODIUM 112 MCG PO TABS
112.0000 ug | ORAL_TABLET | Freq: Every day | ORAL | Status: DC
  Administered 2021-08-05: 112 ug via ORAL
  Filled 2021-08-04: qty 1

## 2021-08-04 NOTE — Assessment & Plan Note (Signed)
Hold home medication for today with presenting bp of 70/57 On lisinopril, hctz, novasc and clonidine Would taper off clonidine if this will be stopped long term

## 2021-08-04 NOTE — Assessment & Plan Note (Signed)
Creatinine normal at baseline Likely prerenal in setting of FTT/poor PO intake and hypotension UA pending/urine studies  Continue IVF Hold nephrotoxic drugs (lisionpril, hctz) Strict I/O Trend bmp

## 2021-08-04 NOTE — ED Notes (Signed)
Son Shanon Brow and Daughter in [place] [phone] would like an update

## 2021-08-04 NOTE — Assessment & Plan Note (Signed)
UA suspicious for UTI and could be contributing to her complaints Culture ordered Started on rocephin (has had cephalosporin in the past per pharmacy) Initial lactic acid elevated at 2.3, likely more from hypovolemia/dehydration, repeat pending Blood cultures ordered

## 2021-08-04 NOTE — Assessment & Plan Note (Addendum)
S/p hemicolectomy and chemo in 2012 Endorses no stomach pain, BM change, blood in stool

## 2021-08-04 NOTE — ED Notes (Signed)
RN entering room pt standing at bedside with stool on floor stating she had to go but unable to make it to [facility].   Pt linen, gown, and pericare

## 2021-08-04 NOTE — ED Notes (Signed)
Pt refuses lab draw. IV flushes but will not draw back for labs. Pt states that she is scared of needles and doesn't want to be stuck.

## 2021-08-04 NOTE — ED Provider Notes (Addendum)
[facility] EMERGENCY DEPARTMENT Provider Note   CSN: [id number] Arrival date & time: 08/04/21  1227     History  Chief Complaint  Patient presents with   Weakness    Debra Huber is a 71 y.o. female.  Patient is a 71 year old female with a history of hypothyroidism, hypertension, mild dementia who lives at home with her husband and is presenting today by private vehicle after being seen by her physician and sent here.  Husband reports that for now 2 weeks the patient has refused to eat.  Patient reports she has no pain or nausea but she just does not feel hungry and she does not feel like eating.  This has been persistent she will drink some fluids but her husband states that now she has become so weak she is unable to even stand and walk to the bathroom on her own.  She continues to take her medications that are prescribed.  He reports that she has multiple options for food and until 2 weeks ago she was eating without difficulty and now just refuses.  She denies any shortness of breath, chest pain, abdominal pain.  She just reports feeling generally weak.  Her husband reports that the doctor's office today patient was hypotensive and they sent her here for further evaluation.  She has taken all of her blood pressure medications this morning.  Last bowel movement was 2 or 3 days ago.  Her husband reports that every time he takes her to the bathroom there is only urine and he has not seen a bowel movement.  The history is provided by the patient, the spouse and medical records.  Weakness     Home Medications Prior to Admission medications   Medication Sig Start Date End Date Taking? Authorizing Provider  amitriptyline (ELAVIL) 25 MG tablet Take 1 tablet (25 mg total) by mouth at bedtime. 09/18/15   [name], MD  amLODipine (NORVASC) 5 MG tablet Take 1 tablet (5 mg total) by mouth daily. 09/18/15   [name], MD  cloNIDine (CATAPRES) 0.2 MG tablet TAKE  1 TABLET BY MOUTH 2 TIMES DAILY 10/01/16   Harrison Mons, PA  Coenzyme Q10-Fish Oil-Vit E (CO-Q 10 OMEGA-3 FISH OIL PO) Take by mouth.    [provider]  hydrochlorothiazide (HYDRODIURIL) 25 MG tablet Take 1 tablet (25 mg total) by mouth daily with breakfast. 09/18/15   [name], MD  levothyroxine (SYNTHROID, LEVOTHROID) 125 MCG tablet TAKE 1 TABLET BY MOUTH DAILY 10/01/16   Harrison Mons, PA  lisinopril (PRINIVIL,ZESTRIL) 40 MG tablet Take 1 tablet (40 mg total) by mouth daily. 09/18/15   [name], MD  Multiple Vitamins-Minerals (CENTRUM SILVER) CHEW Chew 1 tablet by mouth daily.     [provider]  OVER THE COUNTER MEDICATION "Vitamins a-e"    [provider]  vitamin E (VITAMIN E) 1000 UNIT capsule Take 1,000 Units by mouth daily.    [provider]      Allergies    Penicillins    Review of Systems   Review of Systems  Neurological:  Positive for weakness.   Physical Exam Updated Vital Signs BP (!) 116/92    Pulse (!) 50    Temp (!) 97.5 F (36.4 C) (Oral)    Resp 14    Ht 5\' 7"  (1.702 m)    Wt 63 kg    SpO2 97%    BMI 21.75 kg/m  Physical Exam Vitals and  nursing note reviewed.  Constitutional:      General: She is not in acute distress.    Appearance: She is well-developed. She is ill-appearing.  HENT:     Head: Normocephalic and atraumatic.     Right Ear: Tympanic membrane normal.     Left Ear: Tympanic membrane normal.     Nose: Nose normal.     Mouth/Throat:     Mouth: Mucous membranes are dry.  Eyes:     Extraocular Movements: Extraocular movements intact.     Conjunctiva/sclera: Conjunctivae normal.     Pupils: Pupils are equal, round, and reactive to light.  Cardiovascular:     Rate and Rhythm: Regular rhythm. Bradycardia present.     Heart sounds: Normal heart sounds. No murmur heard.   No friction rub.  Pulmonary:     Effort: Pulmonary effort is normal.     Breath sounds: Normal breath sounds. No wheezing or  rales.  Abdominal:     General: Bowel sounds are normal. There is no distension.     Palpations: Abdomen is soft.     Tenderness: There is no abdominal tenderness. There is no guarding or rebound.  Musculoskeletal:        General: No tenderness. Normal range of motion.     Cervical back: Normal range of motion and neck supple.     Comments: No edema  Skin:    General: Skin is warm and dry.     Coloration: Skin is pale.     Findings: No rash.  Neurological:     Mental Status: She is alert and oriented to person, place, and time.     Cranial Nerves: No cranial nerve deficit.     Sensory: No sensory deficit.     Comments: General weakness.  Seems like it takes maximum effort to lift her legs or arms off the bed and can only hold for 20sec till she is panting and puts them down.  Sensation intact.  No visual field cuts  Psychiatric:     Comments: Awake and alert.  Calm and cooperative.  Does not appear to be reacting to internal stimuli.    ED Results / Procedures / Treatments   Labs (all labs ordered are listed, but only abnormal results are displayed) Labs Reviewed  CBC WITH DIFFERENTIAL/PLATELET - Abnormal; Notable for the following components:      Result Value   RBC 5.19 (*)    Platelets 127 (*)    All other components within normal limits  COMPREHENSIVE METABOLIC PANEL - Abnormal; Notable for the following components:   Sodium 134 (*)    Chloride 97 (*)    Glucose, Bld 119 (*)    BUN 40 (*)    Creatinine, Ser 1.60 (*)    Calcium 8.4 (*)    AST 44 (*)    GFR, Estimated 35 (*)    All other components within normal limits  LACTIC ACID, PLASMA - Abnormal; Notable for the following components:   Lactic Acid, Venous 2.3 (*)    All other components within normal limits  TSH - Abnormal; Notable for the following components:   TSH 0.059 (*)    All other components within normal limits  RESP PANEL BY RT-PCR (FLU A&B, COVID) ARPGX2  URINALYSIS, ROUTINE W REFLEX MICROSCOPIC   T3, FREE  T4, FREE  TROPONIN I (HIGH SENSITIVITY)  TROPONIN I (HIGH SENSITIVITY)    EKG EKG Interpretation  Date/Time:  Tuesday August 04 2021 13:21:48 EST Ventricular Rate:  73 PR Interval:  132 QRS Duration: 96 QT Interval:  394 QTC Calculation: 434 R Axis:   61 Text Interpretation: Normal sinus rhythm Normal ECG When compared with ECG of 04-Aug-2021 13:20, PREVIOUS ECG IS PRESENT No significant change since last tracing Confirmed by Blanchie Dessert [phone]) on 08/04/2021 1:40:18 PM  Radiology CT Head Wo Contrast  Result Date: 08/04/2021 CLINICAL DATA:  Mental status change, unknown cause EXAM: CT HEAD WITHOUT CONTRAST TECHNIQUE: Contiguous axial images were obtained from the base of the skull through the vertex without intravenous contrast. RADIATION DOSE REDUCTION: This exam was performed according to the departmental dose-optimization program which includes automated exposure control, adjustment of the mA and/or kV according to patient size and/or use of iterative reconstruction technique. COMPARISON:  01/04/2018 FINDINGS: Brain: No evidence of acute infarction, hemorrhage, hydrocephalus, extra-axial collection or mass lesion/mass effect. Chronic bilateral basal ganglia lacunar infarcts. Moderate low-density changes within the periventricular and subcortical white matter compatible with chronic microvascular ischemic change. Mild diffuse cerebral volume loss. Vascular: Atherosclerotic calcifications involving the large vessels of the skull base. No unexpected hyperdense vessel. Skull: Normal. Negative for fracture or focal lesion. Sinuses/Orbits: No acute finding. Other: None. IMPRESSION: 1. No acute intracranial findings. 2. Chronic microvascular ischemic change and cerebral volume loss. Electronically Signed   By: Davina Poke D.O.   On: 08/04/2021 14:09   DG Chest Port 1 View  Result Date: 08/04/2021 CLINICAL DATA:  Weakness EXAM: PORTABLE CHEST 1 VIEW COMPARISON:  April of  2015 chest CT FINDINGS: EKG leads project over the chest. A linear metallic density extends from the upper margin of the radiograph projects over the thoracic inlet. A similar appearing structure is seen along the LEFT lateral neck. A necklace can be seen on the scout radiograph for the patient's recent CT of the head. Cardiomediastinal contours and hilar structures are unremarkable. No lobar consolidation. No sign of pleural effusion. Subtle increased interstitial prominence at the lung bases bilaterally. No visible pneumothorax. On limited assessment there is no acute skeletal process. IMPRESSION: 1. Subtle increased interstitial prominence at the lung bases bilaterally, may represent mild edema or atypical infection. 2. No lobar consolidation or pleural effusion. 3. Metallic structure projecting over the thoracic inlet just along the LEFT border of the spine may be a part of the patient's necklace. Correlate with any indwelling devices such as temperature probe in the esophagus. Electronically Signed   By: Zetta Bills M.D.   On: 08/04/2021 14:29    Procedures Procedures    Medications Ordered in ED Medications  lactated ringers infusion (has no administration in time range)  lactated ringers bolus 1,000 mL (1,000 mLs Intravenous New Bag/Given 08/04/21 1349)  lactated ringers bolus 1,000 mL (1,000 mLs Intravenous New Bag/Given 08/04/21 1349)    ED Course/ Medical Decision Making/ A&P                           Medical Decision Making Amount and/or Complexity of Data Reviewed Independent Historian: spouse External Data Reviewed: labs and notes. Labs: ordered. Decision-making details documented in ED Course. Radiology: ordered and independent interpretation performed. Decision-making details documented in ED Course. ECG/medicine tests: ordered and independent interpretation performed. Decision-making details documented in ED Course.  Risk Prescription drug management. Decision regarding  hospitalization.   Patient is an elderly patient presenting today with poor oral intake for the last 2 weeks, hypotension and generalized weakness.  Patient's hypotension is most likely related to dehydration and ongoing compliance  with her blood pressure medications.  She takes amlodipine, clonidine, hydrochlorothiazide, lisinopril at home and had all of these medications today.  She was seen at her PCPs prior to this based on evaluation of external medical records and was sent here for hypotension.  Initially upon arrival here patient is just generally weak but awake and alert.  She is in no acute distress at this time but even minimal activity appears to cause patient to become short of breath and require her maximum effort.  Initial blood pressure was 70 systolic but repeat was 75/64.  Patient has no abdominal pain or symptoms to suggest an acute abdominal process.  We will ensure no evidence of UTI, atypical cardiac cause for her symptoms.  We will also do a CT to ensure no new stroke and COVID swabs.  Also will ensure her thyroid levels are within range.  On the monitor patient is bradycardic and sats are 100% on room air.  She remained on continuous monitoring.  IV fluids initiated.  Labs and imaging are pending.  3:20 PM I independently interpreted patient's labs and EKG. Her CBC within normal limits today, CMP with new AKI today with creatinine of 1.6 from her baseline of 0.6 and elevated BUN.  Normal potassium and sodium is normal at 134.  Troponin is within normal limits.  EKG without acute findings.  I independently reviewed and interpreted patient's chest x-ray which shows no acute findings.  Radiology did report subtle increased interstitial prominences at the lung bases which could represent edema.  No lobar consolidations.  Patient's head CT showed no acute findings.  3:20 PM TSH today is low at 0.059.  T3 and T4 are pending.  COVID is negative.  Findings discussed with the patient and her  husband.  Based on above findings, poor oral intake, generalized weakness will admit for further care.  Urine is still pending.        Final Clinical Impression(s) / ED Diagnoses Final diagnoses:  Weakness  Dehydration  AKI (acute kidney injury) [facility])    Rx / DC Orders ED Discharge Orders     None         Blanchie Dessert, MD 08/04/21 1516    Blanchie Dessert, MD 08/04/21 1520

## 2021-08-04 NOTE — Assessment & Plan Note (Addendum)
Seems to be worse than baseline-likely secondary to UTI Discussed with significant other would recommend neurology f/u Check RPR/B12, adjust thyroid medication  Delirium precautions

## 2021-08-04 NOTE — Assessment & Plan Note (Signed)
71 year old presenting from PCP with hypotension, weakness, FTT -observation to telemetry -likely secondary to poor PO intake/bp meds -UA suspicious for UTI; however, she has no criteria for sepsis. Lactic acid initially elevated, repeat pending. Likely more from hypovolemia -received 2L bolus in ED with good response and bp now soft/normotensive -continue IVF overnight, treat UTI -hold blood pressure medication (norvasc, clonidine, lisinopril, hctz)

## 2021-08-04 NOTE — Assessment & Plan Note (Addendum)
-  memory loss with overall decline in 2 weeks -has stopped eating and drinking -? more confusion from baseline -more lethary/weakness -nutrition/PT/OT consult -UA suspicious for UTI, culture ordered and treating

## 2021-08-04 NOTE — ED Triage Notes (Signed)
Pt arrived POV with c/c of weakness. Per family pt "sleeps all the time, hasn't been eating, lost 25 lbs within 2 weeks".

## 2021-08-04 NOTE — Assessment & Plan Note (Signed)
TSH low, could be contributing to symptoms Will decrease dose of medication from 125 to 145mcg  Repeat labs in 6-8 weeks

## 2021-08-04 NOTE — H&P (Signed)
History and Physical    Patient: Debra Huber TIW:[id number] DOB: 08/29/1950 DOA: 08/04/2021 DOS: the patient was seen and examined on 08/04/2021 PCP: Patient, No Pcp Per (Inactive)  Patient coming from:  doctors office   - lives with her boyfriend    Chief Complaint: weakness/poor PO intake   HPI: Debra Huber is a 71 y.o. female with medical history significant of HTN, hypothyroidism, hx of colon cancer in 2012 s/p hemicolectomy and chemo, memory loss who presented to ED with complaints of weakness and hypotension from her PCP. She doesn't know why she is here. She states she doesn't eat because she is just not hungry. She states she sleeps a lot at home.   I talked to her significant other, Debra Huber, who states she was seen at her PCP office and had significant hypotension and weight loss and her PCP asked them to go to ED. She stopped eating about 2 weeks ago. He also reported she has been sleeping a lot over the past 2 weeks.  Before this she had decreased PO intake, but nothing like the last 2 weeks. She also had a lot of energy before 2 weeks ago.  He has to help remind her to eat. No falls or trauma,  no significant life events.   He states she has not been sick recently, no fever/chills. She has not complained of anything and tells me she feels fine. Denies any headaches, chest pain or palpitations, shortness of breath or cough, abdominal pain, N/V/D, change in BM, dysuria or leg swelling.    ER Course:  vitals: afebrile, bp: 70/57>88/64>118/75, HR: 63, RR: 18, oxygen: 100% on room air Pertinent labs: platelets 127, BUN: 40, creatinine: 1.60, lactic acid: 2.3, TSH: .0059,  CT head: no acute finding CXR: Subtle increased interstitial prominence at the lung bases bilaterally, may represent mild edema or atypical infection. 2. No lobar consolidation or pleural effusion. In ED: given 2L bolus and started on maintenance IVF. Thyroid labs ordered.    Review of  Systems: As mentioned in the history of present illness. All other systems reviewed and are negative. Past Medical History:  Diagnosis Date   Anemia    Blood transfusion without reported diagnosis    Colon cancer ([place]) dx'd 11/2009   S/P HEMI-COLECTOMY AND FINISHED CHEMO 11/24/10 -DR. ODOGWU   Complication of anesthesia    ALWAYS FEELS VERY SLEEPY WAKING UP   Hypertension    Hyperthyroidism    GRAVES DISEASE   Thyroid disease    Past Surgical History:  Procedure Laterality Date   COLECTOMY     partial   HEMATOMA EVACUATION  02/26/2012   Procedure: EVACUATION HEMATOMA;  Surgeon: Pedro Earls, MD;  Location: [facility];  Service: General;  Laterality: N/A;  irrigation and drainage of hematoma and placement of drain.   NEEDLE BX THYROID     PORT-A-CATH REMOVAL  11/04/11   THYROIDECTOMY  02/25/2012   Procedure: THYROIDECTOMY;  Surgeon: Pedro Earls, MD;  Location: [facility];  Service: General;;   Social History:  reports that she has never smoked. She has never used smokeless tobacco. She reports that she does not drink alcohol and does not use drugs.  Allergies  Allergen Reactions   Penicillins Swelling    Did PCN reaction causing immediate rash, facial/tongue/throat swelling, SOB or lightheadedness with hypotension: Yes Did PCN reaction causing severe rash involving mucus membranes or skin necrosis: unknown Did PCN reaction that required hospitalization Yes- was in the hospital and  i was given penicillin and had a reaction Did PCN reaction occurring within the last 10 years: No, about 15 years or more If all of the above answers are "NO", then may proceed with Cephalosporin use.     Family History  Problem Relation Age of Onset   Cancer Father        prostat   Hypertension Sister    Thyroid disease Sister    Hypertension Brother    Hypertension Sister    Thyroid disease Sister    Hypertension Sister    Hypertension Sister    Hypertension Sister    Hypertension Sister     Hypertension Brother     Prior to Admission medications   Medication Sig Start Date End Date Taking? Authorizing Provider  amitriptyline (ELAVIL) 25 MG tablet Take 1 tablet (25 mg total) by mouth at bedtime. 09/18/15   [name], MD  amLODipine (NORVASC) 5 MG tablet Take 1 tablet (5 mg total) by mouth daily. 09/18/15   [name], MD  cloNIDine (CATAPRES) 0.2 MG tablet TAKE 1 TABLET BY MOUTH 2 TIMES DAILY 10/01/16   Harrison Mons, PA  Coenzyme Q10-Fish Oil-Vit E (CO-Q 10 OMEGA-3 FISH OIL PO) Take by mouth.    [provider]  hydrochlorothiazide (HYDRODIURIL) 25 MG tablet Take 1 tablet (25 mg total) by mouth daily with breakfast. 09/18/15   [name], MD  levothyroxine (SYNTHROID, LEVOTHROID) 125 MCG tablet TAKE 1 TABLET BY MOUTH DAILY 10/01/16   Harrison Mons, PA  lisinopril (PRINIVIL,ZESTRIL) 40 MG tablet Take 1 tablet (40 mg total) by mouth daily. 09/18/15   [name], MD  Multiple Vitamins-Minerals (CENTRUM SILVER) CHEW Chew 1 tablet by mouth daily.     [provider]  OVER THE COUNTER MEDICATION "Vitamins a-e"    [provider]  vitamin E (VITAMIN E) 1000 UNIT capsule Take 1,000 Units by mouth daily.    [provider]    Physical Exam: Vitals:   08/04/21 1445 08/04/21 1500 08/04/21 1515 08/04/21 1530  BP: 110/79 119/68 113/74 105/67  Pulse: (!) 57 (!) 55 (!) 57 (!) 55  Resp: 16 15 18 15   Temp:      TempSrc:      SpO2: 96% 97% 98% 98%  Weight:      Height:       General:  Appears calm and comfortable and is in NAD Eyes:  PERRL, EOMI, normal lids, iris ENT:  grossly normal hearing, lips & tongue, mmm; appropriate dentition Neck:  no LAD, masses or thyromegaly; no carotid bruits Cardiovascular:  RRR, no m/r/g. No LE edema.  Respiratory:   CTA bilaterally with no wheezes/rales/rhonchi.  Normal respiratory effort. Abdomen:  soft, NT, ND, NABS Back:   normal alignment, no CVAT Skin:  no rash or induration seen on limited  exam Musculoskeletal:  grossly normal tone BUE/BLE, good ROM, no bony abnormality Lower extremity:  No LE edema.  Limited foot exam with no ulcerations.  2+ distal pulses. Psychiatric:  grossly normal mood and affect, speech fluent and appropriate, Alert to self, knows she is in hospital, not name. [place] and state. Does not know date or year.  Neurologic:  CN 2-12 grossly intact, moves all extremities in coordinated fashion, sensation intact   Radiological Exams on Admission: Independently reviewed - see discussion in A/P where applicable  CT Head Wo Contrast  Result Date: 08/04/2021 CLINICAL DATA:  Mental status change, unknown cause EXAM: CT HEAD WITHOUT CONTRAST TECHNIQUE: Contiguous  axial images were obtained from the base of the skull through the vertex without intravenous contrast. RADIATION DOSE REDUCTION: This exam was performed according to the departmental dose-optimization program which includes automated exposure control, adjustment of the mA and/or kV according to patient size and/or use of iterative reconstruction technique. COMPARISON:  01/04/2018 FINDINGS: Brain: No evidence of acute infarction, hemorrhage, hydrocephalus, extra-axial collection or mass lesion/mass effect. Chronic bilateral basal ganglia lacunar infarcts. Moderate low-density changes within the periventricular and subcortical white matter compatible with chronic microvascular ischemic change. Mild diffuse cerebral volume loss. Vascular: Atherosclerotic calcifications involving the large vessels of the skull base. No unexpected hyperdense vessel. Skull: Normal. Negative for fracture or focal lesion. Sinuses/Orbits: No acute finding. Other: None. IMPRESSION: 1. No acute intracranial findings. 2. Chronic microvascular ischemic change and cerebral volume loss. Electronically Signed   By: Davina Poke D.O.   On: 08/04/2021 14:09   DG Chest Port 1 View  Result Date: 08/04/2021 CLINICAL DATA:  Weakness EXAM: PORTABLE  CHEST 1 VIEW COMPARISON:  April of 2015 chest CT FINDINGS: EKG leads project over the chest. A linear metallic density extends from the upper margin of the radiograph projects over the thoracic inlet. A similar appearing structure is seen along the LEFT lateral neck. A necklace can be seen on the scout radiograph for the patient's recent CT of the head. Cardiomediastinal contours and hilar structures are unremarkable. No lobar consolidation. No sign of pleural effusion. Subtle increased interstitial prominence at the lung bases bilaterally. No visible pneumothorax. On limited assessment there is no acute skeletal process. IMPRESSION: 1. Subtle increased interstitial prominence at the lung bases bilaterally, may represent mild edema or atypical infection. 2. No lobar consolidation or pleural effusion. 3. Metallic structure projecting over the thoracic inlet just along the LEFT border of the spine may be a part of the patient's necklace. Correlate with any indwelling devices such as temperature probe in the esophagus. Electronically Signed   By: Zetta Bills M.D.   On: 08/04/2021 14:29    EKG: Independently reviewed.  NSR with rate 73; nonspecific ST changes with no evidence of acute ischemia   Labs on Admission: I have personally reviewed the available labs and imaging studies at the time of the admission.  Pertinent labs:   platelets 127,  BUN: 40,  creatinine: 1.60,  lactic acid: 2.3,  TSH: .0059, free T4: 1.83  Assessment and Plan: * Hypotension 71 year old presenting from PCP with hypotension, weakness, FTT -observation to telemetry -likely secondary to poor PO intake/bp meds -UA suspicious for UTI; however, she has no criteria for sepsis. Lactic acid initially elevated, repeat pending. Likely more from hypovolemia -received 2L bolus in ED with good response and bp now soft/normotensive -continue IVF overnight, treat UTI -hold blood pressure medication (norvasc, clonidine, lisinopril,  hctz)   UTI (urinary tract infection) UA suspicious for UTI and could be contributing to her complaints Culture ordered Started on rocephin (has had cephalosporin in the past per pharmacy) Initial lactic acid elevated at 2.3, likely more from hypovolemia/dehydration, repeat pending Blood cultures ordered   AKI (acute kidney injury) ([place]) Creatinine normal at baseline Likely prerenal in setting of FTT/poor PO intake and hypotension UA pending/urine studies  Continue IVF Hold nephrotoxic drugs (lisionpril, hctz) Strict I/O Trend bmp   Failure to thrive in adult- (present on admission) -memory loss with overall decline in 2 weeks -has stopped eating and drinking -? more confusion from baseline -more lethary/weakness -nutrition/PT/OT consult -UA suspicious for UTI, culture ordered  and treating   Hypertension- (present on admission) Hold home medication for today with presenting bp of 70/57 On lisinopril, hctz, novasc and clonidine Would taper off clonidine if this will be stopped long term    thyroid cancer s/p excision - (present on admission) TSH low, could be contributing to symptoms Will decrease dose of medication from 125 to 134mcg  Repeat labs in 6-8 weeks   Malignant neoplasm of colon ([place])- (present on admission) S/p hemicolectomy and chemo in 2012 Endorses no stomach pain, BM change, blood in stool    Memory impairment- (present on admission) Seems to be worse than baseline-likely secondary to UTI Discussed with significant other would recommend neurology f/u Check RPR/B12, adjust thyroid medication  Delirium precautions      Advance Care Planning:   Code Status: Full Code   Consults: nutrition/PT/OT   DVT Prophylaxis: lovenox  Family Communication: called her significant other: Debra [phone]  Severity of Illness: The appropriate patient status for this patient is OBSERVATION. Observation status is judged to be reasonable and necessary in  order to provide the required intensity of service to ensure the patient's safety. The patient's presenting symptoms, physical exam findings, and initial radiographic and laboratory data in the context of their medical condition is felt to place them at decreased risk for further clinical deterioration. Furthermore, it is anticipated that the patient will be medically stable for discharge from the hospital within 2 midnights of admission.   Author: Orma Flaming, MD 08/04/2021 7:59 PM  For on call review [email or web address].

## 2021-08-05 ENCOUNTER — Observation Stay (HOSPITAL_COMMUNITY): Payer: Medicare Other

## 2021-08-05 ENCOUNTER — Other Ambulatory Visit (HOSPITAL_COMMUNITY): Payer: Self-pay

## 2021-08-05 DIAGNOSIS — I9589 Other hypotension: Secondary | ICD-10-CM

## 2021-08-05 DIAGNOSIS — I959 Hypotension, unspecified: Secondary | ICD-10-CM | POA: Diagnosis not present

## 2021-08-05 LAB — VITAMIN B12: Vitamin B-12: 463 pg/mL (ref 180–914)

## 2021-08-05 LAB — COMPREHENSIVE METABOLIC PANEL
ALT: 25 U/L (ref 0–44)
AST: 36 U/L (ref 15–41)
Albumin: 3 g/dL — ABNORMAL LOW (ref 3.5–5.0)
Alkaline Phosphatase: 60 U/L (ref 38–126)
Anion gap: 12 (ref 5–15)
BUN: 28 mg/dL — ABNORMAL HIGH (ref 8–23)
CO2: 23 mmol/L (ref 22–32)
Calcium: 8.1 mg/dL — ABNORMAL LOW (ref 8.9–10.3)
Chloride: 100 mmol/L (ref 98–111)
Creatinine, Ser: 0.95 mg/dL (ref 0.44–1.00)
GFR, Estimated: >60 mL/min (ref >60)
Glucose, Bld: 79 mg/dL (ref 70–99)
Potassium: 2.8 mmol/L — ABNORMAL LOW (ref 3.5–5.1)
Sodium: 135 mmol/L (ref 135–145)
Total Bilirubin: 0.5 mg/dL (ref 0.3–1.2)
Total Protein: 6.1 g/dL — ABNORMAL LOW (ref 6.5–8.1)

## 2021-08-05 LAB — CBC
HCT: 39.6 % (ref 36.0–46.0)
Hemoglobin: 12.9 g/dL (ref 12.0–15.0)
MCH: 26.7 pg (ref 26.0–34.0)
MCHC: 32.6 g/dL (ref 30.0–36.0)
MCV: 81.8 fL (ref 80.0–100.0)
Platelets: 115 10*3/uL — ABNORMAL LOW (ref 150–400)
RBC: 4.84 MIL/uL (ref 3.87–5.11)
RDW: 13.6 % (ref 11.5–15.5)
WBC: 3.8 10*3/uL — ABNORMAL LOW (ref 4.0–10.5)
nRBC: 0 % (ref 0.0–0.2)

## 2021-08-05 LAB — LACTIC ACID, PLASMA
Lactic Acid, Venous: 1.5 mmol/L (ref 0.5–1.9)
Lactic Acid, Venous: 2.1 mmol/L (ref 0.5–1.9)
Lactic Acid, Venous: 2.1 mmol/L (ref 0.5–1.9)

## 2021-08-05 LAB — T3, FREE: T3, Free: 6 pg/mL — ABNORMAL HIGH (ref 2.0–4.4)

## 2021-08-05 LAB — RPR: RPR Ser Ql: NONREACTIVE

## 2021-08-05 LAB — HIV ANTIBODY (ROUTINE TESTING W REFLEX): HIV Screen 4th Generation wRfx: NONREACTIVE

## 2021-08-05 MED ORDER — POTASSIUM CHLORIDE CRYS ER 20 MEQ PO TBCR
40.0000 meq | EXTENDED_RELEASE_TABLET | ORAL | Status: AC
  Administered 2021-08-05 (×2): 40 meq via ORAL
  Filled 2021-08-05 (×2): qty 2

## 2021-08-05 MED ORDER — POTASSIUM CHLORIDE IN NACL 20-0.9 MEQ/L-% IV SOLN
INTRAVENOUS | Status: DC
Start: 2021-08-05 — End: 2021-08-05
  Filled 2021-08-05 (×2): qty 1000

## 2021-08-05 MED ORDER — CLONIDINE HCL 0.1 MG PO TABS
0.1000 mg | ORAL_TABLET | Freq: Two times a day (BID) | ORAL | 0 refills | Status: AC
  Filled 2021-08-05: qty 60, 30d supply, fill #0

## 2021-08-05 MED ORDER — NITROFURANTOIN MONOHYD MACRO 100 MG PO CAPS
100.0000 mg | ORAL_CAPSULE | Freq: Two times a day (BID) | ORAL | 0 refills | Status: AC
  Filled 2021-08-05: qty 14, 7d supply, fill #0

## 2021-08-05 MED ORDER — SODIUM CHLORIDE 0.9 % IV SOLN: INTRAVENOUS | Status: DC

## 2021-08-05 MED ORDER — LEVOTHYROXINE SODIUM 112 MCG PO TABS
112.0000 ug | ORAL_TABLET | Freq: Every day | ORAL | 0 refills | Status: DC
  Filled 2021-08-05: qty 30, 30d supply, fill #0

## 2021-08-05 NOTE — ED Notes (Signed)
PT at bedside.

## 2021-08-05 NOTE — ED Notes (Signed)
Patient called out stating she was unable to use the purewick to urinate and requesting to walk to toilet. Patient ambulated to bedside commode with standby assist and instructed to utilize call light for assistance returning to bed. Patient returned to bed independently without assistance.

## 2021-08-05 NOTE — Evaluation (Signed)
Physical Therapy Evaluation Patient Details Name: Debra Huber MRN: [id number] DOB: [birth year] Today's Date: 08/05/2021  History of Present Illness  Pt is a 71 y/o female presenting to ED 08/04/21 for hypotension after refusing to eat at home. Pt had significant weight loss (25+lbs in 2 weeks). PMH includes mild dementia, HTN, thyroid and colon CA, UTI, and Graves disease.  Clinical Impression  Patient presented to the ED on 08/04/21 with hypotension and generalized weakness. Pt's impairments include BLE weakness and balance deficits with mobility. This is limiting her ability to safely and independently perform functional mobility. Patient requires min guard to min assist with all functional mobility. Pt HR increased to the mid 130s with mobility. RN notified. SPT recommending home health PT upon D/C to maximize independence at home. Pt's husband reports he can assist with mobility as needed; reports he does not feel they need a wheelchair at this time. PT will continue to follow acutely.        Recommendations for follow up therapy are one component of a multi-disciplinary discharge planning process, led by the attending physician.  Recommendations may be updated based on patient status, additional functional criteria and insurance authorization.  Follow Up Recommendations Home health PT    Assistance Recommended at Discharge Frequent or constant Supervision/Assistance  Patient can return home with the following  A little help with walking and/or transfers;A little help with bathing/dressing/bathroom;Assistance with cooking/housework;Direct supervision/assist for medications management;Assistance with feeding;Assist for transportation;Help with stairs or ramp for entrance;Direct supervision/assist for financial management    Equipment Recommendations None recommended by PT (Pt husband declined wantingto recommend a wheelchair for home use for pt)  Recommendations for Other  Services       Functional Status Assessment Patient has had a recent decline in their functional status and demonstrates the ability to make significant improvements in function in a reasonable and predictable amount of time.     Precautions / Restrictions Precautions Precautions: Fall Restrictions Weight Bearing Restrictions: No      Mobility  Bed Mobility Overal bed mobility: Needs Assistance Bed Mobility: Supine to Sit, Sit to Supine     Supine to sit: Min assist Sit to supine: Min assist   General bed mobility comments: min A for for elevation of trunk and elevating legs off EOB with sit to supine.    Transfers Overall transfer level: Needs assistance Equipment used: 1 person hand held assist, 2 person hand held assist Transfers: Sit to/from Stand Sit to Stand: Min guard, +2 safety/equipment           General transfer comment: Pt initiated sit to stand with min guard A with SPT. Pt husband provided hand held assist halfway through transfer. Pt HR increased to mid 130s with standing.    Ambulation/Gait Ambulation/Gait assistance: Min assist, +2 safety/equipment Gait Distance (Feet): 2 Feet Assistive device: 2 person hand held assist Gait Pattern/deviations: Step-to pattern, Decreased step length - right, Decreased step length - left, Decreased stride length, Narrow base of support Gait velocity: decreased Gait velocity interpretation: <1.31 ft/sec, indicative of household ambulator   General Gait Details: Pt able to ambulate forwards, backwards, and laterally in room with min Ax2. Pt HR remained in mid 130s with ambulating in room. RN notified  Stairs            Wheelchair Mobility    Modified Rankin (Stroke Patients Only)       Balance Overall balance assessment: Needs assistance Sitting-balance support: Bilateral upper extremity supported, Feet supported Sitting  balance-Leahy Scale: Fair Sitting balance - Comments: required BUE support    Standing balance support: Bilateral upper extremity supported, During functional activity Standing balance-Leahy Scale: Poor Standing balance comment: required BUE hand held assist with static standing                             Pertinent Vitals/Pain Pain Assessment Pain Assessment: Faces Faces Pain Scale: No hurt    Home Living Family/patient expects to be discharged to:: Private residence Living Arrangements: Spouse/significant other Available Help at Discharge: Available 24 hours/day;Family Type of Home: House Home Access: Stairs to enter Entrance Stairs-Rails: None Entrance Stairs-Number of Steps: 1   Home Layout: Two level;Able to live on main level with bedroom/bathroom Home Equipment: BSC/3in1;Rolling Walker (2 wheels);Shower seat Additional Comments: spouse primary caregiver and retired; able to help 24/7. Family lives close by to assist husband in caring for pt    Prior Function Prior Level of Function : Needs assist       Physical Assist : Mobility (physical);ADLs (physical) Mobility (physical): Gait;Transfers ADLs (physical): IADLs;Dressing;Bathing Mobility Comments: pt reports husband helps her when she needs help when walking around. Has walker at home but rarely uses it at baseline. Able to take care of herself normally with husband's supervision ADLs Comments: spouse reports pt able to bathe/dress self prior to 2 weeks ago but has needed incrased assist recently     Hand Dominance        Extremity/Trunk Assessment   Upper Extremity Assessment Upper Extremity Assessment: Generalized weakness    Lower Extremity Assessment Lower Extremity Assessment: Defer to PT evaluation    Cervical / Trunk Assessment Cervical / Trunk Assessment: Normal  Communication   Communication: Expressive difficulties (hard time pronouncing words;required increased time and repetition)  Cognition Arousal/Alertness: Awake/alert Behavior During Therapy: WFL for  tasks assessed/performed Overall Cognitive Status: History of cognitive impairments - at baseline                                 General Comments: Pt has dementia. Oriented to self, and place. Able to tell me the year, not the month. Able to tell me she was in a hospital, not sure which one.        General Comments General comments (skin integrity, edema, etc.): spouse present and supportive; HR up to 127 with minimal activity    Exercises     Assessment/Plan    PT Assessment Patient needs continued PT services  PT Problem List Decreased strength;Decreased activity tolerance;Decreased balance;Decreased mobility;Decreased coordination;Decreased cognition;Decreased safety awareness;Decreased knowledge of use of DME       PT Treatment Interventions DME instruction;Functional mobility training;Therapeutic activities;Therapeutic exercise;Stair training;Gait training;Balance training;Neuromuscular re-education;Patient/family education    PT Goals (Current goals can be found in the Care Plan section)  Acute Rehab PT Goals Patient Stated Goal: to go home PT Goal Formulation: With patient/family Time For Goal Achievement: 08/12/21 Potential to Achieve Goals: Good    Frequency Min 3X/week     Co-evaluation               AM-PAC PT "6 Clicks" Mobility  Outcome Measure Help needed turning from your back to your side while in a flat bed without using bedrails?: None Help needed moving from lying on your back to sitting on the side of a flat bed without using bedrails?: A Little Help needed moving to and from  a bed to a chair (including a wheelchair)?: A Little Help needed standing up from a chair using your arms (e.g., wheelchair or bedside chair)?: A Little Help needed to walk in hospital room?: A Little Help needed climbing 3-5 steps with a railing? : A Lot 6 Click Score: 18    End of Session Equipment Utilized During Treatment: Gait belt Activity Tolerance:  Patient limited by fatigue Patient left: in bed;with call bell/phone within reach;with family/visitor present (on stretcher in ED) Nurse Communication: Mobility status PT Visit Diagnosis: Unsteadiness on feet (R26.81);Muscle weakness (generalized) (M62.81);Difficulty in walking, not elsewhere classified (R26.2)    Time: 0404-5913 PT Time Calculation (min) (ACUTE ONLY): 19 min   Charges:   PT Evaluation $PT Eval Low Complexity: 1 Low          Jonne Ply, SPT  [place] 08/05/2021, 2:29 PM

## 2021-08-05 NOTE — ED Notes (Signed)
Pt d/c home with visitor per MD order. Discharge summary reviewed, verbalize understanding. Meds from pharmacy provided. Off unit via WC- no s/s of acute distress noted at discharge,

## 2021-08-05 NOTE — ED Notes (Signed)
OT at bedside. 

## 2021-08-05 NOTE — Evaluation (Signed)
Occupational Therapy Evaluation Patient Details Name: Debra Huber MRN: [id number] DOB: 11-04-1950 Today's Date: 08/05/2021   History of Present Illness Pt is a 71 y/o female presenting to ED 08/04/21 for hypotension after refusing to eat at home. Pt had significant weight loss (25+lbs in 2 weeks). PMH includes mild dementia, HTN, thyroid and colon CA, UTI, and Graves disease.   Clinical Impression   Pt admitted for above and presents with problem list below, including generalized weakness, decreased activity tolerance, and impaired balance. Pt with dementia at baseline, spouse assists as needed.  Reports prior to 2 weeks ago, pt able to bathe/dress herself with spouses supervision but has required increased assist since decline.  Currently requires min assist for bed mobility, min assist for transfers and mobility in room, and up to min assist for ADLs.  Believe pt will benefit from continued OT services while admitted and after dc at [facility] level to optimize independence and decrease burden of care.  Will follow.      Recommendations for follow up therapy are one component of a multi-disciplinary discharge planning process, led by the attending physician.  Recommendations may be updated based on patient status, additional functional criteria and insurance authorization.   Follow Up Recommendations  Home health OT [facility] aide)    Assistance Recommended at Discharge Frequent or constant Supervision/Assistance  Patient can return home with the following A little help with walking and/or transfers;A little help with bathing/dressing/bathroom;Assistance with cooking/housework;Direct supervision/assist for medications management;Direct supervision/assist for financial management;Help with stairs or ramp for entrance;Assist for transportation    Functional Status Assessment  Patient has had a recent decline in their functional status and demonstrates the ability to make significant  improvements in function in a reasonable and predictable amount of time.  Equipment Recommendations  None recommended by OT (has all needed DME)    Recommendations for Other Services       Precautions / Restrictions Precautions Precautions: Fall Restrictions Weight Bearing Restrictions: No      Mobility Bed Mobility Overal bed mobility: Needs Assistance Bed Mobility: Supine to Sit, Sit to Supine     Supine to sit: Min assist Sit to supine: Min assist   General bed mobility comments: min A for for elevation of trunk    Transfers                          Balance Overall balance assessment: Needs assistance Sitting-balance support: Bilateral upper extremity supported, Feet supported Sitting balance-Leahy Scale: Fair Sitting balance - Comments: required BUE support   Standing balance support: Bilateral upper extremity supported, During functional activity Standing balance-Leahy Scale: Poor Standing balance comment: required BUE hand held assist with static standing                           ADL either performed or assessed with clinical judgement   ADL Overall ADL's : Needs assistance/impaired     Grooming: Minimal assistance;Sitting           Upper Body Dressing : Minimal assistance;Sitting   Lower Body Dressing: Minimal assistance;Sit to/from stand   Toilet Transfer: Minimal assistance;Ambulation Toilet Transfer Details (indicate cue type and reason): hand held support         Functional mobility during ADLs: Minimal assistance       Vision   Vision Assessment?: No apparent visual deficits     Perception     Praxis  Pertinent Vitals/Pain Pain Assessment Pain Assessment: Faces Faces Pain Scale: No hurt     Hand Dominance     Extremity/Trunk Assessment Upper Extremity Assessment Upper Extremity Assessment: Generalized weakness   Lower Extremity Assessment Lower Extremity Assessment: Defer to PT evaluation    Cervical / Trunk Assessment Cervical / Trunk Assessment: Normal   Communication Communication Communication: Expressive difficulties (hard time pronouncing words;required increased time and repetition)   Cognition Arousal/Alertness: Awake/alert Behavior During Therapy: WFL for tasks assessed/performed Overall Cognitive Status: History of cognitive impairments - at baseline                                 General Comments: Pt has dementia. Oriented to self, and place. follows simple commands with increased time.     General Comments  spouse present and supportive; HR up to 127 with minimal activity    Exercises     Shoulder Instructions      Home Living Family/patient expects to be discharged to:: Private residence Living Arrangements: Spouse/significant other Available Help at Discharge: Available 24 hours/day;Family Type of Home: House Home Access: Stairs to enter [employer] of Steps: 1 Entrance Stairs-Rails: None Home Layout: Two level;Able to live on main level with bedroom/bathroom     Bathroom Shower/Tub: Occupational psychologist: Standard Bathroom Accessibility: No   Home Equipment: Public relations account executive (2 wheels);Shower seat   Additional Comments: spouse primary caregiver and retired; able to help 24/7. Family lives close by to assist husband in caring for pt      Prior Functioning/Environment Prior Level of Function : Needs assist       Physical Assist : Mobility (physical);ADLs (physical) Mobility (physical): Gait;Transfers ADLs (physical): IADLs;Dressing;Bathing Mobility Comments: pt reports husband helps her when she needs help when walking around. Has walker at home but rarely uses it at baseline. Able to take care of herself normally with husband's supervision ADLs Comments: spouse reports pt able to bathe/dress self prior to 2 weeks ago but has needed incrased assist recently        OT Problem List: Decreased  strength;Decreased activity tolerance;Impaired balance (sitting and/or standing);Decreased cognition;Decreased safety awareness;Decreased knowledge of use of DME or AE;Decreased knowledge of precautions;Cardiopulmonary status limiting activity      OT Treatment/Interventions: Self-care/ADL training;Therapeutic exercise;DME and/or AE instruction;Therapeutic activities;Patient/family education;Balance training    OT Goals(Current goals can be found in the care plan section) Acute Rehab OT Goals Patient Stated Goal: home OT Goal Formulation: With patient Time For Goal Achievement: 08/19/21 Potential to Achieve Goals: Good  OT Frequency: Min 2X/week    Co-evaluation              AM-PAC OT "6 Clicks" Daily Activity     Outcome Measure Help from another person eating meals?: A Little Help from another person taking care of personal grooming?: A Little Help from another person toileting, which includes using toliet, bedpan, or urinal?: A Lot Help from another person bathing (including washing, rinsing, drying)?: A Little Help from another person to put on and taking off regular upper body clothing?: A Little Help from another person to put on and taking off regular lower body clothing?: A Little 6 Click Score: 17   End of Session Nurse Communication: Mobility status  Activity Tolerance: Patient tolerated treatment well Patient left: in bed;with call bell/phone within reach;with family/visitor present  OT Visit Diagnosis: Muscle weakness (generalized) (M62.81);Unsteadiness on feet (R26.81)  Time: 2883-3744 OT Time Calculation (min): 13 min Charges:  OT General Charges $OT Visit: 1 Visit OT Evaluation $OT Eval Moderate Complexity: 1 Mod  Jolaine Artist, OT Acute Rehabilitation Services Pager [phone] Office [phone]   Delight Stare 08/05/2021, 1:45 PM

## 2021-08-05 NOTE — Discharge Summary (Signed)
PatientPhysician Discharge Summary  Debra Huber UKG:[id number] DOB: Mar 19, 1951 DOA: 08/04/2021  PCP: Patient, No Pcp Per (Inactive)  Admit date: 08/04/2021 Discharge date: 08/05/2021    Admitted From: Home Disposition: Home  Recommendations for Outpatient Follow-up:  Follow up with PCP in 1-2 weeks Please obtain BMP/CBC in one week Please follow up with your PCP on the following pending results: Unresulted Labs (From admission, onward)     Start     Ordered   08/11/21 0500  Creatinine, serum  (enoxaparin (LOVENOX)    CrCl >/= 30 ml/min)  Weekly,   R     Comments: while on enoxaparin therapy    08/04/21 1613   08/05/21 1050  Lactic acid, plasma  ONCE - STAT,   STAT        08/05/21 1049   08/04/21 1949  Culture, blood (routine x 2)  BLOOD CULTURE X 2,   R (with TIMED occurrences)      08/04/21 1948   08/04/21 1942  Urine Culture  Once,   R       Question:  Indication  Answer:  Altered mental status (if no other cause identified)   08/04/21 1941   08/04/21 1515  T3, free  ONCE - STAT,   STAT        08/04/21 1514              Home Health: Yes Equipment/Devices: None  Discharge Condition: Stable CODE STATUS: Full code Diet recommendation: Cardiac  Subjective: Seen and examined in the ED.  She was fully alert but partially oriented.  She knew she was in the hospital but she could not tell me the month but was able to tell me the year.  She appeared to have some memory deficits.  She had no complaints.  Following HPI and ED course is copied from my colleague admitting hospitalist Dr. Shelby Mattocks H&P HPI: Debra Huber is a 71 y.o. female with medical history significant of HTN, hypothyroidism, hx of colon cancer in 2012 s/p hemicolectomy and chemo, memory loss who presented to ED with complaints of weakness and hypotension from her PCP. She doesn't know why she is here. She states she doesn't eat because she is just not hungry. She states she sleeps a lot at  home.    I talked to her significant other, Debra Huber, who states she was seen at her PCP office and had significant hypotension and weight loss and her PCP asked them to go to ED. She stopped eating about 2 weeks ago. He also reported she has been sleeping a lot over the past 2 weeks.  Before this she had decreased PO intake, but nothing like the last 2 weeks. She also had a lot of energy before 2 weeks ago.  He has to help remind her to eat. No falls or trauma,  no significant life events.    He states she has not been sick recently, no fever/chills. She has not complained of anything and tells me she feels fine. Denies any headaches, chest pain or palpitations, shortness of breath or cough, abdominal pain, N/V/D, change in BM, dysuria or leg swelling.      ER Course:  vitals: afebrile, bp: 70/57>88/64>118/75, HR: 63, RR: 18, oxygen: 100% on room air Pertinent labs: platelets 127, BUN: 40, creatinine: 1.60, lactic acid: 2.3, TSH: .0059,  CT head: no acute finding CXR: Subtle increased interstitial prominence at the lung bases bilaterally, may represent mild edema or atypical infection. 2. No lobar  consolidation or pleural effusion. In ED: given 2L bolus and started on maintenance IVF. Thyroid labs ordered.   Brief/Interim Summary: Patient was basically admitted due to hypotension as well as AKI and was considered to be failure to thrive at home.  Her UA was also indicative of UTI but unfortunately patient has unable to provide [place] any history of any symptoms so she was started on Rocephin.  She was started on IV fluids and her AKI has resolved and her renal function is back to normal.  She also came in with very low blood pressure.  With IV fluids, her blood pressure Improved and in the middle of her hospitalization she actually had some hypertension but currently her blood pressure is within normal range and that too is despite of holding her all of her antihypertensives which she were taking at  home and that includes clonidine, lisinopril, hydrochlorothiazide and amlodipine.  She does have slight hypokalemia at this morning.  She has already received potassium replacement.  She is getting potassium and IV fluids as well.  Patient's TSH low and T4 was high.  She has history of thyroidectomy and she is on Synthroid.  Her dose was decreased to 112 mcg from 125 mcg and she is going to be discharged on that.  Her husband talks to me and he says that patient looks much better and back to baseline.  He is now not worried about her having poor p.o. intake.  He is requesting that patient be discharged home.  Per my request, he was willing to let her stay a few hours so we can repeat her lactic acid and have her seen by PT OT.  PT OT recommended home health PT OT as well as aide which has been ordered for her.  Regarding her possible UTI, I am discharging her on Macrobid for 7 days.  Due to the fact that patient has poor appetite and poor p.o. intake and she came in with hypotension and that she is on 4 different medications, I have taken the liberty to make significant changes in her antihypertensives and I am going to discontinue her lisinopril as well as hydrochlorothiazide and have cut down her clonidine from 0.2mg  to 0.1 mg twice daily and will continue her home dose of amlodipine.  Her lactic acid is pending at this point in time, if that will be resolved, patient will be discharged this afternoon.  Again patient is being discharged per husband's request.  Discharge plan was discussed with patient and/or family member and they verbalized understanding and agreed with it.  Discharge Diagnoses:  Principal Problem:   Hypotension Active Problems:   Malignant neoplasm of colon (HCC)   Hypertension   thyroid cancer s/p excision    AKI (acute kidney injury) ([place])   Failure to thrive in adult   Memory impairment   UTI (urinary tract infection)    Discharge Instructions   Allergies as of 08/05/2021        Reactions   Penicillins Swelling   Did PCN reaction causing immediate rash, facial/tongue/throat swelling, SOB or lightheadedness with hypotension: Yes Did PCN reaction causing severe rash involving mucus membranes or skin necrosis: unknown Did PCN reaction that required hospitalization Yes- was in the hospital and i was given penicillin and had a reaction Did PCN reaction occurring within the last 10 years: No, about 15 years or more If all of the above answers are "NO", then may proceed with Cephalosporin use.  Medication List     STOP taking these medications    hydrochlorothiazide 25 MG tablet Commonly known as: HYDRODIURIL   lisinopril 40 MG tablet Commonly known as: ZESTRIL       TAKE these medications    amitriptyline 25 MG tablet Commonly known as: ELAVIL Take 1 tablet (25 mg total) by mouth at bedtime.   amLODipine 5 MG tablet Commonly known as: NORVASC Take 1 tablet (5 mg total) by mouth daily.   Centrum Silver Chew Chew 1 tablet by mouth daily.   cloNIDine 0.1 MG tablet Commonly known as: CATAPRES Take 1 tablet (0.1 mg total) by mouth 2 (two) times daily. What changed:  medication strength how much to take   CO-Q 10 OMEGA-3 FISH OIL PO Take by mouth.   levothyroxine 112 MCG tablet Commonly known as: SYNTHROID Take 1 tablet (112 mcg total) by mouth daily before breakfast. Start taking on: August 06, 2021 What changed:  medication strength how much to take when to take this   nitrofurantoin (macrocrystal-monohydrate) 100 MG capsule Commonly known as: Macrobid Take 1 capsule (100 mg total) by mouth 2 (two) times daily for 7 days.   vitamin E 1000 UNIT capsule Take 1,000 Units by mouth daily.        Allergies  Allergen Reactions   Penicillins Swelling    Did PCN reaction causing immediate rash, facial/tongue/throat swelling, SOB or lightheadedness with hypotension: Yes Did PCN reaction causing severe rash involving mucus  membranes or skin necrosis: unknown Did PCN reaction that required hospitalization Yes- was in the hospital and i was given penicillin and had a reaction Did PCN reaction occurring within the last 10 years: No, about 15 years or more If all of the above answers are "NO", then may proceed with Cephalosporin use.     Consultations: None   Procedures/Studies: DG Chest 2 View  Result Date: 08/05/2021 CLINICAL DATA:  Weakness, fatigue, weight loss. EXAM: CHEST - 2 VIEW COMPARISON:  08/04/2021 and CT chest 10/05/2013. FINDINGS: Patient is rotated. Trachea is midline. Heart size stable. Lungs are somewhat low in volume with mild bibasilar airspace opacification. No pleural fluid. Left hemidiaphragm is minimally elevated. IMPRESSION: Probable mild bibasilar atelectasis. Difficult to exclude pneumonia. Electronically Signed   By: Lorin Picket M.D.   On: 08/05/2021 08:31   CT Head Wo Contrast  Result Date: 08/04/2021 CLINICAL DATA:  Mental status change, unknown cause EXAM: CT HEAD WITHOUT CONTRAST TECHNIQUE: Contiguous axial images were obtained from the base of the skull through the vertex without intravenous contrast. RADIATION DOSE REDUCTION: This exam was performed according to the departmental dose-optimization program which includes automated exposure control, adjustment of the mA and/or kV according to patient size and/or use of iterative reconstruction technique. COMPARISON:  01/04/2018 FINDINGS: Brain: No evidence of acute infarction, hemorrhage, hydrocephalus, extra-axial collection or mass lesion/mass effect. Chronic bilateral basal ganglia lacunar infarcts. Moderate low-density changes within the periventricular and subcortical white matter compatible with chronic microvascular ischemic change. Mild diffuse cerebral volume loss. Vascular: Atherosclerotic calcifications involving the large vessels of the skull base. No unexpected hyperdense vessel. Skull: Normal. Negative for fracture or focal  lesion. Sinuses/Orbits: No acute finding. Other: None. IMPRESSION: 1. No acute intracranial findings. 2. Chronic microvascular ischemic change and cerebral volume loss. Electronically Signed   By: Davina Poke D.O.   On: 08/04/2021 14:09   DG Chest Port 1 View  Result Date: 08/04/2021 CLINICAL DATA:  Weakness EXAM: PORTABLE CHEST 1 VIEW COMPARISON:  April of 2015 chest  CT FINDINGS: EKG leads project over the chest. A linear metallic density extends from the upper margin of the radiograph projects over the thoracic inlet. A similar appearing structure is seen along the LEFT lateral neck. A necklace can be seen on the scout radiograph for the patient's recent CT of the head. Cardiomediastinal contours and hilar structures are unremarkable. No lobar consolidation. No sign of pleural effusion. Subtle increased interstitial prominence at the lung bases bilaterally. No visible pneumothorax. On limited assessment there is no acute skeletal process. IMPRESSION: 1. Subtle increased interstitial prominence at the lung bases bilaterally, may represent mild edema or atypical infection. 2. No lobar consolidation or pleural effusion. 3. Metallic structure projecting over the thoracic inlet just along the LEFT border of the spine may be a part of the patient's necklace. Correlate with any indwelling devices such as temperature probe in the esophagus. Electronically Signed   By: Zetta Bills M.D.   On: 08/04/2021 14:29     Discharge Exam: Vitals:   08/05/21 1230 08/05/21 1245  BP: 129/88 137/87  Pulse: 79 82  Resp: 14 (!) 21  Temp:    SpO2: 100% 100%   Vitals:   08/05/21 0933 08/05/21 1115 08/05/21 1230 08/05/21 1245  BP: 134/89 (!) 142/95 129/88 137/87  Pulse: 81 85 79 82  Resp: 20 13 14  (!) 21  Temp:      TempSrc:      SpO2: 100% 100% 100% 100%  Weight:      Height:        General: Pt is alert, awake, not in acute distress Cardiovascular: RRR, S1/S2 +, no rubs, no gallops Respiratory: CTA  bilaterally, no wheezing, no rhonchi Abdominal: Soft, NT, ND, bowel sounds + Extremities: no edema, no cyanosis    The results of significant diagnostics from this hospitalization (including imaging, microbiology, ancillary and laboratory) are listed below for reference.     Microbiology: Recent Results (from the past 240 hour(s))  Resp Panel by RT-PCR (Flu A&B, Covid) Nasopharyngeal Swab     Status: None   Collection Time: 08/04/21  1:43 PM   Specimen: Nasopharyngeal Swab; Nasopharyngeal(NP) swabs in vial transport medium  Result Value Ref Range Status   SARS Coronavirus 2 by RT PCR NEGATIVE NEGATIVE Final    Comment: (NOTE) SARS-CoV-2 target nucleic acids are NOT DETECTED.  The SARS-CoV-2 RNA is generally detectable in upper respiratory specimens during the acute phase of infection. The lowest concentration of SARS-CoV-2 viral copies this assay can detect is 138 copies/mL. A negative result does not preclude SARS-Cov-2 infection and should not be used as the sole basis for treatment or other patient management decisions. A negative result may occur with  improper specimen collection/handling, submission of specimen other than nasopharyngeal swab, presence of viral mutation(s) within the areas targeted by this assay, and inadequate number of viral copies(<138 copies/mL). A negative result must be combined with clinical observations, patient history, and epidemiological information. The expected result is Negative.  Fact Sheet for Patients:  EntrepreneurPulse.com.au  Fact Sheet for Healthcare Providers:  IncredibleEmployment.be  This test is no t yet approved or cleared by the [place] FDA and  has been authorized for detection and/or diagnosis of SARS-CoV-2 by FDA under an Emergency Use Authorization (EUA). This EUA will remain  in effect (meaning this test can be used) for the duration of the COVID-19 declaration under Section  564(b)(1) of the Act, 21 U.S.C.section 360bbb-3(b)(1), unless the authorization is terminated  or revoked sooner.  Influenza A by PCR NEGATIVE NEGATIVE Final   Influenza B by PCR NEGATIVE NEGATIVE Final    Comment: (NOTE) The Xpert Xpress SARS-CoV-2/FLU/RSV plus assay is intended as an aid in the diagnosis of influenza from Nasopharyngeal swab specimens and should not be used as a sole basis for treatment. Nasal washings and aspirates are unacceptable for Xpert Xpress SARS-CoV-2/FLU/RSV testing.  Fact Sheet for Patients: EntrepreneurPulse.com.au  Fact Sheet for Healthcare Providers: IncredibleEmployment.be  This test is not yet approved or cleared by the [place] FDA and has been authorized for detection and/or diagnosis of SARS-CoV-2 by FDA under an Emergency Use Authorization (EUA). This EUA will remain in effect (meaning this test can be used) for the duration of the COVID-19 declaration under Section 564(b)(1) of the Act, 21 U.S.C. section 360bbb-3(b)(1), unless the authorization is terminated or revoked.  Performed at [facility], [place] [address] [place], [place] [address]      Labs: BNP (last 3 results) No results for input(s): BNP in the last 8760 hours. Basic Metabolic Panel: Recent Labs  Lab 08/04/21 1320 08/05/21 0155  NA 134* 135  K 4.1 2.8*  CL 97* 100  CO2 23 23  GLUCOSE 119* 79  BUN 40* 28*  CREATININE 1.60* 0.95  CALCIUM 8.4* 8.1*   Liver Function Tests: Recent Labs  Lab 08/04/21 1320 08/05/21 0155  AST 44* 36  ALT 31 25  ALKPHOS 72 60  BILITOT 0.6 0.5  PROT 7.2 6.1*  ALBUMIN 3.6 3.0*   No results for input(s): LIPASE, AMYLASE in the last 168 hours. No results for input(s): AMMONIA in the last 168 hours. CBC: Recent Labs  Lab 08/04/21 1320 08/05/21 0155  WBC 6.0 3.8*  NEUTROABS 4.4  --   HGB 13.9 12.9  HCT 42.8 39.6  MCV 82.5 81.8  PLT 127* 115*   Cardiac Enzymes: No  results for input(s): CKTOTAL, CKMB, CKMBINDEX, TROPONINI in the last 168 hours. BNP: Invalid input(s): POCBNP CBG: No results for input(s): GLUCAP in the last 168 hours. D-Dimer No results for input(s): DDIMER in the last 72 hours. Hgb A1c No results for input(s): HGBA1C in the last 72 hours. Lipid Profile No results for input(s): CHOL, HDL, LDLCALC, TRIG, CHOLHDL, LDLDIRECT in the last 72 hours. Thyroid function studies Recent Labs    08/04/21 1429  TSH 0.059*   Anemia work up Recent Labs    08/05/21 0012  VITAMINB12 463   Urinalysis    Component Value Date/Time   COLORURINE AMBER (A) 08/04/2021 1700   APPEARANCEUR CLOUDY (A) 08/04/2021 1700   LABSPEC 1.013 08/04/2021 1700   PHURINE 5.0 08/04/2021 1700   GLUCOSEU NEGATIVE 08/04/2021 1700   HGBUR MODERATE (A) 08/04/2021 1700   BILIRUBINUR NEGATIVE 08/04/2021 1700   BILIRUBINUR neg 08/06/2014 1507   KETONESUR 5 (A) 08/04/2021 1700   PROTEINUR 30 (A) 08/04/2021 1700   UROBILINOGEN 0.2 08/06/2014 1507   NITRITE NEGATIVE 08/04/2021 1700   LEUKOCYTESUR LARGE (A) 08/04/2021 1700   Sepsis Labs Invalid input(s): PROCALCITONIN,  WBC,  LACTICIDVEN Microbiology Recent Results (from the past 240 hour(s))  Resp Panel by RT-PCR (Flu A&B, Covid) Nasopharyngeal Swab     Status: None   Collection Time: 08/04/21  1:43 PM   Specimen: Nasopharyngeal Swab; Nasopharyngeal(NP) swabs in vial transport medium  Result Value Ref Range Status   SARS Coronavirus 2 by RT PCR NEGATIVE NEGATIVE Final    Comment: (NOTE) SARS-CoV-2 target nucleic acids are NOT DETECTED.  The SARS-CoV-2 RNA is generally detectable  in upper respiratory specimens during the acute phase of infection. The lowest concentration of SARS-CoV-2 viral copies this assay can detect is 138 copies/mL. A negative result does not preclude SARS-Cov-2 infection and should not be used as the sole basis for treatment or other patient management decisions. A negative result may  occur with  improper specimen collection/handling, submission of specimen other than nasopharyngeal swab, presence of viral mutation(s) within the areas targeted by this assay, and inadequate number of viral copies(<138 copies/mL). A negative result must be combined with clinical observations, patient history, and epidemiological information. The expected result is Negative.  Fact Sheet for Patients:  EntrepreneurPulse.com.au  Fact Sheet for Healthcare Providers:  IncredibleEmployment.be  This test is no t yet approved or cleared by the [place] FDA and  has been authorized for detection and/or diagnosis of SARS-CoV-2 by FDA under an Emergency Use Authorization (EUA). This EUA will remain  in effect (meaning this test can be used) for the duration of the COVID-19 declaration under Section 564(b)(1) of the Act, 21 U.S.C.section 360bbb-3(b)(1), unless the authorization is terminated  or revoked sooner.       Influenza A by PCR NEGATIVE NEGATIVE Final   Influenza B by PCR NEGATIVE NEGATIVE Final    Comment: (NOTE) The Xpert Xpress SARS-CoV-2/FLU/RSV plus assay is intended as an aid in the diagnosis of influenza from Nasopharyngeal swab specimens and should not be used as a sole basis for treatment. Nasal washings and aspirates are unacceptable for Xpert Xpress SARS-CoV-2/FLU/RSV testing.  Fact Sheet for Patients: EntrepreneurPulse.com.au  Fact Sheet for Healthcare Providers: IncredibleEmployment.be  This test is not yet approved or cleared by the [place] FDA and has been authorized for detection and/or diagnosis of SARS-CoV-2 by FDA under an Emergency Use Authorization (EUA). This EUA will remain in effect (meaning this test can be used) for the duration of the COVID-19 declaration under Section 564(b)(1) of the Act, 21 U.S.C. section 360bbb-3(b)(1), unless the authorization is terminated  or revoked.  Performed at [facility], [place] [address], [place], [place] [address]      Time coordinating discharge: Over 30 minutes  SIGNED:   Darliss Cheney, MD  [facility] 08/05/2021, 1:03 PM  If 7PM-7AM, please contact night-coverage [email or web address]

## 2021-08-05 NOTE — ED Notes (Signed)
Assist patient to the bedside toilet patient did well patient is now back in bed on the monitor changed patient sheets got patient a warm blanket patient is resting with call bell in reach

## 2021-08-06 LAB — URINE CULTURE: Culture: >=100000 — AB

## 2021-08-10 LAB — CULTURE, BLOOD (ROUTINE X 2)
Culture: NO GROWTH
Special Requests: ADEQUATE

## 2021-09-28 ENCOUNTER — Other Ambulatory Visit: Payer: Self-pay

## 2021-09-28 ENCOUNTER — Emergency Department (HOSPITAL_COMMUNITY): Payer: Medicare Other

## 2021-09-28 ENCOUNTER — Encounter (HOSPITAL_COMMUNITY): Payer: Self-pay

## 2021-09-28 ENCOUNTER — Emergency Department (HOSPITAL_COMMUNITY)
Admission: EM | Admit: 2021-09-28 | Discharge: 2021-09-30 | Disposition: A | Discharge status: Skilled Nursing Facility | Payer: Medicare Other | Attending: Emergency Medicine | Admitting: Emergency Medicine

## 2021-09-28 DIAGNOSIS — Z79899 Other long term (current) drug therapy: Secondary | ICD-10-CM | POA: Diagnosis not present

## 2021-09-28 DIAGNOSIS — R627 Adult failure to thrive: Secondary | ICD-10-CM | POA: Diagnosis not present

## 2021-09-28 DIAGNOSIS — R531 Weakness: Secondary | ICD-10-CM | POA: Insufficient documentation

## 2021-09-28 DIAGNOSIS — Z7982 Long term (current) use of aspirin: Secondary | ICD-10-CM | POA: Diagnosis not present

## 2021-09-28 DIAGNOSIS — E860 Dehydration: Secondary | ICD-10-CM | POA: Diagnosis not present

## 2021-09-28 DIAGNOSIS — R410 Disorientation, unspecified: Secondary | ICD-10-CM | POA: Insufficient documentation

## 2021-09-28 DIAGNOSIS — F0150 Vascular dementia without behavioral disturbance: Secondary | ICD-10-CM

## 2021-09-28 DIAGNOSIS — D649 Anemia, unspecified: Secondary | ICD-10-CM | POA: Insufficient documentation

## 2021-09-28 DIAGNOSIS — F0390 Unspecified dementia without behavioral disturbance: Secondary | ICD-10-CM | POA: Diagnosis not present

## 2021-09-28 LAB — CBC WITH DIFFERENTIAL/PLATELET
Abs Immature Granulocytes: 0.02 10*3/uL (ref 0.00–0.07)
Basophils Absolute: 0 10*3/uL (ref 0.0–0.1)
Basophils Relative: 0 %
Eosinophils Absolute: 0 10*3/uL (ref 0.0–0.5)
Eosinophils Relative: 0 %
HCT: 37.4 % (ref 36.0–46.0)
Hemoglobin: 11.9 g/dL — ABNORMAL LOW (ref 12.0–15.0)
Immature Granulocytes: 1 %
Lymphocytes Relative: 15 %
Lymphs Abs: 0.5 10*3/uL — ABNORMAL LOW (ref 0.7–4.0)
MCH: 26.2 pg (ref 26.0–34.0)
MCHC: 31.8 g/dL (ref 30.0–36.0)
MCV: 82.2 fL (ref 80.0–100.0)
Monocytes Absolute: 0.2 10*3/uL (ref 0.1–1.0)
Monocytes Relative: 6 %
Neutro Abs: 2.6 10*3/uL (ref 1.7–7.7)
Neutrophils Relative %: 78 %
Platelets: 164 10*3/uL (ref 150–400)
RBC: 4.55 MIL/uL (ref 3.87–5.11)
RDW: 15.7 % — ABNORMAL HIGH (ref 11.5–15.5)
WBC: 3.4 10*3/uL — ABNORMAL LOW (ref 4.0–10.5)
nRBC: 0 % (ref 0.0–0.2)

## 2021-09-28 LAB — COMPREHENSIVE METABOLIC PANEL
ALT: 34 U/L (ref 0–44)
AST: 60 U/L — ABNORMAL HIGH (ref 15–41)
Albumin: 3.1 g/dL — ABNORMAL LOW (ref 3.5–5.0)
Alkaline Phosphatase: 67 U/L (ref 38–126)
Anion gap: 8 (ref 5–15)
BUN: 29 mg/dL — ABNORMAL HIGH (ref 8–23)
CO2: 27 mmol/L (ref 22–32)
Calcium: 8.2 mg/dL — ABNORMAL LOW (ref 8.9–10.3)
Chloride: 106 mmol/L (ref 98–111)
Creatinine, Ser: 0.73 mg/dL (ref 0.44–1.00)
GFR, Estimated: >60 mL/min (ref >60)
Glucose, Bld: 85 mg/dL (ref 70–99)
Potassium: 3.5 mmol/L (ref 3.5–5.1)
Sodium: 141 mmol/L (ref 135–145)
Total Bilirubin: 0.9 mg/dL (ref 0.3–1.2)
Total Protein: 7.2 g/dL (ref 6.5–8.1)

## 2021-09-28 LAB — URINALYSIS, ROUTINE W REFLEX MICROSCOPIC
Bilirubin Urine: NEGATIVE
Glucose, UA: NEGATIVE mg/dL
Hgb urine dipstick: NEGATIVE
Ketones, ur: 5 mg/dL — AB
Leukocytes,Ua: NEGATIVE
Nitrite: NEGATIVE
Protein, ur: 30 mg/dL — AB
Specific Gravity, Urine: 1.018 (ref 1.005–1.030)
pH: 5 (ref 5.0–8.0)

## 2021-09-28 LAB — LACTIC ACID, PLASMA: Lactic Acid, Venous: 1.5 mmol/L (ref 0.5–1.9)

## 2021-09-28 MED ORDER — SODIUM CHLORIDE 0.9 % IV BOLUS
1000.0000 mL | Freq: Once | INTRAVENOUS | Status: AC
  Administered 2021-09-28: 1000 mL via INTRAVENOUS

## 2021-09-28 NOTE — ED Provider Notes (Signed)
?[facility] DEPT ?Provider Note ? ? ?CSN: [id number] ?Arrival date & time: 09/28/21  1708 ? ?  ? ?History ? ?Chief Complaint  ?Patient presents with  ? Failure To Thrive  ? ? ?Debra Huber is a 71 y.o. female. ? ?Patient has dementia and has been getting weaker and weaker and more confused.  Her primary care doctor sent her to the emergency department to get evaluation by social work for possible placement. ? ?The history is provided by the patient and a relative. No language interpreter was used.  ?Weakness ?Severity:  Moderate ?Onset quality:  Gradual ?Timing:  Constant ?Progression:  Worsening ?Chronicity:  Recurrent ?Context: not alcohol use   ?Relieved by:  Nothing ?Worsened by:  Nothing ?Ineffective treatments:  None tried ?Associated symptoms: no abdominal pain   ?Risk factors: no anemia   ? ?  ? ?Home Medications ?Prior to Admission medications   ?Medication Sig Start Date End Date Taking? Authorizing Provider  ?amLODipine (NORVASC) 5 MG tablet Take 1 tablet (5 mg total) by mouth daily. 09/18/15  Yes [name], MD  ?aspirin EC 81 MG tablet Take 81 mg by mouth in the morning. Swallow whole.   Yes [provider]  ?donepezil (ARICEPT) 10 MG tablet Take 10 mg by mouth daily.   Yes [provider]  ?NP THYROID 90 MG tablet Take 90 mg by mouth daily before breakfast.   Yes [provider]  ?rosuvastatin (CRESTOR) 20 MG tablet Take 20 mg by mouth every evening.   Yes [provider]  ?valsartan-hydrochlorothiazide (DIOVAN-HCT) 320-25 MG tablet Take 1 tablet by mouth daily in the afternoon.   Yes [provider]  ?amitriptyline (ELAVIL) 25 MG tablet Take 1 tablet (25 mg total) by mouth at bedtime. ?Patient not taking: Reported on 09/28/2021 09/18/15   [name], MD  ?cloNIDine (CATAPRES) 0.1 MG tablet Take 1 tablet (0.1 mg total) by mouth 2 (two) times daily. ?Patient not taking: Reported on 09/28/2021 08/05/21 09/28/21   Darliss Cheney, MD  ?levothyroxine (SYNTHROID) 112 MCG tablet Take 1 tablet (112 mcg total) by mouth daily before breakfast. ?Patient not taking: Reported on 09/28/2021 08/06/21 09/28/21  Darliss Cheney, MD  ?   ? ?Allergies    ?Penicillins   ? ?Review of Systems   ?Review of Systems  ?Unable to perform ROS: Dementia  ?Gastrointestinal:  Negative for abdominal pain.  ?Neurological:  Positive for weakness.  ? ?Physical Exam ?Updated Vital Signs ?BP 124/87   Pulse (!) 108   Temp 98.4 ?F (36.9 ?C)   Resp 18   Ht '5\' 7"'$  (1.702 m)   Wt 62.6 kg   SpO2 96%   BMI 21.61 kg/m?  ?Physical Exam ?Vitals reviewed.  ?Constitutional:   ?   Appearance: She is well-developed.  ?HENT:  ?   Head: Normocephalic.  ?   Nose: Nose normal.  ?Eyes:  ?   General: No scleral icterus. ?   Conjunctiva/sclera: Conjunctivae normal.  ?Neck:  ?   Thyroid: No thyromegaly.  ?Cardiovascular:  ?   Rate and Rhythm: Normal rate and regular rhythm.  ?   Heart sounds: No murmur heard. ?  No friction rub. No gallop.  ?Pulmonary:  ?   Breath sounds: No stridor. No wheezing or rales.  ?Chest:  ?   Chest wall: No tenderness.  ?Abdominal:  ?   General: There is no distension.  ?   Tenderness: There is no abdominal tenderness. There is no rebound.  ?  Musculoskeletal:     ?   General: Normal range of motion.  ?   Cervical back: Neck supple.  ?   Comments: Patient is too weak to ambulate  ?Lymphadenopathy:  ?   Cervical: No cervical adenopathy.  ?Skin: ?   Findings: No erythema or rash.  ?Neurological:  ?   Mental Status: She is alert.  ?   Motor: No abnormal muscle tone.  ?   Coordination: Coordination normal.  ?   Comments: Oriented to person only  ?Psychiatric:     ?   Behavior: Behavior normal.  ? ? ?ED Results / Procedures / Treatments   ?Labs ?(all labs ordered are listed, but only abnormal results are displayed) ?Labs Reviewed  ?CBC WITH DIFFERENTIAL/PLATELET - Abnormal; Notable for the following components:  ?    Result Value  ? WBC 3.4 (*)   ? Hemoglobin  11.9 (*)   ? RDW 15.7 (*)   ? Lymphs Abs 0.5 (*)   ? All other components within normal limits  ?COMPREHENSIVE METABOLIC PANEL - Abnormal; Notable for the following components:  ? BUN 29 (*)   ? Calcium 8.2 (*)   ? Albumin 3.1 (*)   ? AST 60 (*)   ? All other components within normal limits  ?URINALYSIS, ROUTINE W REFLEX MICROSCOPIC - Abnormal; Notable for the following components:  ? APPearance HAZY (*)   ? Ketones, ur 5 (*)   ? Protein, ur 30 (*)   ? Bacteria, UA RARE (*)   ? All other components within normal limits  ?LACTIC ACID, PLASMA  ?PATHOLOGIST SMEAR REVIEW  ? ? ?EKG ?None ? ?Radiology ?CT Head Wo Contrast ? ?Result Date: 09/28/2021 ?CLINICAL DATA:  Dizziness, failure to thrive, altered mental status EXAM: CT HEAD WITHOUT CONTRAST TECHNIQUE: Contiguous axial images were obtained from the base of the skull through the vertex without intravenous contrast. RADIATION DOSE REDUCTION: This exam was performed according to the departmental dose-optimization program which includes automated exposure control, adjustment of the mA and/or kV according to patient size and/or use of iterative reconstruction technique. COMPARISON:  08/04/2021 FINDINGS: Brain: No evidence of acute infarction, hemorrhage, cerebral edema, mass, mass effect, or midline shift. No hydrocephalus or extra-axial fluid collection. Chronic bilateral basal ganglia lacunar infarcts. Periventricular white matter changes, likely the sequela of chronic small vessel ischemic disease. Mildly advanced cerebral volume loss for age. Vascular: No hyperdense vessel. Atherosclerotic calcifications in the intracranial carotid and vertebral arteries. Skull: Normal. Negative for fracture or focal lesion. Sinuses/Orbits: No acute finding. Other: The mastoid air cells are well aerated. IMPRESSION: IMPRESSION No acute intracranial process. Electronically Signed   By: Merilyn Baba M.D.   On: 09/28/2021 18:05  ? ?DG Chest Port 1 View ? ?Result Date: 09/28/2021 ?CLINICAL  DATA:  Weakness Dementia Rapid decline EXAM: PORTABLE CHEST - 1 VIEW COMPARISON:  08/05/2021 FINDINGS: Cardiomediastinal silhouette and pulmonary vasculature are within normal limits. Lungs are clear. Surgical clips noted at the base of the neck bilaterally. IMPRESSION: No acute cardiopulmonary process. Electronically Signed   By: Miachel Roux M.D.   On: 09/28/2021 17:54   ? ?Procedures ?Procedures  ? ? ?Medications Ordered in ED ?Medications  ?sodium chloride 0.9 % bolus 1,000 mL (1,000 mLs Intravenous New Bag/Given 09/28/21 2048)  ? ? ?ED Course/ Medical Decision Making/ A&P ?  ?                        ?Medical Decision Making ?Amount and/or  Complexity of Data Reviewed ?Labs: ordered. ?Radiology: ordered. ?ECG/medicine tests: ordered. ? ?This patient presents to the ED for concern of general weakness.  Inability to ambulate.  Not eating or drinking much, this involves an extensive number of treatment options, and is a complaint that carries with it a high risk of complications and morbidity.  The differential diagnosis includes dementia UTI ? ? ?Co morbidities that complicate the patient evaluation ? ?Dementia ? ? ?Additional history obtained: ? ?Additional history obtained from daughter and significant other ?External records from outside source obtained and reviewed including hospital record ? ? ?Lab Tests: ? ?I Ordered, and personally interpreted labs.  The pertinent results include: CBC and chemistry show mild elevation of BUN with mild dehydration and mild anemia with hemoglobin 11.9 ? ? ?Imaging Studies ordered: ? ?I ordered imaging studies including chest x-ray and CT head ?I independently visualized and interpreted imaging which showed no acute disease ?I agree with the radiologist interpretation ? ? ?Cardiac Monitoring: / EKG: ? ?The patient was maintained on a cardiac monitor.  I personally viewed and interpreted the cardiac monitored which showed an underlying rhythm of: Normal sinus  rhythm ? ? ?Consultations Obtained: ? ?I requested consultation with the social work,  and discussed lab and imaging findings as well as pertinent plan - they recommend: Still pending ? ? ?Problem List / ED Course / Critical intervent

## 2021-09-28 NOTE — ED Triage Notes (Signed)
Pt BIB [facility] from home for failure to thrive. Pt diagnosed with dementia recently and has had a rapid decline over the past month. Pt unable to walk and has returned to a "childlike state".  ?108/80 ?HR 90  ?105 glucose ?

## 2021-09-29 DIAGNOSIS — R410 Disorientation, unspecified: Secondary | ICD-10-CM | POA: Diagnosis not present

## 2021-09-29 NOTE — Evaluation (Addendum)
Physical Therapy Evaluation ?Patient Details ?Name: Debra Huber ?MRN: [id number] ?DOB: Mar 06, 1951 ?Today's Date: 09/29/2021 ? ?History of Present Illness ? 71 yo female admitted with UTI, FTT, weakness, poor po intake. Hx of Grave's disease, dementia, colon ca  ?Clinical Impression ? On eval in ED, pt required Mod A for bed mobility. She sat EOB for ~4-5 minutes with varying level of assist (min A - min guard A). Pt reported fatigue and requested to return to supine to sleep. She followed 1 step commands inconsistently-multimodal cueing required, especially for MMT. Family was present during session. Husband reports pt was ambulatory with a RW up until ~2 weeks ago. Discussed d/c plan-PT recommendation is for ST rehab at SNF to improve strength, regain functional mobility, and decrease burden of care on family prior to returning home.  ?   ? ?Recommendations for follow up therapy are one component of a multi-disciplinary discharge planning process, led by the attending physician.  Recommendations may be updated based on patient status, additional functional criteria and insurance authorization. ? ?Follow Up Recommendations Skilled nursing-short term rehab (<3 hours/day) ? ?  ?Assistance Recommended at Discharge Frequent or constant Supervision/Assistance  ?Patient can return home with the following ? Two people to help with walking and/or transfers;A lot of help with bathing/dressing/bathroom;Direct supervision/assist for medications management;Direct supervision/assist for financial management ? ?  ?Equipment Recommendations None recommended by PT  ?Recommendations for Other Services ?    ?  ?Functional Status Assessment Patient has had a recent decline in their functional status and demonstrates the ability to make significant improvements in function in a reasonable and predictable amount of time.  ? ?  ?Precautions / Restrictions Precautions ?Precautions: Fall ?Restrictions ?Weight Bearing  Restrictions: No  ? ?  ? ?Mobility ? Bed Mobility ?Overal bed mobility: Needs Assistance ?Bed Mobility: Rolling, Supine to Sit, Sit to Supine ?Rolling: Min assist ?  ?Supine to sit: Mod assist ?Sit to supine: Mod assist ?  ?General bed mobility comments: Assist for trunk and LEs. Increased time. Multimodal cueing required. Sat EOB for ~4-5 minutes with Min guard-Min A. Pt reported being tired and wanting to sleep-distracted by this. ?  ? ?Transfers ?  ?  ?  ?  ?  ?  ?  ?  ?  ?General transfer comment: NT on today-unable to fully assess LE strength to determine safety for standing. Also pt wanting to return to supine 2* fatigue/wanting to sleep. ?  ? ?Ambulation/Gait ?  ?  ?  ?  ?  ?  ?  ?  ? ?Stairs ?  ?  ?  ?  ?  ? ?Wheelchair Mobility ?  ? ?Modified Rankin (Stroke Patients Only) ?  ? ?  ? ?Balance Overall balance assessment: Needs assistance ?Sitting-balance support: Bilateral upper extremity supported, Feet unsupported ?Sitting balance-Leahy Scale: Poor ?Sitting balance - Comments: required BUE support ?  ?  ?  ?  ?  ?  ?  ?  ?  ?  ?  ?  ?  ?  ?  ?   ? ? ? ?Pertinent Vitals/Pain Pain Assessment ?Pain Assessment: Faces ?Faces Pain Scale: No hurt  ? ? ?Home Living Family/patient expects to be discharged to:: Skilled nursing facility ?Living Arrangements: Spouse/significant other ?Available Help at Discharge: Available 24 hours/day;Family ?Type of Home: House ?  ?Entrance Stairs-Rails: None ?Entrance Stairs-Number of Steps: 1 ?  ?Home Layout: Able to live on main level with bedroom/bathroom;Two level ?Home Equipment: BSC/3in1;Rolling Walker (2 wheels);Shower seat ?Additional  Comments: spouse primary caregiver and retired; able to help 24/7. Family lives close by to assist husband in caring for pt  ?  ?Prior Function Prior Level of Function : Needs assist ?  ?  ?  ?  ?  ?  ?Mobility Comments: was ambulatory with a walker up until ~1-2 weeks ago (per husband). per chart review (last OT note in 07/2021)-Min A for  standing/ambulation ?ADLs Comments: required assistance for bathing, dressing ?  ? ? ?Hand Dominance  ?   ? ?  ?Extremity/Trunk Assessment  ? Upper Extremity Assessment ?Upper Extremity Assessment: Generalized weakness ?  ? ?Lower Extremity Assessment ?Lower Extremity Assessment: Generalized weakness (bil LE weakness: strength ~2+/5 to 3-/5 (difficulty with completeness/accuracy of MMT 2* cognition)) ?  ? ?   ?Communication  ? Communication: Expressive difficulties  ?Cognition Arousal/Alertness: Awake/alert ?Behavior During Therapy: [facility] for tasks assessed/performed ?Overall Cognitive Status: History of cognitive impairments - at baseline ?Area of Impairment: Following commands, Problem solving, Memory, Safety/judgement ?  ?  ?  ?  ?  ?  ?  ?  ?  ?  ?Memory: Decreased short-term memory ?Following Commands: Follows one step commands inconsistently ?Safety/Judgement: Decreased awareness of safety ?  ?Problem Solving: Slow processing, Decreased initiation, Difficulty sequencing, Requires verbal cues, Requires tactile cues ?  ?  ?  ? ?  ?General Comments   ? ?  ?Exercises    ? ?Assessment/Plan  ?  ?PT Assessment Patient needs continued PT services  ?PT Problem List Decreased strength;Decreased activity tolerance;Decreased balance;Decreased mobility;Decreased coordination;Decreased cognition;Decreased safety awareness;Decreased knowledge of use of DME ? ?   ?  ?PT Treatment Interventions DME instruction;Therapeutic activities;Therapeutic exercise;Patient/family education;Balance training;Functional mobility training   ? ?PT Goals (Current goals can be found in the Care Plan section)  ?Acute Rehab PT Goals ?Patient Stated Goal: to go home (husband prefers home but is open to rehab) ?PT Goal Formulation: With family ?Time For Goal Achievement: 10/13/21 ?Potential to Achieve Goals: Fair ? ?  ?Frequency Min 2X/week ?  ? ? ?Co-evaluation   ?  ?  ?  ?  ? ? ?  ?AM-PAC PT "6 Clicks" Mobility  ?Outcome Measure Help needed turning  from your back to your side while in a flat bed without using bedrails?: A Lot ?Help needed moving from lying on your back to sitting on the side of a flat bed without using bedrails?: A Lot ?Help needed moving to and from a bed to a chair (including a wheelchair)?: Total ?Help needed standing up from a chair using your arms (e.g., wheelchair or bedside chair)?: Total ?Help needed to walk in hospital room?: Total ?Help needed climbing 3-5 steps with a railing? : Total ?6 Click Score: 8 ? ?  ?End of Session   ?Activity Tolerance: Patient limited by fatigue ?Patient left: in bed;with call bell/phone within reach;with family/visitor present ?  ?PT Visit Diagnosis: Muscle weakness (generalized) (M62.81);Difficulty in walking, not elsewhere classified (R26.2) ?  ? ?Time: 1435-1501 ?PT Time Calculation (min) (ACUTE ONLY): 26 min ? ? ?Charges:   PT Evaluation ?$PT Eval Moderate Complexity: 1 Mod ?  ?  ?   ? ? ? ? ? ?Milisa Kimbell P, PT ?Acute Rehabilitation  ?Office: [phone] ?Pager: [phone] ? ?  ? ?

## 2021-09-29 NOTE — ED Notes (Addendum)
Please call patient's husband in April with any updates with rehab and approval of insurance.  ?

## 2021-09-29 NOTE — Progress Notes (Signed)
Transition of Care [facility]) - Emergency Department Mini Assessment ? ? ?Patient Details  ?Name: Debra Huber ?MRN: [id number] ?Date of Birth: 09-12-1950 ? ?Transition of Care (TOC) CM/SW Contact:    ?Arlie Solomons Julee Stoll, LCSW ?Phone Number: ?09/29/2021, 11:53 AM ? ? ?Clinical Narrative: ?TOC CSW spoke with pt's spouse Albin Felling and daughter April [place]. They stated pt has declined in functioning for the last two weeks. Pt's daughter April reported pt was able to walk with her walker and some assistance from Mr king two weeks ago. Pt's daughter reported pt has [facility] services through [place]. Pt's daughter and spouse reported pt ?needs more help with building her strength up.? They are requesting SNF placement. CSW explained the process for SNF placement through the ER. CSW inform pt's daughter and spouse SNF would need to be recommended by PT and approved by pt's insurance company. PT to see pt and then follow the recommendations. TOC to follow. ? ? ? ?ED Mini Assessment: ?What brought you to the Emergency Department? : Failure To Thrive ? ?  ? ?  ? ?  ? ?Interventions which prevented an admission or readmission: SNF Placement, Home Health Consult or Services ? ? ? ?Patient Contact and Communications ?  ?  ?  ? ,     ?  ?  ? ?  ?  ?  ? ?Admission diagnosis:  Failure to Thrive  ?Patient Active Problem List  ? Diagnosis Date Noted  ? AKI (acute kidney injury) ([place]) 08/04/2021  ? Failure to thrive in adult 08/04/2021  ? Hypotension 08/04/2021  ? UTI (urinary tract infection) 08/04/2021  ? Memory impairment 12/20/2018  ? Malignant neoplasm of thyroid gland ([place]) 10/22/2013  ? Lung nodule seen on imaging study 06/26/2013  ? 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 thyroid cancer s/p excision  04/11/2012  ? Hypertension 07/20/2011  ? Malignant neoplasm of colon ([place]) 07/12/2011  ? ?PCP:  Willey Blade, MD ?Pharmacy:   ?[facility] ?[place] ?[place] [address] ?Phone: [phone] Fax: [phone] ? ?[facility] ?[place] ?[place] [address] ?Phone: [phone] Fax: [phone] ? ?[place], [place] - [address] ?[address] ?[place] [address] ?Phone: [phone] Fax: [phone] ?  ?

## 2021-09-29 NOTE — Progress Notes (Signed)
SLP swallow evaluation completed, full report to follow.  Pt xerostomic and alert/cooperative with delayed responses, oral care provided including brushing her teeth and providing oral suction.  She has erythemia on gums - near dental line - But denies pain.  She does have h/o coughing on water at times and orally pocketing foods per family.   Pt able to self feed with 4 ounces apple juice, 4 ounces orange juice, 1 ounce thin water, graham cracker and applesauce.  She takes very small solid boluses - but was able to adequately orally transit and clear without retention.  Cough noted after swallow of thin water via cup - not tsp.  Per spouse and step-daughter, pt has poor intake - including liquids at home.  The deny her having any pneumonias. SLP discussed general dysphagia and aspiration precautions using teach back and written instructions. Recommend dys3/thin. Advised family use protein shakes, etc if helps pt to get po intake and recommended follow up with PCP for poor intake.   ? ?Debra Lime, MS [facility] SLP ?Acute Rehab Services ?Office [phone] ?Pager [phone] ? ?

## 2021-09-29 NOTE — [place] FL2 (Signed)
?[place] MEDICAID FL2 LEVEL OF CARE SCREENING TOOL  ?  ? ?IDENTIFICATION  ?Patient Name: ?Debra Huber [place] Birthdate: 11-03-1950 Sex: female Admission Date (Current Location): ?09/28/2021  ?[place] and [place] Number: ? [place] ?  Facility and Address:  ?[facility],  [place], [place] ?     Provider Number: ?[id number]  ?Attending Physician Name and Address:  ?Default, Provider, MD ? Relative Name and Phone Number:  ?Albin Felling [phone] ?   ?Current Level of Care: ?SNF Recommended Level of Care: ?[place] Prior Approval Number: ?  ? ?Date Approved/Denied: ?  PASRR Number: ?[id number] A ? ?Discharge Plan: ?SNF ?  ? ?Current Diagnoses: ?Patient Active Problem List  ? Diagnosis Date Noted  ? AKI (acute kidney injury) ([place]) 08/04/2021  ? Failure to thrive in adult 08/04/2021  ? Hypotension 08/04/2021  ? UTI (urinary tract infection) 08/04/2021  ? Memory impairment 12/20/2018  ? 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 Lung nodule seen on imaging study 06/26/2013  ? 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 thyroid cancer s/p excision  04/11/2012  ? Hypertension 07/20/2011  ? Malignant neoplasm of colon ([place]) 07/12/2011  ? ? ?Orientation RESPIRATION BLADDER Height & Weight   ?  ?Self ? Normal Incontinent Weight: 138 lb (62.6 kg) ?Height:  '5\' 7"'$  (170.2 cm)  ?BEHAVIORAL SYMPTOMS/MOOD NEUROLOGICAL BOWEL NUTRITION STATUS  ?    Incontinent Diet  ?AMBULATORY STATUS COMMUNICATION OF NEEDS Skin   ?Extensive Assist Verbally Normal ?  ?  ?  ?    ?     ?     ? ? ?Personal Care Assistance Level of Assistance  ?Bathing, Feeding, Dressing Bathing Assistance: Limited assistance ?Feeding assistance: Independent ?Dressing Assistance: Limited assistance ?   ? ?Functional Limitations Info  ?Sight, Hearing, Speech Sight Info: Adequate ?Hearing Info: Adequate ?Speech Info: Adequate  ? ? ?SPECIAL CARE FACTORS FREQUENCY  ?PT (By licensed PT), OT (By licensed OT)   ?  ?PT  Frequency: 5 x a week ?OT Frequency: 5 x a week ?  ?  ?  ?   ? ? ?Contractures Contractures Info: Not present  ? ? ?Additional Factors Info  ?Code Status, Allergies Code Status Info: full ?Allergies Info: Penicillins ?  ?  ?  ?   ? ?Current Medications (09/29/2021):  This is the current hospital active medication list ?No current facility-administered medications for this encounter.  ? ?Current Outpatient Medications  ?Medication Sig Dispense Refill  ? amLODipine (NORVASC) 5 MG tablet Take 1 tablet (5 mg total) by mouth daily. 90 tablet 3  ? aspirin EC 81 MG tablet Take 81 mg by mouth in the morning. Swallow whole.    ? donepezil (ARICEPT) 10 MG tablet Take 10 mg by mouth daily.    ? NP THYROID 90 MG tablet Take 90 mg by mouth daily before breakfast.    ? rosuvastatin (CRESTOR) 20 MG tablet Take 20 mg by mouth every evening.    ? valsartan-hydrochlorothiazide (DIOVAN-HCT) 320-25 MG tablet Take 1 tablet by mouth daily in the afternoon.    ? amitriptyline (ELAVIL) 25 MG tablet Take 1 tablet (25 mg total) by mouth at bedtime. (Patient not taking: Reported on 09/28/2021) 90 tablet 3  ? cloNIDine (CATAPRES) 0.1 MG tablet Take 1 tablet (0.1 mg total) by mouth 2 (two) times daily. (Patient not taking: Reported on 09/28/2021) 60 tablet 0  ? levothyroxine (SYNTHROID) 112 MCG tablet Take 1 tablet (112 mcg total) by mouth daily before breakfast. (  Patient not taking: Reported on 09/28/2021) 30 tablet 0  ? ? ? ?Discharge Medications: ?Please see discharge summary for a list of discharge medications. ? ?Relevant Imaging Results: ? ?Relevant Lab Results: ? ? ?Additional Information ?SSN [id number] ? ?Carmeron Heady M Davey Limas, LCSW ? ? ? ? ?

## 2021-09-29 NOTE — ED Provider Notes (Signed)
Called to bedside by RN, patient's family at bedside are concerned as they walked in and saw food tray at her bed.  Patient reportedly has been pocketing food over the last week or so and they have only been feeding her boost shakes.  Consult placed for speech evaluation, will make n.p.o. until then. ?  ?Garald Balding, PA-C ?09/29/21 1015 ? ?  ?Jeanell Sparrow, DO ?09/29/21 1615 ? ?

## 2021-09-29 NOTE — Progress Notes (Signed)
Clinical/Bedside Swallow Evaluation ?Patient Details  ?Name: Debra Huber ?MRN: [id number] ?Date of Birth: [birth year] ? ?Today's Date: 09/29/2021 ?Time: SLP Start Time (ACUTE ONLY): 1517 SLP Stop Time (ACUTE ONLY): 1340 ?SLP Time Calculation (min) (ACUTE ONLY): 62 min ? ?Past Medical History:  ?Past Medical History:  ?Diagnosis Date  ? Anemia   ? Blood transfusion without reported diagnosis   ? Colon cancer ([place]) dx'd 11/2009  ? S/P HEMI-COLECTOMY AND FINISHED CHEMO 11/24/10 -DR. ODOGWU  ? Complication of anesthesia   ? ALWAYS FEELS VERY SLEEPY WAKING UP  ? Hypertension   ? Hyperthyroidism   ? GRAVES DISEASE  ? Thyroid disease   ? ?Past Surgical History:  ?Past Surgical History:  ?Procedure Laterality Date  ? COLECTOMY    ? partial  ? HEMATOMA EVACUATION  02/26/2012  ? Procedure: EVACUATION HEMATOMA;  Surgeon: Pedro Earls, MD;  Location: [facility];  Service: General;  Laterality: N/A;  irrigation and drainage of hematoma and placement of drain.  ? NEEDLE BX THYROID    ? PORT-A-CATH REMOVAL  11/04/11  ? THYROIDECTOMY  02/25/2012  ? Procedure: THYROIDECTOMY;  Surgeon: Pedro Earls, MD;  Location: [facility];  Service: General;;  ? ?HPI:  ?Pt is a 71 yo female adm to [facility] with AMS, progressive weakness, poor po intake.  Pt with progressive decline at home per notes.  Also with oral pocketing food at home, npo til slp per order.  Pt with h/o remote basal ganglia cva, right mucosal retention in RUL, thyroid and colon ca, h/o potential pna.  Per notes, pt is disorinted and family is at bedside. Per Education officer, museum notes, family wants pt to go to SNF to get stronger.  ?  ?Assessment / Plan / Recommendation  ?Clinical Impression ? Patient presents with suspected multifactorial dysphagia with potential aspiration of thin water - ? due to premature spillage into airway.  NO clinical indications of aspiration or dysphagia with small amounts pt would accept of cracker, applesauce, nectar juice, thin apple juice plain orange  juice. -  She as able to feed herself with set up and at times hand over hand assist.  Pt does appear with questionable lingual fasciculations on left lateral tongue.  She inconsistently followed directions and voice is mildly weak.  As pt with oral pocketing at home and coughing with water - recommend dys3/thin - using applejuice, etc- slightly thicker drinks. Extensive education completed with pt's spouse and his daughter as well as providing toothbrush, toothettes, etc.  Will follow up for dysphagia management given family wished for SLP to stay to see pt with meal. Otherwise if pt to dc to SNF, recommend follow up SLP.  Given pt with dehydration, poor po prior to admit including liquids and no pneumonias, recommend diet as advised. ?SLP Visit Diagnosis: Dysphagia, oropharyngeal phase (R13.12);Dysphagia, unspecified (R13.10) ?   ?Aspiration Risk ? Mild aspiration risk  ?  ?Diet Recommendation Dysphagia 3 (Mech soft);Thin liquid;Nectar-thick liquid  ? ?Supervision: Staff to assist with self feeding (or family) ?Compensations: Slow rate;Small sips/bites;Lingual sweep for clearance of pocketing ?Postural Changes: Seated upright at 90 degrees;Remain upright for at least 30 minutes after po intake  ?  ?Other  Recommendations Oral Care Recommendations: Other (Comment) (after meals) ?Other Recommendations: Have oral suction available   ? ?Recommendations for follow up therapy are one component of a multi-disciplinary discharge planning process, led by the attending physician.  Recommendations may be updated based on patient status, additional functional criteria and insurance authorization. ? ?Follow  up Recommendations Home health SLP  ? ? ?  ?Assistance Recommended at Discharge Frequent or constant Supervision/Assistance  ?Functional Status Assessment Patient has had a recent decline in their functional status and demonstrates the ability to make significant improvements in function in a reasonable and predictable  amount of time.  ?Frequency and Duration min 1 x/week  ?1 week ?  ?   ? ?Prognosis Prognosis for Safe Diet Advancement: Good  ? ?  ? ?Swallow Study   ?General HPI: Pt is a 71 yo female adm to [facility] with AMS, progressive weakness, poor po intake.  Pt with progressive decline at home per notes.  Also with oral pocketing food at home, npo til slp per order.  Pt with h/o remote basal ganglia cva, right mucosal retention in RUL, thyroid and colon ca, h/o potential pna.  Per notes, pt is disorinted and family is at bedside. Per Education officer, museum notes, family wants pt to go to SNF to get stronger. ?Type of Study: Bedside Swallow Evaluation ?Diet Prior to this Study: NPO ?Temperature Spikes Noted: No ?Respiratory Status: Room air ?History of Recent Intubation: No ?Behavior/Cognition: Alert;Cooperative;Pleasant mood ?Oral Cavity Assessment: Dry;Dried secretions ?Oral Care Completed by SLP: No ?Oral Cavity - Dentition: Adequate natural dentition ?Self-Feeding Abilities: Able to feed self;Needs set up;Needs assist ?Patient Positioning: Upright in bed ?Baseline Vocal Quality: Low vocal intensity ?Volitional Cough: Weak ?Volitional Swallow: Unable to elicit  ?  ?Oral/Motor/Sensory Function Overall Oral Motor/Sensory Function: Generalized oral weakness (pt did not follow directions) ?Facial ROM: Within Functional Limits ?Facial Symmetry: Other (Comment) ?Facial Strength: Within Functional Limits ?Facial Sensation: Other (Comment) ?Lingual Strength: Reduced;Suspected CN XII (hypoglossal) dysfunction (? some lingual fasciculations on left lateral tongue) ?Velum: Within Functional Limits ?Mandible: Within Functional Limits   ?Ice Chips Ice chips: Not tested (pt does not like ice)   ?Thin Liquid Thin Liquid: Impaired ?Presentation: Cup;Self Fed;Spoon;Straw ?Oral Phase Impairments:  (clinical delay in swallow, suspected oral) ?Pharyngeal  Phase Impairments: Cough - Immediate  ?  ?Nectar Thick Nectar Thick Liquid: Within functional limits  (slightly thicker than water) ?Presentation: Self Fed;Cup;Spoon;Straw   ?Honey Thick Honey Thick Liquid: Not tested   ?Puree Puree: Within functional limits (pt takes small boluses) ?Presentation: Self Fed;Spoon   ?Solid ? ? ?  Solid: Impaired ?Presentation: Self Fed ?Oral Phase Impairments: Impaired mastication;Reduced lingual movement/coordination (minimally prolonged mastication)  ? ?  ? ?Macario Golds ?09/29/2021,3:04 PM ? ?Kathleen Lime, MS CCC SLP ?Acute Rehab Services ?Office [phone] ?Pager [phone] ? ? ? ?

## 2021-09-30 NOTE — Discharge Instructions (Signed)
Follow-up with primary care doctor.  Come back to ER as needed. ?

## 2021-09-30 NOTE — ED Notes (Signed)
Patient DC d off unit to home per provider. Patient  alert, cooperative, no s/s of distress. Patient off unit on stretcher. Patient transported by [facility] ?

## 2021-09-30 NOTE — ED Provider Notes (Signed)
Emergency Medicine Observation Re-evaluation Note ? ?Debra Huber is a 71 y.o. female, seen on rounds today.  Pt initially presented to the ED for complaints of Failure To Thrive ?Currently, the patient is resting, no distress. ? ?Physical Exam  ?BP (!) 137/95   Pulse 90   Temp 98.4 ?F (36.9 ?C)   Resp 16   Ht '5\' 7"'$  (1.702 m)   Wt 62.6 kg   SpO2 99%   BMI 21.61 kg/m?  ?Physical Exam ?General: no distress ?Cardiac: warm well perfused ?Lungs: even unlabored ?Psych: calm ? ?ED Course / MDM  ?EKG:  ? ?I have reviewed the labs performed to date as well as medications administered while in observation.  Recent changes in the last 24 hours include no acute changes, PT eval rec STR at SNF, SW working on dispo. ? ?Plan  ?Current plan is for placement per TOC. ? Debra Huber is not under involuntary commitment. ? ? ?  ?Lucrezia Starch, MD ?09/30/21 [phone] ? ?

## 2021-09-30 NOTE — Progress Notes (Addendum)
CSW spoke with pt's family decided with [facility] CSW to submit for authorization.  ? ? ? ?Adden ?12:04pm Auth approved  [id number] pt to d/c to [facility].  ? ?Arlie Solomons.Ashton Belote, MSW, [place] ?[facility]  Transitions of Care ?Clinical Social Worker I ?Direct Dial: [phone]  Fax: [phone] ?[email or web address]  ?

## 2021-10-01 LAB — PATHOLOGIST SMEAR REVIEW

## 2021-10-07 ENCOUNTER — Emergency Department (HOSPITAL_COMMUNITY)
Admission: EM | Admit: 2021-10-07 | Discharge: 2021-10-07 | Disposition: A | Discharge status: Home / Self Care | Payer: Medicare Other | Attending: Emergency Medicine | Admitting: Emergency Medicine

## 2021-10-07 ENCOUNTER — Emergency Department (HOSPITAL_COMMUNITY): Payer: Medicare Other

## 2021-10-07 ENCOUNTER — Other Ambulatory Visit: Payer: Self-pay

## 2021-10-07 ENCOUNTER — Encounter (HOSPITAL_COMMUNITY): Payer: Self-pay | Admitting: Emergency Medicine

## 2021-10-07 DIAGNOSIS — Z79899 Other long term (current) drug therapy: Secondary | ICD-10-CM | POA: Insufficient documentation

## 2021-10-07 DIAGNOSIS — M79672 Pain in left foot: Secondary | ICD-10-CM | POA: Diagnosis not present

## 2021-10-07 DIAGNOSIS — S0990XA Unspecified injury of head, initial encounter: Secondary | ICD-10-CM | POA: Insufficient documentation

## 2021-10-07 DIAGNOSIS — E860 Dehydration: Secondary | ICD-10-CM | POA: Insufficient documentation

## 2021-10-07 DIAGNOSIS — I10 Essential (primary) hypertension: Secondary | ICD-10-CM | POA: Insufficient documentation

## 2021-10-07 DIAGNOSIS — M25552 Pain in left hip: Secondary | ICD-10-CM | POA: Diagnosis not present

## 2021-10-07 DIAGNOSIS — Z85038 Personal history of other malignant neoplasm of large intestine: Secondary | ICD-10-CM | POA: Insufficient documentation

## 2021-10-07 DIAGNOSIS — Z7982 Long term (current) use of aspirin: Secondary | ICD-10-CM | POA: Diagnosis not present

## 2021-10-07 DIAGNOSIS — W01198A Fall on same level from slipping, tripping and stumbling with subsequent striking against other object, initial encounter: Secondary | ICD-10-CM | POA: Diagnosis not present

## 2021-10-07 DIAGNOSIS — W19XXXA Unspecified fall, initial encounter: Secondary | ICD-10-CM

## 2021-10-07 LAB — COMPREHENSIVE METABOLIC PANEL
ALT: 29 U/L (ref 0–44)
AST: 39 U/L (ref 15–41)
Albumin: 3 g/dL — ABNORMAL LOW (ref 3.5–5.0)
Alkaline Phosphatase: 84 U/L (ref 38–126)
Anion gap: 9 (ref 5–15)
BUN: 32 mg/dL — ABNORMAL HIGH (ref 8–23)
CO2: 26 mmol/L (ref 22–32)
Calcium: 7.9 mg/dL — ABNORMAL LOW (ref 8.9–10.3)
Chloride: 107 mmol/L (ref 98–111)
Creatinine, Ser: 1.01 mg/dL — ABNORMAL HIGH (ref 0.44–1.00)
GFR, Estimated: >60 mL/min (ref >60)
Glucose, Bld: 104 mg/dL — ABNORMAL HIGH (ref 70–99)
Potassium: 3.4 mmol/L — ABNORMAL LOW (ref 3.5–5.1)
Sodium: 142 mmol/L (ref 135–145)
Total Bilirubin: 0.8 mg/dL (ref 0.3–1.2)
Total Protein: 7.3 g/dL (ref 6.5–8.1)

## 2021-10-07 LAB — CBC WITH DIFFERENTIAL/PLATELET
Abs Immature Granulocytes: 0 10*3/uL (ref 0.00–0.07)
Basophils Absolute: 0 10*3/uL (ref 0.0–0.1)
Basophils Relative: 0 %
Eosinophils Absolute: 0 10*3/uL (ref 0.0–0.5)
Eosinophils Relative: 0 %
HCT: 36.5 % (ref 36.0–46.0)
Hemoglobin: 11.5 g/dL — ABNORMAL LOW (ref 12.0–15.0)
Lymphocytes Relative: 8 %
Lymphs Abs: 0.3 10*3/uL — ABNORMAL LOW (ref 0.7–4.0)
MCH: 25.8 pg — ABNORMAL LOW (ref 26.0–34.0)
MCHC: 31.5 g/dL (ref 30.0–36.0)
MCV: 82 fL (ref 80.0–100.0)
Monocytes Absolute: 0.3 10*3/uL (ref 0.1–1.0)
Monocytes Relative: 7 %
Neutro Abs: 3.1 10*3/uL (ref 1.7–7.7)
Neutrophils Relative %: 85 %
Platelets: 149 10*3/uL — ABNORMAL LOW (ref 150–400)
RBC: 4.45 MIL/uL (ref 3.87–5.11)
RDW: 16.2 % — ABNORMAL HIGH (ref 11.5–15.5)
WBC: 3.7 10*3/uL — ABNORMAL LOW (ref 4.0–10.5)
nRBC: 0 % (ref 0.0–0.2)

## 2021-10-07 LAB — I-STAT CHEM 8, ED
BUN: 27 mg/dL — ABNORMAL HIGH (ref 8–23)
Calcium, Ion: 1.06 mmol/L — ABNORMAL LOW (ref 1.15–1.40)
Chloride: 104 mmol/L (ref 98–111)
Creatinine, Ser: 1 mg/dL (ref 0.44–1.00)
Glucose, Bld: 98 mg/dL (ref 70–99)
HCT: 36 % (ref 36.0–46.0)
Hemoglobin: 12.2 g/dL (ref 12.0–15.0)
Potassium: 3.5 mmol/L (ref 3.5–5.1)
Sodium: 143 mmol/L (ref 135–145)
TCO2: 28 mmol/L (ref 22–32)

## 2021-10-07 LAB — TYPE AND SCREEN
ABO/RH(D): O POS
Antibody Screen: NEGATIVE

## 2021-10-07 MED ORDER — SODIUM CHLORIDE 0.9 % IV BOLUS
1000.0000 mL | Freq: Once | INTRAVENOUS | Status: AC
  Administered 2021-10-07: 1000 mL via INTRAVENOUS

## 2021-10-07 NOTE — Discharge Instructions (Signed)
Take Tylenol for pain and drink plenty of fluids. ?

## 2021-10-07 NOTE — ED Provider Notes (Signed)
?[facility] DEPT ?Provider Note ? ? ?CSN: [id number] ?Arrival date & time: 10/07/21  1129 ? ?  ? ?History ? ?Chief Complaint  ?Patient presents with  ? Fall  ? Hip Pain  ? Foot Pain  ? Hypotension  ? ? ?Debra Huber is a 71 y.o. female. ? ?Patient fell and hit her head.  Initially she she complained of hip and ankle pain but when she came to the emergency department she no longer had any pain except for a headache.  Patient has a history of hypertension and colon cancer.  Patient has a history of hypertension ? ?The history is provided by the patient and medical records. No language interpreter was used.  ?Fall ?This is a new problem. The current episode started 6 to 12 hours ago. The problem occurs rarely. The problem has been resolved. Pertinent negatives include no chest pain, no abdominal pain and no headaches. Nothing aggravates the symptoms. Nothing relieves the symptoms. She has tried nothing for the symptoms. The treatment provided no relief.  ?Hip Pain ?Pertinent negatives include no chest pain, no abdominal pain and no headaches.  ?Foot Pain ?Pertinent negatives include no chest pain, no abdominal pain and no headaches.  ? ?  ? ?Home Medications ?Prior to Admission medications   ?Medication Sig Start Date End Date Taking? Authorizing Provider  ?amitriptyline (ELAVIL) 25 MG tablet Take 1 tablet (25 mg total) by mouth at bedtime. ?Patient not taking: Reported on 09/28/2021 09/18/15   [name], MD  ?amLODipine (NORVASC) 5 MG tablet Take 1 tablet (5 mg total) by mouth daily. 09/18/15   [name], MD  ?aspirin EC 81 MG tablet Take 81 mg by mouth in the morning. Swallow whole.    [provider]  ?cloNIDine (CATAPRES) 0.1 MG tablet Take 1 tablet (0.1 mg total) by mouth 2 (two) times daily. ?Patient not taking: Reported on 09/28/2021 08/05/21 09/28/21  Darliss Cheney, MD  ?donepezil (ARICEPT) 10 MG tablet Take 10 mg by mouth daily.    [provider]   ?levothyroxine (SYNTHROID) 112 MCG tablet Take 1 tablet (112 mcg total) by mouth daily before breakfast. ?Patient not taking: Reported on 09/28/2021 08/06/21 09/28/21  Darliss Cheney, MD  ?NP THYROID 90 MG tablet Take 90 mg by mouth daily before breakfast.    [provider]  ?rosuvastatin (CRESTOR) 20 MG tablet Take 20 mg by mouth every evening.    [provider]  ?valsartan-hydrochlorothiazide (DIOVAN-HCT) 320-25 MG tablet Take 1 tablet by mouth daily in the afternoon.    [provider]  ?   ? ?Allergies    ?Penicillins   ? ?Review of Systems   ?Review of Systems  ?Constitutional:  Negative for appetite change and fatigue.  ?HENT:  Negative for congestion, ear discharge and sinus pressure.   ?     Headache  ?Eyes:  Negative for discharge.  ?Respiratory:  Negative for cough.   ?Cardiovascular:  Negative for chest pain.  ?Gastrointestinal:  Negative for abdominal pain and diarrhea.  ?Genitourinary:  Negative for frequency and hematuria.  ?Musculoskeletal:  Negative for back pain.  ?Skin:  Negative for rash.  ?Neurological:  Negative for seizures and headaches.  ?Psychiatric/Behavioral:  Negative for hallucinations.   ? ?Physical Exam ?Updated Vital Signs ?BP 124/79   Pulse 89   Temp (!) 97.4 ?F (36.3 ?C) (Axillary)   Resp 19   SpO2 100%  ?Physical Exam ?Vitals and nursing note reviewed.  ?Constitutional:   ?  Appearance: She is well-developed.  ?HENT:  ?   Head: Normocephalic.  ?   Nose: Nose normal.  ?Eyes:  ?   General: No scleral icterus. ?   Conjunctiva/sclera: Conjunctivae normal.  ?Neck:  ?   Thyroid: No thyromegaly.  ?Cardiovascular:  ?   Rate and Rhythm: Normal rate and regular rhythm.  ?   Heart sounds: No murmur heard. ?  No friction rub. No gallop.  ?Pulmonary:  ?   Breath sounds: No stridor. No wheezing or rales.  ?Chest:  ?   Chest wall: No tenderness.  ?Abdominal:  ?   General: There is no distension.  ?   Tenderness: There is no abdominal tenderness. There is no rebound.   ?Musculoskeletal:     ?   General: Normal range of motion.  ?   Cervical back: Neck supple.  ?Lymphadenopathy:  ?   Cervical: No cervical adenopathy.  ?Skin: ?   Findings: No erythema or rash.  ?Neurological:  ?   Mental Status: She is alert and oriented to person, place, and time.  ?   Motor: No abnormal muscle tone.  ?   Coordination: Coordination normal.  ?Psychiatric:     ?   Behavior: Behavior normal.  ? ? ?ED Results / Procedures / Treatments   ?Labs ?(all labs ordered are listed, but only abnormal results are displayed) ?Labs Reviewed  ?CBC WITH DIFFERENTIAL/PLATELET - Abnormal; Notable for the following components:  ?    Result Value  ? WBC 3.7 (*)   ? Hemoglobin 11.5 (*)   ? MCH 25.8 (*)   ? RDW 16.2 (*)   ? Platelets 149 (*)   ? Lymphs Abs 0.3 (*)   ? All other components within normal limits  ?COMPREHENSIVE METABOLIC PANEL - Abnormal; Notable for the following components:  ? Potassium 3.4 (*)   ? Glucose, Bld 104 (*)   ? BUN 32 (*)   ? Creatinine, Ser 1.01 (*)   ? Calcium 7.9 (*)   ? Albumin 3.0 (*)   ? All other components within normal limits  ?I-STAT CHEM 8, ED - Abnormal; Notable for the following components:  ? BUN 27 (*)   ? Calcium, Ion 1.06 (*)   ? All other components within normal limits  ?TYPE AND SCREEN  ?ABO/RH  ? ? ?EKG ?EKG Interpretation ? ?Date/Time:  Wednesday October 07 2021 13:43:56 EDT ?Ventricular Rate:  82 ?PR Interval:  155 ?QRS Duration: 111 ?QT Interval:  403 ?QTC Calculation: 471 ?R Axis:   65 ?Text Interpretation: Sinus rhythm Atrial premature complex Confirmed by Milton Ferguson [phone]) on 10/07/2021 2:25:38 PM ? ?Radiology ?CT Head Wo Contrast ? ?Result Date: 10/07/2021 ?CLINICAL DATA:  Dizziness, persistent/recurrent, cardiac or vascular cause suspected; Ataxia, cervical trauma EXAM: CT HEAD WITHOUT CONTRAST CT CERVICAL SPINE WITHOUT CONTRAST TECHNIQUE: Multidetector CT imaging of the head and cervical spine was performed following the standard protocol without intravenous  contrast. Multiplanar CT image reconstructions of the cervical spine were also generated. RADIATION DOSE REDUCTION: This exam was performed according to the departmental dose-optimization program which includes automated exposure control, adjustment of the mA and/or kV according to patient size and/or use of iterative reconstruction technique. COMPARISON:  CT head Oct 24, 2013. FINDINGS: CT HEAD FINDINGS Brain: No evidence of acute infarction, hemorrhage, hydrocephalus, extra-axial collection or mass lesion/mass effect. 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 Vascular: No hyperdense vessel identified. Calcific intracranial atherosclerosis. Skull: No acute fracture. Sinuses/Orbits: Visualized  sinuses are clear. Bilateral staphylomas. Other: No mastoid effusions. CT CERVICAL SPINE FINDINGS Alignment: Mild stepwise retrolisthesis C3 on C4, C4 on C5 and C5 on C6, favor degenerative given degenerative changes at these levels. Otherwise, no substantial sagittal subluxation. Skull base and vertebrae: Vertebral body heights are maintained. Soft tissues and spinal canal: No prevertebral fluid or swelling. No visible canal hematoma. Disc levels: Moderate degenerative disease at C2-C3, C3-C4, C4-C5 and C5-C6 with endplate sclerosis/irregularity, disc height loss, and endplate spurring. At least mild to moderate canal stenosis at C3-C4. Upper chest: Visualized lung apices are clear. Other: Evidence of prior thyroidectomy. IMPRESSION: CT head: 1. No evidence of acute intracranial abnormality. 2. Moderate chronic microvascular ischemic disease and remote infarcts. 3. Moderate cerebral atrophy (ICD10-G31.9). CT cervical spine: 1. No evidence of acute fracture or traumatic malalignment. 2. Multilevel degenerative change, detailed above. MRI could further characterize if clinically warranted. Electronically Signed   By: Margaretha Sheffield M.D.   On: 10/07/2021 13:43  ? ?CT  Cervical Spine Wo Contrast ? ?Result Date: 10/07/2021 ?CLINICAL DATA:  Dizziness, persistent/recurrent, cardiac or vascular cause suspected; Ataxia, cervical trauma EXAM: CT HEAD WITHOUT CONTRAST CT CERVICAL SPIN

## 2021-10-07 NOTE — ED Notes (Signed)
PTAR at pt bedside for transfer home ?

## 2021-10-07 NOTE — ED Triage Notes (Signed)
Patient had fall today at [facility] unwitnessed. According to the patient, she hit her head. She now complains of left foot and hip pain. EMS reports weakness, hypotension and weak radial pulses. The patient has had a decrease in oral intake.  ? ? ? ?EMS vitals: ?90/66 BP ?100 HR ?100% SPO2 on 4 L O2 nasal canula ?113 CBG ?

## 2021-10-10 ENCOUNTER — Encounter (HOSPITAL_COMMUNITY): Payer: Self-pay

## 2021-10-10 ENCOUNTER — Emergency Department (HOSPITAL_COMMUNITY): Payer: Medicare Other

## 2021-10-10 ENCOUNTER — Inpatient Hospital Stay (HOSPITAL_COMMUNITY)
Admission: EM | Admit: 2021-10-10 | Discharge: 2021-10-19 | DRG: 640 | Disposition: A | Discharge status: Home Health | Payer: Medicare Other | Attending: Internal Medicine | Admitting: Internal Medicine

## 2021-10-10 ENCOUNTER — Other Ambulatory Visit: Payer: Self-pay

## 2021-10-10 DIAGNOSIS — E876 Hypokalemia: Secondary | ICD-10-CM | POA: Diagnosis not present

## 2021-10-10 DIAGNOSIS — D72818 Other decreased white blood cell count: Secondary | ICD-10-CM | POA: Diagnosis present

## 2021-10-10 DIAGNOSIS — Z79899 Other long term (current) drug therapy: Secondary | ICD-10-CM

## 2021-10-10 DIAGNOSIS — R627 Adult failure to thrive: Secondary | ICD-10-CM | POA: Diagnosis present

## 2021-10-10 DIAGNOSIS — D72819 Decreased white blood cell count, unspecified: Secondary | ICD-10-CM

## 2021-10-10 DIAGNOSIS — N390 Urinary tract infection, site not specified: Secondary | ICD-10-CM | POA: Diagnosis present

## 2021-10-10 DIAGNOSIS — Z8349 Family history of other endocrine, nutritional and metabolic diseases: Secondary | ICD-10-CM

## 2021-10-10 DIAGNOSIS — E860 Dehydration: Secondary | ICD-10-CM | POA: Diagnosis present

## 2021-10-10 DIAGNOSIS — Z9049 Acquired absence of other specified parts of digestive tract: Secondary | ICD-10-CM

## 2021-10-10 DIAGNOSIS — K5289 Other specified noninfective gastroenteritis and colitis: Secondary | ICD-10-CM | POA: Diagnosis present

## 2021-10-10 DIAGNOSIS — B952 Enterococcus as the cause of diseases classified elsewhere: Secondary | ICD-10-CM | POA: Diagnosis present

## 2021-10-10 DIAGNOSIS — D649 Anemia, unspecified: Secondary | ICD-10-CM

## 2021-10-10 DIAGNOSIS — F0390 Unspecified dementia without behavioral disturbance: Secondary | ICD-10-CM

## 2021-10-10 DIAGNOSIS — F0150 Vascular dementia without behavioral disturbance: Secondary | ICD-10-CM | POA: Diagnosis present

## 2021-10-10 DIAGNOSIS — Z79890 Hormone replacement therapy (postmenopausal): Secondary | ICD-10-CM

## 2021-10-10 DIAGNOSIS — Z7982 Long term (current) use of aspirin: Secondary | ICD-10-CM

## 2021-10-10 DIAGNOSIS — I959 Hypotension, unspecified: Secondary | ICD-10-CM | POA: Diagnosis not present

## 2021-10-10 DIAGNOSIS — Z6821 Body mass index (BMI) 21.0-21.9, adult: Secondary | ICD-10-CM

## 2021-10-10 DIAGNOSIS — R911 Solitary pulmonary nodule: Secondary | ICD-10-CM | POA: Diagnosis present

## 2021-10-10 DIAGNOSIS — G9341 Metabolic encephalopathy: Secondary | ICD-10-CM | POA: Diagnosis not present

## 2021-10-10 DIAGNOSIS — R64 Cachexia: Secondary | ICD-10-CM | POA: Diagnosis present

## 2021-10-10 DIAGNOSIS — Z85038 Personal history of other malignant neoplasm of large intestine: Secondary | ICD-10-CM

## 2021-10-10 DIAGNOSIS — E890 Postprocedural hypothyroidism: Secondary | ICD-10-CM | POA: Diagnosis present

## 2021-10-10 DIAGNOSIS — K5730 Diverticulosis of large intestine without perforation or abscess without bleeding: Secondary | ICD-10-CM | POA: Diagnosis present

## 2021-10-10 DIAGNOSIS — E538 Deficiency of other specified B group vitamins: Secondary | ICD-10-CM

## 2021-10-10 DIAGNOSIS — K640 First degree hemorrhoids: Secondary | ICD-10-CM | POA: Diagnosis present

## 2021-10-10 DIAGNOSIS — K5641 Fecal impaction: Secondary | ICD-10-CM | POA: Diagnosis present

## 2021-10-10 DIAGNOSIS — K5900 Constipation, unspecified: Secondary | ICD-10-CM

## 2021-10-10 DIAGNOSIS — I10 Essential (primary) hypertension: Secondary | ICD-10-CM | POA: Diagnosis present

## 2021-10-10 DIAGNOSIS — R54 Age-related physical debility: Secondary | ICD-10-CM | POA: Diagnosis present

## 2021-10-10 DIAGNOSIS — D509 Iron deficiency anemia, unspecified: Secondary | ICD-10-CM

## 2021-10-10 DIAGNOSIS — Z20822 Contact with and (suspected) exposure to covid-19: Secondary | ICD-10-CM | POA: Diagnosis present

## 2021-10-10 DIAGNOSIS — E039 Hypothyroidism, unspecified: Secondary | ICD-10-CM

## 2021-10-10 DIAGNOSIS — Z8249 Family history of ischemic heart disease and other diseases of the circulatory system: Secondary | ICD-10-CM

## 2021-10-10 LAB — CBC WITH DIFFERENTIAL/PLATELET
Abs Immature Granulocytes: 0.02 10*3/uL (ref 0.00–0.07)
Abs Immature Granulocytes: 0.03 10*3/uL (ref 0.00–0.07)
Basophils Absolute: 0 10*3/uL (ref 0.0–0.1)
Basophils Absolute: 0 10*3/uL (ref 0.0–0.1)
Basophils Relative: 0 %
Basophils Relative: 0 %
Eosinophils Absolute: 0 10*3/uL (ref 0.0–0.5)
Eosinophils Absolute: 0 10*3/uL (ref 0.0–0.5)
Eosinophils Relative: 0 %
Eosinophils Relative: 0 %
HCT: 25.5 % — ABNORMAL LOW (ref 36.0–46.0)
HCT: 28.8 % — ABNORMAL LOW (ref 36.0–46.0)
Hemoglobin: 7.8 g/dL — ABNORMAL LOW (ref 12.0–15.0)
Hemoglobin: 9 g/dL — ABNORMAL LOW (ref 12.0–15.0)
Immature Granulocytes: 0 %
Immature Granulocytes: 1 %
Lymphocytes Relative: 19 %
Lymphocytes Relative: 14 %
Lymphs Abs: 0.9 10*3/uL (ref 0.7–4.0)
Lymphs Abs: 0.7 10*3/uL (ref 0.7–4.0)
MCH: 25.8 pg — ABNORMAL LOW (ref 26.0–34.0)
MCH: 25.9 pg — ABNORMAL LOW (ref 26.0–34.0)
MCHC: 30.6 g/dL (ref 30.0–36.0)
MCHC: 31.3 g/dL (ref 30.0–36.0)
MCV: 84.4 fL (ref 80.0–100.0)
MCV: 83 fL (ref 80.0–100.0)
Monocytes Absolute: 0.3 10*3/uL (ref 0.1–1.0)
Monocytes Absolute: 0.4 10*3/uL (ref 0.1–1.0)
Monocytes Relative: 6 %
Monocytes Relative: 7 %
Neutro Abs: 3.5 10*3/uL (ref 1.7–7.7)
Neutro Abs: 4.3 10*3/uL (ref 1.7–7.7)
Neutrophils Relative %: 75 %
Neutrophils Relative %: 78 %
Platelet Morphology: NORMAL
Platelets: 106 10*3/uL — ABNORMAL LOW (ref 150–400)
Platelets: 126 10*3/uL — ABNORMAL LOW (ref 150–400)
RBC: 3.02 MIL/uL — ABNORMAL LOW (ref 3.87–5.11)
RBC: 3.47 MIL/uL — ABNORMAL LOW (ref 3.87–5.11)
RDW: 16.7 % — ABNORMAL HIGH (ref 11.5–15.5)
RDW: 16.8 % — ABNORMAL HIGH (ref 11.5–15.5)
WBC: 4.7 10*3/uL (ref 4.0–10.5)
WBC: 5.4 10*3/uL (ref 4.0–10.5)
nRBC: 0 % (ref 0.0–0.2)
nRBC: 0.4 % — ABNORMAL HIGH (ref 0.0–0.2)

## 2021-10-10 LAB — MAGNESIUM: Magnesium: 1.8 mg/dL (ref 1.7–2.4)

## 2021-10-10 LAB — COMPREHENSIVE METABOLIC PANEL
ALT: 18 U/L (ref 0–44)
AST: 31 U/L (ref 15–41)
Albumin: 2.2 g/dL — ABNORMAL LOW (ref 3.5–5.0)
Alkaline Phosphatase: 76 U/L (ref 38–126)
Anion gap: 5 (ref 5–15)
BUN: 11 mg/dL (ref 8–23)
CO2: 23 mmol/L (ref 22–32)
Calcium: 6.4 mg/dL — CL (ref 8.9–10.3)
Chloride: 114 mmol/L — ABNORMAL HIGH (ref 98–111)
Creatinine, Ser: 0.56 mg/dL (ref 0.44–1.00)
GFR, Estimated: >60 mL/min (ref >60)
Glucose, Bld: 80 mg/dL (ref 70–99)
Potassium: 3 mmol/L — ABNORMAL LOW (ref 3.5–5.1)
Sodium: 142 mmol/L (ref 135–145)
Total Bilirubin: 0.6 mg/dL (ref 0.3–1.2)
Total Protein: 5.7 g/dL — ABNORMAL LOW (ref 6.5–8.1)

## 2021-10-10 LAB — IRON AND TIBC: Iron: 19 ug/dL — ABNORMAL LOW (ref 28–170)

## 2021-10-10 LAB — URINALYSIS, ROUTINE W REFLEX MICROSCOPIC
Bilirubin Urine: NEGATIVE
Glucose, UA: NEGATIVE mg/dL
Hgb urine dipstick: NEGATIVE
Ketones, ur: NEGATIVE mg/dL
Nitrite: NEGATIVE
Protein, ur: NEGATIVE mg/dL
Specific Gravity, Urine: 1.01 (ref 1.005–1.030)
pH: 7 (ref 5.0–8.0)

## 2021-10-10 LAB — RENAL FUNCTION PANEL
Albumin: 2.2 g/dL — ABNORMAL LOW (ref 3.5–5.0)
Anion gap: 6 (ref 5–15)
BUN: 10 mg/dL (ref 8–23)
CO2: 22 mmol/L (ref 22–32)
Calcium: 6.6 mg/dL — ABNORMAL LOW (ref 8.9–10.3)
Chloride: 112 mmol/L — ABNORMAL HIGH (ref 98–111)
Creatinine, Ser: 0.52 mg/dL (ref 0.44–1.00)
GFR, Estimated: >60 mL/min (ref >60)
Glucose, Bld: 79 mg/dL (ref 70–99)
Phosphorus: 3.2 mg/dL (ref 2.5–4.6)
Potassium: 3 mmol/L — ABNORMAL LOW (ref 3.5–5.1)
Sodium: 140 mmol/L (ref 135–145)

## 2021-10-10 LAB — FERRITIN: Ferritin: 759 ng/mL — ABNORMAL HIGH (ref 11–307)

## 2021-10-10 LAB — RETICULOCYTES
Immature Retic Fract: 12.8 % (ref 2.3–15.9)
RBC.: 3.02 MIL/uL — ABNORMAL LOW (ref 3.87–5.11)
Retic Count, Absolute: 29.6 10*3/uL (ref 19.0–186.0)
Retic Ct Pct: 1 % (ref 0.4–3.1)

## 2021-10-10 LAB — TYPE AND SCREEN
ABO/RH(D): O POS
Antibody Screen: NEGATIVE

## 2021-10-10 LAB — PHOSPHORUS: Phosphorus: 3.2 mg/dL (ref 2.5–4.6)

## 2021-10-10 LAB — POC OCCULT BLOOD, ED: Fecal Occult Bld: NEGATIVE

## 2021-10-10 LAB — VITAMIN B12: Vitamin B-12: 631 pg/mL (ref 180–914)

## 2021-10-10 LAB — FOLATE: Folate: 4.3 ng/mL — ABNORMAL LOW (ref >5.9)

## 2021-10-10 LAB — TSH: TSH: 0.956 u[IU]/mL (ref 0.350–4.500)

## 2021-10-10 MED ORDER — SODIUM CHLORIDE 0.9 % IV BOLUS
1000.0000 mL | Freq: Once | INTRAVENOUS | Status: AC
Start: 2021-10-10 — End: 2021-10-10
  Administered 2021-10-10: 1000 mL via INTRAVENOUS

## 2021-10-10 MED ORDER — BOOST / RESOURCE BREEZE PO LIQD CUSTOM
237.0000 mL | Freq: Three times a day (TID) | ORAL | Status: DC
  Administered 2021-10-11 – 2021-10-15 (×10): 1 via ORAL
  Administered 2021-10-16: 237 mL via ORAL
  Administered 2021-10-16 – 2021-10-17 (×3): 1 via ORAL

## 2021-10-10 MED ORDER — LEVOTHYROXINE SODIUM 112 MCG PO TABS
112.0000 ug | ORAL_TABLET | Freq: Every day | ORAL | Status: DC
  Administered 2021-10-11 – 2021-10-18 (×7): 112 ug via ORAL
  Filled 2021-10-10 (×7): qty 1

## 2021-10-10 MED ORDER — SODIUM CHLORIDE (PF) 0.9 % IJ SOLN
INTRAMUSCULAR | Status: AC
  Administered 2021-10-10: 10 mL
  Filled 2021-10-10: qty 50

## 2021-10-10 MED ORDER — ASPIRIN EC 81 MG PO TBEC
81.0000 mg | DELAYED_RELEASE_TABLET | Freq: Every day | ORAL | Status: DC
  Administered 2021-10-11 – 2021-10-18 (×8): 81 mg via ORAL
  Filled 2021-10-10 (×8): qty 1

## 2021-10-10 MED ORDER — CALCIUM GLUCONATE-NACL 1-0.675 GM/50ML-% IV SOLN
1.0000 g | Freq: Once | INTRAVENOUS | Status: AC
  Administered 2021-10-10: 1000 mg via INTRAVENOUS
  Filled 2021-10-10: qty 50

## 2021-10-10 MED ORDER — AMITRIPTYLINE HCL 25 MG PO TABS
25.0000 mg | ORAL_TABLET | Freq: Every day | ORAL | Status: DC
  Administered 2021-10-10 – 2021-10-13 (×4): 25 mg via ORAL
  Filled 2021-10-10 (×4): qty 1

## 2021-10-10 MED ORDER — POTASSIUM CHLORIDE CRYS ER 20 MEQ PO TBCR
40.0000 meq | EXTENDED_RELEASE_TABLET | Freq: Once | ORAL | Status: DC
  Filled 2021-10-10: qty 2

## 2021-10-10 MED ORDER — MIRTAZAPINE 15 MG PO TABS
15.0000 mg | ORAL_TABLET | Freq: Every day | ORAL | Status: DC
  Administered 2021-10-10 – 2021-10-17 (×8): 15 mg via ORAL
  Filled 2021-10-10 (×8): qty 1

## 2021-10-10 MED ORDER — THYROID 60 MG PO TABS
90.0000 mg | ORAL_TABLET | Freq: Every day | ORAL | Status: DC
  Administered 2021-10-11 – 2021-10-19 (×8): 90 mg via ORAL
  Filled 2021-10-10 (×10): qty 1

## 2021-10-10 MED ORDER — POLYETHYLENE GLYCOL 3350 17 G PO PACK
17.0000 g | PACK | Freq: Every day | ORAL | Status: DC
  Administered 2021-10-11 – 2021-10-12 (×2): 17 g via ORAL
  Filled 2021-10-10 (×2): qty 1

## 2021-10-10 MED ORDER — DOCUSATE SODIUM 100 MG PO CAPS
100.0000 mg | ORAL_CAPSULE | Freq: Two times a day (BID) | ORAL | Status: DC
  Administered 2021-10-10 – 2021-10-18 (×16): 100 mg via ORAL
  Filled 2021-10-10 (×16): qty 1

## 2021-10-10 MED ORDER — POTASSIUM CHLORIDE CRYS ER 20 MEQ PO TBCR: 40.0000 meq | EXTENDED_RELEASE_TABLET | Freq: Every day | ORAL | Status: DC

## 2021-10-10 MED ORDER — LACTATED RINGERS IV SOLN: INTRAVENOUS | Status: DC

## 2021-10-10 MED ORDER — NA FERRIC GLUC CPLX IN SUCROSE 12.5 MG/ML IV SOLN
250.0000 mg | Freq: Once | INTRAVENOUS | Status: AC
Start: 2021-10-10 — End: 2021-10-11
  Administered 2021-10-10: 250 mg via INTRAVENOUS
  Filled 2021-10-10: qty 20

## 2021-10-10 MED ORDER — FOLIC ACID 1 MG PO TABS
1.0000 mg | ORAL_TABLET | Freq: Every day | ORAL | Status: DC
  Administered 2021-10-11 – 2021-10-19 (×9): 1 mg via ORAL
  Filled 2021-10-10 (×9): qty 1

## 2021-10-10 MED ORDER — IOHEXOL 300 MG/ML  SOLN
100.0000 mL | Freq: Once | INTRAMUSCULAR | Status: AC | PRN
  Administered 2021-10-10: 100 mL via INTRAVENOUS

## 2021-10-10 MED ORDER — POTASSIUM CHLORIDE 10 MEQ/100ML IV SOLN
10.0000 meq | Freq: Once | INTRAVENOUS | Status: AC
  Administered 2021-10-10: 10 meq via INTRAVENOUS
  Filled 2021-10-10: qty 100

## 2021-10-10 MED ORDER — CALCIUM GLUCONATE-NACL 1-0.675 GM/50ML-% IV SOLN
1.0000 g | Freq: Once | INTRAVENOUS | Status: AC
Start: 2021-10-10 — End: 2021-10-10
  Administered 2021-10-10: 1000 mg via INTRAVENOUS
  Filled 2021-10-10: qty 50

## 2021-10-10 MED ORDER — DONEPEZIL HCL 10 MG PO TABS
10.0000 mg | ORAL_TABLET | Freq: Every evening | ORAL | Status: DC
  Administered 2021-10-10 – 2021-10-18 (×9): 10 mg via ORAL
  Filled 2021-10-10 (×9): qty 1

## 2021-10-10 NOTE — H&P (Signed)
?History and Physical  ? ? ?Patient: Debra Huber MAU:[id number] DOB: 1951/06/09 ?DOA: 10/10/2021 ?DOS: the patient was seen and examined on 10/10/2021 ?PCP: Willey Blade, MD  ?Patient coming from:  [facility] ? ?Chief Complaint:  ?Chief Complaint  ?Patient presents with  ? Dehydration  ? ?HPI: Debra Huber is a 71 y.o. female with medical history significant of dementia, hypothyroidism, HTN. History is from family at bedside. He reports that the patient has had poor appetite for some time now. It seems to have worsened since she's been at [facility]. She is not eating or drinking. She has become weaker and lost weight. She states that she doesn't have an appetite. She does not have any abdominal pain, N/V. When her facility was unable to establish an IV for hydration today, they sent her to the ED for evaluation. ? ?Review of Systems: As mentioned in the history of present illness. All other systems reviewed and are negative. ?Past Medical History:  ?Diagnosis Date  ? Anemia   ? Blood transfusion without reported diagnosis   ? Colon cancer ([place]) dx'd 11/2009  ? S/P HEMI-COLECTOMY AND FINISHED CHEMO 11/24/10 -DR. ODOGWU  ? Complication of anesthesia   ? ALWAYS FEELS VERY SLEEPY WAKING UP  ? Hypertension   ? Hyperthyroidism   ? GRAVES DISEASE  ? Thyroid disease   ? ?Past Surgical History:  ?Procedure Laterality Date  ? COLECTOMY    ? partial  ? HEMATOMA EVACUATION  02/26/2012  ? Procedure: EVACUATION HEMATOMA;  Surgeon: Pedro Earls, MD;  Location: [facility] OR[facility];  Service: General;  Laterality: N/A;  irrigation and drainage of hematoma and placement of drain.  ? NEEDLE BX THYROID    ? PORT-A-CATH REMOVAL  11/04/11  ? THYROIDECTOMY  02/25/2012  ? Procedure: THYROIDECTOMY;  Surgeon: Pedro Earls, MD;  Location: [facility];  Service: General;;  ? ?Social History:  reports that she has never smoked. She has never used smokeless tobacco. She reports that she does not drink alcohol and does not use  drugs. ? ?Allergies  ?Allergen Reactions  ? Penicillins Swelling  ?  Did PCN reaction causing immediate rash, facial/tongue/throat swelling, SOB or lightheadedness with hypotension: Yes ?Did PCN reaction causing severe rash involving mucus membranes or skin necrosis: unknown ?Did PCN reaction that required hospitalization Yes- was in the hospital and i was given penicillin and had a reaction ?Did PCN reaction occurring within the last 10 years: No, about 15 years or more ?If all of the above answers are "NO", then may proceed with Cephalosporin use. ?  ? ? ?Family History  ?Problem Relation Age of Onset  ? Cancer Father   ?     prostat  ? Hypertension Sister   ? Thyroid disease Sister   ? Hypertension Brother   ? Hypertension Sister   ? Thyroid disease Sister   ? Hypertension Sister   ? Hypertension Sister   ? Hypertension Sister   ? Hypertension Sister   ? Hypertension Brother   ? ? ?Prior to Admission medications   ?Medication Sig Start Date End Date Taking? Authorizing Provider  ?amitriptyline (ELAVIL) 25 MG tablet Take 1 tablet (25 mg total) by mouth at bedtime. 09/18/15  Yes [name], MD  ?amLODipine (NORVASC) 5 MG tablet Take 1 tablet (5 mg total) by mouth daily. 09/18/15  Yes [name], MD  ?aspirin EC 81 MG tablet Take 81 mg by mouth in the morning. Swallow whole.   Yes [provider]  ?  cloNIDine (CATAPRES) 0.1 MG tablet Take 0.1 mg by mouth 2 (two) times daily.   Yes [provider]  ?donepezil (ARICEPT) 10 MG tablet Take 10 mg by mouth every evening.   Yes [provider]  ?levothyroxine (SYNTHROID) 112 MCG tablet Take 112 mcg by mouth daily before breakfast.   Yes [provider]  ?mirtazapine (REMERON) 15 MG tablet Take 15 mg by mouth at bedtime.   Yes [provider]  ?NP THYROID 90 MG tablet Take 90 mg by mouth daily before breakfast.   Yes [provider]  ?Nutritional Supplements (RESOURCE 2.0) LIQD Take 120 mLs by mouth in the morning, at  noon, and at bedtime.   Yes [provider]  ?sodium chloride 0.9 % infusion Inject into the vein as directed. Use 100 ml/hr intravenously X 24 Hours for Dehydration for 2 Days. Give 500 ml bolus, followed by 100 ml/hr x 2 L   Yes [provider]  ?valsartan-hydrochlorothiazide (DIOVAN-HCT) 320-25 MG tablet Take 1 tablet by mouth daily in the afternoon.   Yes [provider]  ?cloNIDine (CATAPRES) 0.1 MG tablet Take 1 tablet (0.1 mg total) by mouth 2 (two) times daily. ?Patient not taking: Reported on 09/28/2021 08/05/21 09/28/21  Darliss Cheney, MD  ?levothyroxine (SYNTHROID) 112 MCG tablet Take 1 tablet (112 mcg total) by mouth daily before breakfast. ?Patient not taking: Reported on 09/28/2021 08/06/21 09/28/21  Darliss Cheney, MD  ? ? ?Physical Exam: ?Vitals:  ? 10/10/21 1500 10/10/21 1545 10/10/21 1600 10/10/21 1630  ?BP: 98/60 122/89 109/74 97/73  ?Pulse: (!) 105 (!) 107 (!) 107 (!) 107  ?Resp: '19 15 15 15  '$ ?Temp:      ?TempSrc:      ?SpO2: 95% 94% 94% 94%  ? ?General: 71 y.o. female resting in bed in NAD ?Eyes: PERRL, normal sclera ?ENMT: Nares patent w/o discharge, orophaynx clear, dentition normal, ears w/o discharge/lesions/ulcers ?Neck: Supple, trachea midline ?Cardiovascular: RRR, +S1, S2, no m/g/r, equal pulses throughout ?Respiratory: CTABL, no w/r/r, normal WOB ?GI: BS+, NDNT, no masses noted, no organomegaly noted ?MSK: No e/c/c ?Neuro: A&O x 3, no focal deficits ?Psyc: Flat affect, calm/cooperative ? ?Data Reviewed: ? ?K+ 3.0 ?Ca2+  6.4 ?Albumin 2.2 ?Fe2+  19 ?Folate  4.3 ?WBC  5.4 ?Hgb 9.0 ? ?CT chest/ab/pelvis ?1. Large amount of rectal stool (question impacted) with mild adjacent circumferential rectal wall thickening and perirectal stranding - question stercoral colitis. No evidence of bowel obstruction, pneumoperitoneum or abscess. ?2. 10 mm RIGHT UPPER lobe nodule and may represent malignancy/metastatic disease given patient history. Recommend elective PET-CT for further  evaluation. ?3. Mild to moderate bilateral LOWER lobe and lingular atelectasis, greatest in the LEFT LOWER lobe. ?4. Coronary artery disease. ?5. Aortic Atherosclerosis (ICD10-I70.0). ? ?Assessment and Plan: ?No notes have been filed under this hospital service. ?Service: Hospitalist ?Hypokalemia ?Hypocalcemia ?    - replace K+/Ca2+; Mg2+ is ok ? ?Dehydration ?Poor PO intake ?aFTT ?    - fluids ?    - dietitian consult ?    - encourage diet; continue remeron ? ?Constipation ?    - bowel regimen ?    - CT as above, large BM noted in ED ? ?Iron deficiency anemia ?    - Replace iron ? ?Folate deficiency ?    - replace folate ? ?Hypothyroidism ?    - TSH ok; continue home regimen ? ?Dementia ?    - continue home regimen ? ?HTN ?    - pressures are  soft; hold her home regimen for now ? ? Advance Care Planning:   Code Status: FULL ? ?Consults: None ? ?Family Communication: w/ family at bedside ? ?Severity of Illness: ?The appropriate patient status for this patient is OBSERVATION. Observation status is judged to be reasonable and necessary in order to provide the required intensity of service to ensure the patient's safety. The patient's presenting symptoms, physical exam findings, and initial radiographic and laboratory data in the context of their medical condition is felt to place them at decreased risk for further clinical deterioration. Furthermore, it is anticipated that the patient will be medically stable for discharge from the hospital within 2 midnights of admission.  ? ?Author: ?Jonnie Finner, DO ?10/10/2021 5:09 PM ? ?For on call review [email or web address].  ?

## 2021-10-10 NOTE — ED Provider Notes (Signed)
?[facility] DEPT ?Provider Note ? ? ?CSN: [id number] ?Arrival date & time: 10/10/21  1217 ? ?  ? ?History ? ?Chief Complaint  ?Patient presents with  ? Dehydration  ? ? ?Debra Huber is a 71 y.o. female with a past medical history of hypertension, hypothyroidism, memory impairment and remote colon cancer status post hemicolectomy 2012 presenting today due to dehydration.  Patient's husband notes that she has been rapid the same a.m. refusing to eat and drink.  When she is asked about this she states that she just does not have an appetite.  She lives at [facility] and they are unable to get IV access due to her dehydration.  She denies any pain, dizziness, difficulty breathing, abdominal pain, nausea, vomiting, diarrhea or other recent illness.  Patient's husband says that ever since they went to [facility] in August 2022 she has been losing weight and not eating or drinking as much. ? ?HPI ? ?  ? ?Home Medications ?Prior to Admission medications   ?Medication Sig Start Date End Date Taking? Authorizing Provider  ?amitriptyline (ELAVIL) 25 MG tablet Take 1 tablet (25 mg total) by mouth at bedtime. ?Patient not taking: Reported on 09/28/2021 09/18/15   [name], MD  ?amLODipine (NORVASC) 5 MG tablet Take 1 tablet (5 mg total) by mouth daily. 09/18/15   [name], MD  ?aspirin EC 81 MG tablet Take 81 mg by mouth in the morning. Swallow whole.    [provider]  ?cloNIDine (CATAPRES) 0.1 MG tablet Take 1 tablet (0.1 mg total) by mouth 2 (two) times daily. ?Patient not taking: Reported on 09/28/2021 08/05/21 09/28/21  Darliss Cheney, MD  ?donepezil (ARICEPT) 10 MG tablet Take 10 mg by mouth daily.    [provider]  ?levothyroxine (SYNTHROID) 112 MCG tablet Take 1 tablet (112 mcg total) by mouth daily before breakfast. ?Patient not taking: Reported on 09/28/2021 08/06/21 09/28/21  Darliss Cheney, MD  ?NP THYROID 90 MG tablet Take 90 mg by mouth daily before  breakfast.    [provider]  ?rosuvastatin (CRESTOR) 20 MG tablet Take 20 mg by mouth every evening.    [provider]  ?valsartan-hydrochlorothiazide (DIOVAN-HCT) 320-25 MG tablet Take 1 tablet by mouth daily in the afternoon.    [provider]  ?   ? ?Allergies    ?Penicillins   ? ?Review of Systems   ?Review of Systems ? ?Physical Exam ?Updated Vital Signs ?BP 95/77   Pulse (!) 105   Temp 97.6 ?F (36.4 ?C) (Oral)   Resp 14   SpO2 95%  ?Physical Exam ?Vitals and nursing note reviewed.  ?Constitutional:   ?   Appearance: Normal appearance. She is ill-appearing (Chronically ill-appearing.  Cachectic).  ?HENT:  ?   Head: Normocephalic and atraumatic.  ?   Mouth/Throat:  ?   Mouth: Mucous membranes are dry.  ?Eyes:  ?   General: No scleral icterus. ?   Conjunctiva/sclera: Conjunctivae normal.  ?Cardiovascular:  ?   Rate and Rhythm: Normal rate and regular rhythm.  ?Pulmonary:  ?   Effort: Pulmonary effort is normal. No respiratory distress.  ?Abdominal:  ?   General: Abdomen is flat.  ?   Palpations: Abdomen is soft. There is no mass.  ?   Tenderness: There is no abdominal tenderness. There is no guarding.  ?Genitourinary: ?   Comments: Rectal exam performed in presence of nurse tech.  Patient with large amounts of stool and appears to have  had a bowel movement in the breathing.  Is not melanotic ?Skin: ?   General: Skin is warm and dry.  ?   Coloration: Skin is jaundiced.  ?   Findings: No bruising, erythema or rash.  ?Neurological:  ?   Mental Status: She is alert.  ?Psychiatric:     ?   Mood and Affect: Mood normal.  ? ? ?ED Results / Procedures / Treatments   ?Labs ?(all labs ordered are listed, but only abnormal results are displayed) ?Labs Reviewed  ?URINALYSIS, ROUTINE W REFLEX MICROSCOPIC - Abnormal; Notable for the following components:  ?    Result Value  ? Leukocytes,Ua SMALL (*)   ? Bacteria, UA RARE (*)   ? All other components within normal limits  ?COMPREHENSIVE  METABOLIC PANEL - Abnormal; Notable for the following components:  ? Potassium 3.0 (*)   ? Chloride 114 (*)   ? Calcium 6.4 (*)   ? Total Protein 5.7 (*)   ? Albumin 2.2 (*)   ? All other components within normal limits  ?CBC WITH DIFFERENTIAL/PLATELET - Abnormal; Notable for the following components:  ? RBC 3.02 (*)   ? Hemoglobin 7.8 (*)   ? HCT 25.5 (*)   ? MCH 25.8 (*)   ? RDW 16.7 (*)   ? Platelets 106 (*)   ? All other components within normal limits  ?FOLATE - Abnormal; Notable for the following components:  ? Folate 4.3 (*)   ? All other components within normal limits  ?RETICULOCYTES - Abnormal; Notable for the following components:  ? RBC. 3.02 (*)   ? All other components within normal limits  ?TSH  ?MAGNESIUM  ?PHOSPHORUS  ?VITAMIN B12  ?IRON AND TIBC  ?FERRITIN  ?CBC WITH DIFFERENTIAL/PLATELET  ?POC OCCULT BLOOD, ED  ?TYPE AND SCREEN  ? ? ?EKG ?None ? ?Radiology ?CT CHEST ABDOMEN PELVIS W CONTRAST ? ?Result Date: 10/10/2021 ?CLINICAL DATA:  71 year old female with hypotension, dehydration and recent fall. Anemia and altered labs. Weakness and slow responses. History of colon cancer and treatment over 10 years ago. EXAM: CT CHEST, ABDOMEN, AND PELVIS WITH CONTRAST TECHNIQUE: Multidetector CT imaging of the chest, abdomen and pelvis was performed following the standard protocol during bolus administration of intravenous contrast. RADIATION DOSE REDUCTION: This exam was performed according to the departmental dose-optimization program which includes automated exposure control, adjustment of the mA and/or kV according to patient size and/or use of iterative reconstruction technique. CONTRAST:  170m OMNIPAQUE IOHEXOL 300 MG/ML  SOLN COMPARISON:  10/05/2013 chest CT, 05/09/2012 chest, abdominal and pelvic CT and other studies. FINDINGS: CT CHEST FINDINGS Cardiovascular: Heart size is normal. Coronary artery and aortic atherosclerotic calcifications are again noted. There is no evidence of thoracic aortic  aneurysm/definite dissection or pericardial effusion. Mediastinum/Nodes: No enlarged mediastinal, hilar, or axillary lymph nodes. Calcified mediastinal and hilar lymph nodes are again noted. Trachea and esophagus demonstrate no significant findings. Thyroidectomy changes again noted. Lungs/Pleura: A 10 mm RIGHT UPPER lobe nodule is identified (series 3: Image 29). Mild to moderate bilateral LOWER lobe and lingular atelectasis noted, greatest in the LEFT LOWER lobe. No definite airspace disease noted. No pleural effusion or pneumothorax identified. Musculoskeletal: No acute or suspicious bony abnormalities are noted. CT ABDOMEN PELVIS FINDINGS Hepatobiliary: The liver and gallbladder are unremarkable. There is no evidence of intrahepatic or extrahepatic biliary dilatation. Pancreas: Unremarkable Spleen: Unremarkable Adrenals/Urinary Tract: The kidneys, adrenal glands and bladder are unremarkable. Stomach/Bowel: A large amount of rectal stool is noted with mild adjacent  circumferential rectal wall thickening and perirectal stranding which may represent stercoral colitis. There is no evidence of bowel obstruction. No other definite bowel wall thickening identified. Vascular/Lymphatic: Aortic atherosclerosis. No enlarged abdominal or pelvic lymph nodes. Reproductive: Uterus and bilateral adnexa are unremarkable. Other: A trace amount of free pelvic fluid is noted. Stranding/edema in the gluteal subcutaneous tissues bilaterally again noted. There is no evidence of pneumoperitoneum or focal collection. Musculoskeletal: No acute or suspicious bony abnormalities are noted. Moderate degenerative disc disease and vertebral endplate changes are noted, greatest at L4-5. IMPRESSION: 1. Large amount of rectal stool (question impacted) with mild adjacent circumferential rectal wall thickening and perirectal stranding - question stercoral colitis. No evidence of bowel obstruction, pneumoperitoneum or abscess. 2. 10 mm RIGHT UPPER  lobe nodule and may represent malignancy/metastatic disease given patient history. Recommend elective PET-CT for further evaluation. 3. Mild to moderate bilateral LOWER lobe and lingular atelectasis, greatest in the LEFT LO

## 2021-10-10 NOTE — ED Notes (Signed)
Urine output from [facility]@ 1060ms. ENMiles ?

## 2021-10-10 NOTE — ED Triage Notes (Signed)
EMS reports from [facility], staff reports dehydration. Staff had order for 188m NS per hour continuous, yesterday. Unable to start IV and called EMS for transport to ED. Was here on 26th for same. Husband states Pt at baseline, slow responses. ? ?BP 104/66 ?HR 104 ?RR 20 ?Sp02 98 RA ? ?22ga L hand. ?25100mNS enroute. ?

## 2021-10-11 DIAGNOSIS — E039 Hypothyroidism, unspecified: Secondary | ICD-10-CM | POA: Diagnosis not present

## 2021-10-11 DIAGNOSIS — F0390 Unspecified dementia without behavioral disturbance: Secondary | ICD-10-CM | POA: Diagnosis not present

## 2021-10-11 DIAGNOSIS — Z79899 Other long term (current) drug therapy: Secondary | ICD-10-CM | POA: Diagnosis not present

## 2021-10-11 DIAGNOSIS — K5289 Other specified noninfective gastroenteritis and colitis: Secondary | ICD-10-CM | POA: Diagnosis present

## 2021-10-11 DIAGNOSIS — G9341 Metabolic encephalopathy: Secondary | ICD-10-CM | POA: Diagnosis not present

## 2021-10-11 DIAGNOSIS — I10 Essential (primary) hypertension: Secondary | ICD-10-CM

## 2021-10-11 DIAGNOSIS — K640 First degree hemorrhoids: Secondary | ICD-10-CM | POA: Diagnosis not present

## 2021-10-11 DIAGNOSIS — D509 Iron deficiency anemia, unspecified: Secondary | ICD-10-CM

## 2021-10-11 DIAGNOSIS — Z7189 Other specified counseling: Secondary | ICD-10-CM | POA: Diagnosis not present

## 2021-10-11 DIAGNOSIS — R54 Age-related physical debility: Secondary | ICD-10-CM | POA: Diagnosis present

## 2021-10-11 DIAGNOSIS — Z6821 Body mass index (BMI) 21.0-21.9, adult: Secondary | ICD-10-CM | POA: Diagnosis not present

## 2021-10-11 DIAGNOSIS — N390 Urinary tract infection, site not specified: Secondary | ICD-10-CM | POA: Diagnosis present

## 2021-10-11 DIAGNOSIS — Z7982 Long term (current) use of aspirin: Secondary | ICD-10-CM | POA: Diagnosis not present

## 2021-10-11 DIAGNOSIS — K5730 Diverticulosis of large intestine without perforation or abscess without bleeding: Secondary | ICD-10-CM | POA: Diagnosis not present

## 2021-10-11 DIAGNOSIS — Z8349 Family history of other endocrine, nutritional and metabolic diseases: Secondary | ICD-10-CM | POA: Diagnosis not present

## 2021-10-11 DIAGNOSIS — Z85038 Personal history of other malignant neoplasm of large intestine: Secondary | ICD-10-CM | POA: Diagnosis not present

## 2021-10-11 DIAGNOSIS — E860 Dehydration: Secondary | ICD-10-CM | POA: Diagnosis present

## 2021-10-11 DIAGNOSIS — R64 Cachexia: Secondary | ICD-10-CM | POA: Diagnosis present

## 2021-10-11 DIAGNOSIS — I959 Hypotension, unspecified: Secondary | ICD-10-CM | POA: Diagnosis not present

## 2021-10-11 DIAGNOSIS — Z9049 Acquired absence of other specified parts of digestive tract: Secondary | ICD-10-CM | POA: Diagnosis not present

## 2021-10-11 DIAGNOSIS — E538 Deficiency of other specified B group vitamins: Secondary | ICD-10-CM | POA: Diagnosis not present

## 2021-10-11 DIAGNOSIS — F0150 Vascular dementia without behavioral disturbance: Secondary | ICD-10-CM | POA: Diagnosis present

## 2021-10-11 DIAGNOSIS — K5900 Constipation, unspecified: Secondary | ICD-10-CM | POA: Diagnosis not present

## 2021-10-11 DIAGNOSIS — D72818 Other decreased white blood cell count: Secondary | ICD-10-CM | POA: Diagnosis present

## 2021-10-11 DIAGNOSIS — E890 Postprocedural hypothyroidism: Secondary | ICD-10-CM | POA: Diagnosis present

## 2021-10-11 DIAGNOSIS — E876 Hypokalemia: Secondary | ICD-10-CM | POA: Diagnosis present

## 2021-10-11 DIAGNOSIS — Z8249 Family history of ischemic heart disease and other diseases of the circulatory system: Secondary | ICD-10-CM | POA: Diagnosis not present

## 2021-10-11 DIAGNOSIS — Z79890 Hormone replacement therapy (postmenopausal): Secondary | ICD-10-CM | POA: Diagnosis not present

## 2021-10-11 DIAGNOSIS — R627 Adult failure to thrive: Secondary | ICD-10-CM | POA: Diagnosis present

## 2021-10-11 DIAGNOSIS — R933 Abnormal findings on diagnostic imaging of other parts of digestive tract: Secondary | ICD-10-CM | POA: Diagnosis not present

## 2021-10-11 DIAGNOSIS — Z20822 Contact with and (suspected) exposure to covid-19: Secondary | ICD-10-CM | POA: Diagnosis present

## 2021-10-11 LAB — CBC
HCT: 36 % (ref 36.0–46.0)
Hemoglobin: 10.9 g/dL — ABNORMAL LOW (ref 12.0–15.0)
MCH: 25.8 pg — ABNORMAL LOW (ref 26.0–34.0)
MCHC: 30.3 g/dL (ref 30.0–36.0)
MCV: 85.1 fL (ref 80.0–100.0)
Platelets: 135 10*3/uL — ABNORMAL LOW (ref 150–400)
RBC: 4.23 MIL/uL (ref 3.87–5.11)
RDW: 16.8 % — ABNORMAL HIGH (ref 11.5–15.5)
WBC: 5.1 10*3/uL (ref 4.0–10.5)
nRBC: 0 % (ref 0.0–0.2)

## 2021-10-11 LAB — COMPREHENSIVE METABOLIC PANEL
ALT: 18 U/L (ref 0–44)
AST: 31 U/L (ref 15–41)
Albumin: 2.2 g/dL — ABNORMAL LOW (ref 3.5–5.0)
Alkaline Phosphatase: 83 U/L (ref 38–126)
Anion gap: 9 (ref 5–15)
BUN: 9 mg/dL (ref 8–23)
CO2: 21 mmol/L — ABNORMAL LOW (ref 22–32)
Calcium: 7.1 mg/dL — ABNORMAL LOW (ref 8.9–10.3)
Chloride: 109 mmol/L (ref 98–111)
Creatinine, Ser: 0.46 mg/dL (ref 0.44–1.00)
GFR, Estimated: >60 mL/min (ref >60)
Glucose, Bld: 61 mg/dL — ABNORMAL LOW (ref 70–99)
Potassium: 2.9 mmol/L — ABNORMAL LOW (ref 3.5–5.1)
Sodium: 139 mmol/L (ref 135–145)
Total Bilirubin: 0.8 mg/dL (ref 0.3–1.2)
Total Protein: 5.7 g/dL — ABNORMAL LOW (ref 6.5–8.1)

## 2021-10-11 MED ORDER — IRBESARTAN 150 MG PO TABS
300.0000 mg | ORAL_TABLET | Freq: Every day | ORAL | Status: DC
Start: 2021-10-11 — End: 2021-10-13
  Administered 2021-10-11 – 2021-10-13 (×3): 300 mg via ORAL
  Filled 2021-10-11 (×3): qty 2

## 2021-10-11 MED ORDER — POTASSIUM CHLORIDE 10 MEQ/100ML IV SOLN
10.0000 meq | INTRAVENOUS | Status: AC
  Administered 2021-10-11 (×2): 10 meq via INTRAVENOUS
  Filled 2021-10-11 (×2): qty 100

## 2021-10-11 MED ORDER — SODIUM CHLORIDE 0.9 % IV SOLN
INTRAVENOUS | Status: DC | PRN
Start: 2021-10-11 — End: 2021-10-14

## 2021-10-11 MED ORDER — POTASSIUM CHLORIDE 10 MEQ/100ML IV SOLN
10.0000 meq | Freq: Once | INTRAVENOUS | Status: AC
  Administered 2021-10-11: 10 meq via INTRAVENOUS
  Filled 2021-10-11: qty 100

## 2021-10-11 MED ORDER — SODIUM CHLORIDE 0.9 % IV SOLN
INTRAVENOUS | Status: DC | PRN
Start: 2021-10-11 — End: 2021-10-15

## 2021-10-11 MED ORDER — FLEET ENEMA 7-19 GM/118ML RE ENEM
1.0000 | ENEMA | Freq: Once | RECTAL | Status: DC
  Filled 2021-10-11 (×2): qty 1

## 2021-10-11 MED ORDER — MAGNESIUM SULFATE IN D5W 1-5 GM/100ML-% IV SOLN
1.0000 g | Freq: Once | INTRAVENOUS | Status: AC
  Administered 2021-10-11: 1 g via INTRAVENOUS
  Filled 2021-10-11: qty 100

## 2021-10-11 MED ORDER — AMLODIPINE BESYLATE 5 MG PO TABS
5.0000 mg | ORAL_TABLET | Freq: Every day | ORAL | Status: DC
  Administered 2021-10-11 – 2021-10-13 (×3): 5 mg via ORAL
  Filled 2021-10-11 (×3): qty 1

## 2021-10-11 NOTE — Plan of Care (Signed)
  Problem: Education: Goal: Knowledge of General Education information will improve Description Including pain rating scale, medication(s)/side effects and non-pharmacologic comfort measures Outcome: Progressing   

## 2021-10-11 NOTE — Plan of Care (Signed)
  Problem: Nutrition: Goal: Adequate nutrition will be maintained Outcome: Progressing   Problem: Elimination: Goal: Will not experience complications related to bowel motility Outcome: Progressing   Problem: Safety: Goal: Ability to remain free from injury will improve Outcome: Progressing   

## 2021-10-11 NOTE — Progress Notes (Addendum)
[facility] ? ?PROGRESS NOTE ? ?Debra Huber ZOX:[id number] DOB: [birth year] DOA: 10/10/2021 ?PCP: Debra Blade, MD ? ? ?Brief HPI:   ?71 year old female with medical history of dementia, hypothyroidism, hypertension was brought to the hospital for poor p.o. intake.  Patient has been becoming weaker and lost weight.  Also has lost appetite.  Did not have nausea vomiting abdominal pain.  In the ED CT scan of the abdomen/pelvis showed large amount of rectal stool with mild adjacent circumferential rectal wall thickening and perirectal stranding-question stercoral colitis. ? ? ?Subjective  ? ?Patient seen and examined, denies any complaints. ? ? Assessment/Plan:  ? ? ?Stercoral colitis ?-Patient started on docusate, MiraLAX ?-We will consult GI for further evaluation and management ? ?Hypokalemia ?-Potassium is still low at 2.9 ?-We will replace potassium and follow BMP in am ? ?Hypocalcemia ?-Serum calcium is 7.1 ?-Corrected calcium is 8.5 ? ?Iron-deficiency anemia ?-Patient was given 1 dose of ferric gluconate yesterday ?-Hemoglobin is 10.9 this morning ? ?Hypothyroidism ?-Continue Synthroid, thyroid Armour 90 mg daily ? ?Dementia ?-Continue Aricept ? ?Hypertension ?-Blood pressure is now elevated ?-We will restart amlodipine and Diovan ?-She is not taking Catapres as per pharmacy notes ?-We will hold HCTZ due to hypokalemia  ? ?Dementia ?-Continue Aricept ? ?Folate deficiency ?-Continue folic acid supplementation ? ?Failure to thrive ?-Dietitian consulted ? ?Lung nodule ?-Seen on CT chest ?-10 mm RIGHT UPPER lobe nodule and may represent malignancy/metastatic disease given patient history. Recommend elective PET-CT for further evaluation. ?-Will need outpatient follow-up with oncology ? ? ?Medications ? ?  ? amitriptyline  25 mg Oral QHS  ? aspirin EC  81 mg Oral Daily  ? docusate sodium  100 mg Oral BID  ? donepezil  10 mg Oral QPM  ? feeding supplement  237 mL Oral TID WC  ? folic acid  1 mg  Oral Daily  ? levothyroxine  112 mcg Oral QAC breakfast  ? mirtazapine  15 mg Oral QHS  ? polyethylene glycol  17 g Oral Daily  ? potassium chloride  40 mEq Oral Once  ? thyroid  90 mg Oral QAC breakfast  ? ? ? Data Reviewed:  ? ?CBG: ? ?No results for input(s): GLUCAP in the last 168 hours. ? ?SpO2: 91 %  ? ? ?Vitals:  ? 10/11/21 0033 10/11/21 0215 10/11/21 0535 10/11/21 0940  ?BP: (!) 154/108 (!) 150/126 (!) 147/103 (!) 130/98  ?Pulse: (!) 110 (!) 108 (!) 116 (!) 107  ?Resp: '20 18 18 18  '$ ?Temp: 98.9 ?F (37.2 ?C) 98.8 ?F (37.1 ?C) 98.7 ?F (37.1 ?C) 97.7 ?F (36.5 ?C)  ?TempSrc: Oral Oral Oral Oral  ?SpO2: 99% 96% 91% 91%  ? ? ? ? ?Data Reviewed: ? ?Basic Metabolic Panel: ?Recent Labs  ?Lab 10/07/21 ?1314 10/07/21 ?1322 10/10/21 ?1305 10/10/21 ?1308 10/10/21 ?2240 10/11/21 ?0452  ?NA 142 143 142  --  140 139  ?K 3.4* 3.5 3.0*  --  3.0* 2.9*  ?CL 107 104 114*  --  112* 109  ?CO2 26  --  23  --  22 21*  ?GLUCOSE 104* 98 80  --  79 61*  ?BUN 32* 27* 11  --  10 9  ?CREATININE 1.01* 1.00 0.56  --  0.52 0.46  ?CALCIUM 7.9*  --  6.4*  --  6.6* 7.1*  ?MG  --   --  1.8  --   --   --   ?PHOS  --   --   --  3.2 3.2  --   ? ? ?CBC: ?Recent Labs  ?Lab 10/07/21 ?1314 10/07/21 ?1322 10/10/21 ?1305 10/10/21 ?1722 10/11/21 ?0452  ?WBC 3.7*  --  4.7 5.4 5.1  ?NEUTROABS 3.1  --  3.5 4.3  --   ?HGB 11.5* 12.2 7.8* 9.0* 10.9*  ?HCT 36.5 36.0 25.5* 28.8* 36.0  ?MCV 82.0  --  84.4 83.0 85.1  ?PLT 149*  --  106* 126* 135*  ? ? ?LFT ?Recent Labs  ?Lab 10/07/21 ?1314 10/10/21 ?1305 10/10/21 ?2240 10/11/21 ?0452  ?AST 39 31  --  31  ?ALT 29 18  --  18  ?ALKPHOS 84 76  --  83  ?BILITOT 0.8 0.6  --  0.8  ?PROT 7.3 5.7*  --  5.7*  ?ALBUMIN 3.0* 2.2* 2.2* 2.2*  ? ?  ?Antibiotics: ?Anti-infectives (From admission, onward)  ? ? None  ? ?  ? ? ? ?DVT prophylaxis: SCDs ? ?Code Status: Full code ? ?Family Communication: No family at bedside ? ? ?CONSULTS  ? ? ?Objective  ? ? ?Physical Examination: ? ?General-appears in no acute distress ?Heart-S1-S2,  regular, no murmur auscultated ?Lungs-clear to auscultation bilaterally, no wheezing or crackles auscultated ?Abdomen-soft, nontender, no organomegaly ?Extremities-no edema in the lower extremities ?Neuro-alert, oriented x3, no focal deficit noted ? ? ?Status is: Inpatient: Failure to thrive ? ? ? ?  ? ? ? ? ? ?Debra Huber ?  ?[facility] ?If 7PM-7AM, please contact night-coverage at [email or web address], ?Office  [phone] ? ? ?10/11/2021, 11:11 AM  LOS: 0 days  ? ? ? ? ? ? ? ? ? ? ?  ?

## 2021-10-11 NOTE — Consult Note (Signed)
Referring Provider: Dr. Darrick Meigs ?Primary Care Physician:  Willey Blade, MD ?Primary Gastroenterologist:  Althia Forts ? ?Reason for Consultation:  Constipation; Abnormal CT ? ?HPI: Debra Huber is a 71 y.o. female with history of colon cancer and s/p right hemicolectomy in 2011. Last colonoscopy in 05/2013 where 2 small polyps were removed. Patient admitted for dehydration and poor appetite and called for a consult due to question of stercoral colitis on CT. Reportedly had a large formed stool in the ER yesterday and none since then and family reports a stool 2 days ago at her skilled nursing facility. Denies abdominal pain/N/V. No history of rectal bleeding. CT showed large amount of stool in the rectum and rectal wall thickening adjacent to it concerning for stercoral colitis. ? ?Past Medical History:  ?Diagnosis Date  ? Anemia   ? Blood transfusion without reported diagnosis   ? Colon cancer ([place]) dx'd 11/2009  ? S/P HEMI-COLECTOMY AND FINISHED CHEMO 11/24/10 -DR. ODOGWU  ? Complication of anesthesia   ? ALWAYS FEELS VERY SLEEPY WAKING UP  ? Hypertension   ? Hyperthyroidism   ? GRAVES DISEASE  ? Thyroid disease   ? ? ?Past Surgical History:  ?Procedure Laterality Date  ? COLECTOMY    ? partial  ? HEMATOMA EVACUATION  02/26/2012  ? Procedure: EVACUATION HEMATOMA;  Surgeon: Pedro Earls, MD;  Location: [facility];  Service: General;  Laterality: N/A;  irrigation and drainage of hematoma and placement of drain.  ? NEEDLE BX THYROID    ? PORT-A-CATH REMOVAL  11/04/11  ? THYROIDECTOMY  02/25/2012  ? Procedure: THYROIDECTOMY;  Surgeon: Pedro Earls, MD;  Location: [facility];  Service: General;;  ? ? ?Prior to Admission medications   ?Medication Sig Start Date End Date Taking? Authorizing Provider  ?amitriptyline (ELAVIL) 25 MG tablet Take 1 tablet (25 mg total) by mouth at bedtime. 09/18/15  Yes [name], MD  ?amLODipine (NORVASC) 5 MG tablet Take 1 tablet (5 mg total) by mouth daily. 09/18/15  Yes [name], MD  ?aspirin EC 81 MG tablet Take 81 mg by mouth in the morning. Swallow whole.   Yes [provider]  ?cloNIDine (CATAPRES) 0.1 MG tablet Take 0.1 mg by mouth 2 (two) times daily.   Yes [provider]  ?donepezil (ARICEPT) 10 MG tablet Take 10 mg by mouth every evening.   Yes [provider]  ?levothyroxine (SYNTHROID) 112 MCG tablet Take 112 mcg by mouth daily before breakfast.   Yes [provider]  ?mirtazapine (REMERON) 15 MG tablet Take 15 mg by mouth at bedtime.   Yes [provider]  ?NP THYROID 90 MG tablet Take 90 mg by mouth daily before breakfast.   Yes [provider]  ?Nutritional Supplements (RESOURCE 2.0) LIQD Take 120 mLs by mouth in the morning, at noon, and at bedtime.   Yes [provider]  ?sodium chloride 0.9 % infusion Inject into the vein as directed. Use 100 ml/hr intravenously X 24 Hours for Dehydration for 2 Days. Give 500 ml bolus, followed by 100 ml/hr x 2 L   Yes [provider]  ?valsartan-hydrochlorothiazide (DIOVAN-HCT) 320-25 MG tablet Take 1 tablet by mouth daily in the afternoon.   Yes [provider]  ?cloNIDine (CATAPRES) 0.1 MG tablet Take 1 tablet (0.1 mg total) by mouth 2 (two) times daily. ?Patient not taking: Reported on 09/28/2021 08/05/21 09/28/21  Darliss Cheney, MD  ?levothyroxine (SYNTHROID) 112 MCG tablet Take 1 tablet (112 mcg total)  by mouth daily before breakfast. ?Patient not taking: Reported on 09/28/2021 08/06/21 09/28/21  Darliss Cheney, MD  ? ? ?Scheduled Meds: ? amitriptyline  25 mg Oral QHS  ? amLODipine  5 mg Oral Daily  ? aspirin EC  81 mg Oral Daily  ? docusate sodium  100 mg Oral BID  ? donepezil  10 mg Oral QPM  ? feeding supplement  237 mL Oral TID WC  ? folic acid  1 mg Oral Daily  ? irbesartan  300 mg Oral Daily  ? levothyroxine  112 mcg Oral QAC breakfast  ? mirtazapine  15 mg Oral QHS  ? polyethylene glycol  17 g Oral Daily  ? potassium chloride  40 mEq Oral Once  ?  thyroid  90 mg Oral QAC breakfast  ? ?Continuous Infusions: ? sodium chloride 10 mL/hr at 10/11/21 1124  ? sodium chloride 10 mL/hr at 10/11/21 1147  ? lactated ringers 75 mL/hr at 10/11/21 1138  ? potassium chloride    ? ?PRN Meds:.sodium chloride, sodium chloride ? ?Allergies as of 10/10/2021 - Review Complete 10/10/2021  ?Allergen Reaction Noted  ? Penicillins Swelling 07/12/2011  ? ? ?Family History  ?Problem Relation Age of Onset  ? Cancer Father   ?     prostat  ? Hypertension Sister   ? Thyroid disease Sister   ? Hypertension Brother   ? Hypertension Sister   ? Thyroid disease Sister   ? Hypertension Sister   ? Hypertension Sister   ? Hypertension Sister   ? Hypertension Sister   ? Hypertension Brother   ? ? ?Social History  ? ?Socioeconomic History  ? Marital status: Single  ?  Spouse name: Not on file  ? Number of children: Not on file  ? Years of education: Not on file  ? Highest education level: Not on file  ?Occupational History  ? Not on file  ?Tobacco Use  ? Smoking status: Never  ? Smokeless tobacco: Never  ? Tobacco comments:  ?  Pt states she has been exposed to lots of secondhand smoke.    ?Substance and Sexual Activity  ? Alcohol use: No  ? Drug use: No  ? Sexual activity: Yes  ?Other Topics Concern  ? Not on file  ?Social History Narrative  ? Not on file  ? ?Social Determinants of Health  ? ?Financial Resource Strain: Not on file  ?Food Insecurity: Not on file  ?Transportation Needs: Not on file  ?Physical Activity: Not on file  ?Stress: Not on file  ?Social Connections: Not on file  ?Intimate Partner Violence: Not on file  ? ? ?Review of Systems: All negative except as stated above in HPI. ? ?Physical Exam: ?Vital signs: ?Vitals:  ? 10/11/21 0535 10/11/21 0940  ?BP: (!) 147/103 (!) 130/98  ?Pulse: (!) 116 (!) 107  ?Resp: 18 18  ?Temp: 98.7 ?F (37.1 ?C) 97.7 ?F (36.5 ?C)  ?SpO2: 91% 91%  ? ?Last BM Date : 10/10/21 ?General:  lethargic, elderly, Well-developed, well-nourished, pleasant and  cooperative in NAD ?Head: normocephalic, atraumatic ?Eyes: anicteric sclera ?ENT: oropharynx clear ?Neck: supple, nontender ?Lungs:  Clear throughout to auscultation.   No wheezes, crackles, or rhonchi. No acute distress. ?Heart:  Regular rate and rhythm; no murmurs, clicks, rubs,  or gallops. ?Abdomen: LLQ tenderness with guarding, soft, nondistended, +BS  ?Rectal:  Deferred ?Ext: no edema ? ?GI:  ?Lab Results: ?Recent Labs  ?  10/10/21 ?1305 10/10/21 ?1722 10/11/21 ?0452  ?WBC 4.7 5.4 5.1  ?HGB  7.8* 9.0* 10.9*  ?HCT 25.5* 28.8* 36.0  ?PLT 106* 126* 135*  ? ?BMET ?Recent Labs  ?  10/10/21 ?1305 10/10/21 ?2240 10/11/21 ?0452  ?NA 142 140 139  ?K 3.0* 3.0* 2.9*  ?CL 114* 112* 109  ?CO2 23 22 21*  ?GLUCOSE 80 79 61*  ?BUN '11 10 9  '$ ?CREATININE 0.56 0.52 0.46  ?CALCIUM 6.4* 6.6* 7.1*  ? ?LFT ?Recent Labs  ?  10/11/21 ?0452  ?PROT 5.7*  ?ALBUMIN 2.2*  ?AST 31  ?ALT 18  ?ALKPHOS 83  ?BILITOT 0.8  ? ?PT/INR ?No results for input(s): LABPROT, INR in the last 72 hours. ? ? ?Studies/Results: ?CT CHEST ABDOMEN PELVIS W CONTRAST ? ?Result Date: 10/10/2021 ?CLINICAL DATA:  71 year old female with hypotension, dehydration and recent fall. Anemia and altered labs. Weakness and slow responses. History of colon cancer and treatment over 10 years ago. EXAM: CT CHEST, ABDOMEN, AND PELVIS WITH CONTRAST TECHNIQUE: Multidetector CT imaging of the chest, abdomen and pelvis was performed following the standard protocol during bolus administration of intravenous contrast. RADIATION DOSE REDUCTION: This exam was performed according to the departmental dose-optimization program which includes automated exposure control, adjustment of the mA and/or kV according to patient size and/or use of iterative reconstruction technique. CONTRAST:  159m OMNIPAQUE IOHEXOL 300 MG/ML  SOLN COMPARISON:  10/05/2013 chest CT, 05/09/2012 chest, abdominal and pelvic CT and other studies. FINDINGS: CT CHEST FINDINGS Cardiovascular: Heart size is normal. Coronary  artery and aortic atherosclerotic calcifications are again noted. There is no evidence of thoracic aortic aneurysm/definite dissection or pericardial effusion. Mediastinum/Nodes: No enlarged mediastinal, hilar,

## 2021-10-12 DIAGNOSIS — E876 Hypokalemia: Secondary | ICD-10-CM | POA: Diagnosis not present

## 2021-10-12 DIAGNOSIS — I10 Essential (primary) hypertension: Secondary | ICD-10-CM | POA: Diagnosis not present

## 2021-10-12 DIAGNOSIS — E039 Hypothyroidism, unspecified: Secondary | ICD-10-CM | POA: Diagnosis not present

## 2021-10-12 DIAGNOSIS — F0390 Unspecified dementia without behavioral disturbance: Secondary | ICD-10-CM | POA: Diagnosis not present

## 2021-10-12 LAB — COMPREHENSIVE METABOLIC PANEL
ALT: 17 U/L (ref 0–44)
AST: 31 U/L (ref 15–41)
Albumin: 2.1 g/dL — ABNORMAL LOW (ref 3.5–5.0)
Alkaline Phosphatase: 83 U/L (ref 38–126)
Anion gap: 6 (ref 5–15)
BUN: 6 mg/dL — ABNORMAL LOW (ref 8–23)
CO2: 25 mmol/L (ref 22–32)
Calcium: 6.7 mg/dL — ABNORMAL LOW (ref 8.9–10.3)
Chloride: 103 mmol/L (ref 98–111)
Creatinine, Ser: 0.45 mg/dL (ref 0.44–1.00)
GFR, Estimated: >60 mL/min (ref >60)
Glucose, Bld: 92 mg/dL (ref 70–99)
Potassium: 2.6 mmol/L — CL (ref 3.5–5.1)
Sodium: 134 mmol/L — ABNORMAL LOW (ref 135–145)
Total Bilirubin: 1 mg/dL (ref 0.3–1.2)
Total Protein: 5.7 g/dL — ABNORMAL LOW (ref 6.5–8.1)

## 2021-10-12 LAB — MAGNESIUM: Magnesium: 1.2 mg/dL — ABNORMAL LOW (ref 1.7–2.4)

## 2021-10-12 MED ORDER — MAGNESIUM SULFATE 4 GM/100ML IV SOLN
4.0000 g | Freq: Once | INTRAVENOUS | Status: AC
Start: 2021-10-12 — End: 2021-10-12
  Administered 2021-10-12: 4 g via INTRAVENOUS
  Filled 2021-10-12: qty 100

## 2021-10-12 MED ORDER — POTASSIUM CHLORIDE 20 MEQ PO PACK
40.0000 meq | PACK | Freq: Once | ORAL | Status: AC
  Administered 2021-10-12: 40 meq via ORAL
  Filled 2021-10-12: qty 2

## 2021-10-12 MED ORDER — ADULT MULTIVITAMIN W/MINERALS CH
1.0000 | ORAL_TABLET | Freq: Every day | ORAL | Status: DC
  Administered 2021-10-12 – 2021-10-19 (×8): 1 via ORAL
  Filled 2021-10-12 (×8): qty 1

## 2021-10-12 MED ORDER — POTASSIUM CHLORIDE 10 MEQ/100ML IV SOLN
10.0000 meq | INTRAVENOUS | Status: AC
  Administered 2021-10-12 (×3): 10 meq via INTRAVENOUS
  Filled 2021-10-12 (×3): qty 100

## 2021-10-12 NOTE — Progress Notes (Signed)
Date and time results received: 10/12/21 at 5:25 ? ? ?Test: Potassium ?Critical Value: 2.6 ? ?Name of Provider Notified: Mansy, A ? ?Orders Received? Or Actions Taken?: orders received. See new orders ?

## 2021-10-12 NOTE — Progress Notes (Signed)
[facility] ? ?PROGRESS NOTE ? ?Debra Huber [place] ZHY:[id number] DOB: 1951/04/27 DOA: 10/10/2021 ?PCP: Willey Blade, MD ? ? ?Brief HPI:   ?71 year old female with medical history of dementia, hypothyroidism, hypertension was brought to the hospital for poor p.o. intake.  Patient has been becoming weaker and lost weight.  Also has lost appetite.  Did not have nausea vomiting abdominal pain.  In the ED CT scan of the abdomen/pelvis showed large amount of rectal stool with mild adjacent circumferential rectal wall thickening and perirectal stranding-question stercoral colitis. ? ? ?Subjective  ? ?Patient seen and examined, denies any complaints.  Had 1 BM yesterday. ? ? Assessment/Plan:  ? ? ?Stercoral colitis ?-Patient started on docusate, MiraLAX ?-Was given fleets enema yesterday ?-Had 1 BM yesterday ?-GI consulted ? ?Hypokalemia ?-Potassium is still low at 2.9 ?-Magnesium very low at 1.2.  Will replace magnesium before replacing potassium ?-We will replace potassium and follow BMP in am ? ?Hypomagnesemia ?-Magnesium very low at 1.2 ?-We will order magnesium sulfate 4 g IV x1 as above ?-Follow serum magnesium in a.m. ? ?Hypocalcemia ?-Serum calcium is 7.1 ?-Corrected calcium is 8.5 ? ?Iron-deficiency anemia ?-Patient was given 1 dose of ferric gluconate yesterday ?-Hemoglobin is 10.9 this morning ? ?Hypothyroidism ?-Continue Synthroid, thyroid Armour 90 mg daily ? ?Dementia ?-Continue Aricept ? ?Hypertension ?-Blood pressure is now elevated ?-We will restart amlodipine and Diovan ?-She is not taking Catapres as per pharmacy notes ?-We will hold HCTZ due to hypokalemia  ? ?Dementia ?-Continue Aricept ? ?Folate deficiency ?-Continue folic acid supplementation ? ?Failure to thrive ?-Dietitian consulted ? ?Lung nodule ?-Seen on CT chest ?-10 mm RIGHT UPPER lobe nodule and may represent malignancy/metastatic disease given patient history. Recommend elective PET-CT for further evaluation. ?-Will need  outpatient follow-up with oncology ? ? ?Medications ? ?  ? amitriptyline  25 mg Oral QHS  ? amLODipine  5 mg Oral Daily  ? aspirin EC  81 mg Oral Daily  ? docusate sodium  100 mg Oral BID  ? donepezil  10 mg Oral QPM  ? feeding supplement  237 mL Oral TID WC  ? folic acid  1 mg Oral Daily  ? irbesartan  300 mg Oral Daily  ? levothyroxine  112 mcg Oral QAC breakfast  ? mirtazapine  15 mg Oral QHS  ? multivitamin with minerals  1 tablet Oral Daily  ? polyethylene glycol  17 g Oral Daily  ? sodium phosphate  1 enema Rectal Once  ? thyroid  90 mg Oral QAC breakfast  ? ? ? Data Reviewed:  ? ?CBG: ? ?No results for input(s): GLUCAP in the last 168 hours. ? ?SpO2: 98 %  ? ? ?Vitals:  ? 10/12/21 0044 10/12/21 0510 10/12/21 0859 10/12/21 1318  ?BP: (!) 136/101 (!) 132/100 114/90 105/84  ?Pulse: (!) 116 (!) 112 (!) 113 (!) 101  ?Resp: '18 18 18 18  '$ ?Temp: 98 ?F (36.7 ?C) 98.1 ?F (36.7 ?C) 97.7 ?F (36.5 ?C) (!) 97.4 ?F (36.3 ?C)  ?TempSrc: Oral Oral Oral Oral  ?SpO2: 98% 93% 98% 98%  ?Weight:      ?Height:      ? ? ? ? ?Data Reviewed: ? ?Basic Metabolic Panel: ?Recent Labs  ?Lab 10/07/21 ?1314 10/07/21 ?1322 10/10/21 ?1305 10/10/21 ?1308 10/10/21 ?2240 10/11/21 ?0452 10/12/21 ?0431 10/12/21 ?2952  ?NA 142 143 142  --  140 139 134*  --   ?K 3.4* 3.5 3.0*  --  3.0* 2.9* 2.6*  --   ?CL  107 104 114*  --  112* 109 103  --   ?CO2 26  --  23  --  22 21* 25  --   ?GLUCOSE 104* 98 80  --  79 61* 92  --   ?BUN 32* 27* 11  --  10 9 6*  --   ?CREATININE 1.01* 1.00 0.56  --  0.52 0.46 0.45  --   ?CALCIUM 7.9*  --  6.4*  --  6.6* 7.1* 6.7*  --   ?MG  --   --  1.8  --   --   --   --  1.2*  ?PHOS  --   --   --  3.2 3.2  --   --   --   ? ? ?CBC: ?Recent Labs  ?Lab 10/07/21 ?1314 10/07/21 ?1322 10/10/21 ?1305 10/10/21 ?1722 10/11/21 ?0452  ?WBC 3.7*  --  4.7 5.4 5.1  ?NEUTROABS 3.1  --  3.5 4.3  --   ?HGB 11.5* 12.2 7.8* 9.0* 10.9*  ?HCT 36.5 36.0 25.5* 28.8* 36.0  ?MCV 82.0  --  84.4 83.0 85.1  ?PLT 149*  --  106* 126* 135*  ? ? ?LFT ?Recent  Labs  ?Lab 10/07/21 ?1314 10/10/21 ?1305 10/10/21 ?2240 10/11/21 ?3335 10/12/21 ?0431  ?AST 39 31  --  31 31  ?ALT 29 18  --  18 17  ?ALKPHOS 84 76  --  83 83  ?BILITOT 0.8 0.6  --  0.8 1.0  ?PROT 7.3 5.7*  --  5.7* 5.7*  ?ALBUMIN 3.0* 2.2* 2.2* 2.2* 2.1*  ? ?  ?Antibiotics: ?Anti-infectives (From admission, onward)  ? ? None  ? ?  ? ? ? ?DVT prophylaxis: SCDs ? ?Code Status: Full code ? ?Family Communication: No family at bedside ? ? ?CONSULTS  ? ? ?Objective  ? ? ?Physical Examination: ? ?General-appears in no acute distress ?Heart-S1-S2, regular, no murmur auscultated ?Lungs-clear to auscultation bilaterally, no wheezing or crackles auscultated ?Abdomen-soft, nontender, no organomegaly ?Extremities-no edema in the lower extremities ?Neuro-alert, oriented x3, no focal deficit noted ? ? ?Status is: Inpatient: Failure to thrive ? ? ? ?  ? ? ?[name] ?  ?[facility] ?If 7PM-7AM, please contact night-coverage at [email or web address], ?Office  [phone] ? ? ?10/12/2021, 2:07 PM  LOS: 1 day  ? ? ? ? ? ? ? ? ? ? ?  ?

## 2021-10-12 NOTE — Progress Notes (Signed)
Initial Nutrition Assessment ? ?INTERVENTION:  ? ?-Boost Breeze po TID, each supplement provides 250 kcal and 9 grams of protein ? ?-Multivitamin with minerals daily ? ?NUTRITION DIAGNOSIS:  ? ?Inadequate oral intake related to poor appetite as evidenced by per patient/family report. ? ?GOAL:  ? ?Patient will meet greater than or equal to 90% of their needs ? ?MONITOR:  ? ?PO intake, Supplement acceptance, Labs, Weight trends, I & O's ? ?REASON FOR ASSESSMENT:  ? ?Consult ?Assessment of nutrition requirement/status ? ?ASSESSMENT:  ? ?71 year old female with medical history of dementia, hypothyroidism, hypertension was brought to the hospital for poor p.o. intake.  Patient has been becoming weaker and lost weight.  Also has lost appetite.  Did not have nausea vomiting abdominal pain.  In the ED CT scan of the abdomen/pelvis showed large amount of rectal stool with mild adjacent circumferential rectal wall thickening and perirectal stranding-question stercoral colitis. ? ?Patient in room, was able to answer my questions. A/o x 2. H/o dementia.  ?Pt reports poor appetite recently, states she still tries to eat but doesn't worry if he doesn't eat that much. States her UBW is between 130-140 lbs. Current weight: 138 lbs. ?She had not eaten any of her breakfast yet of oatmeal and fruit. Has been drinking Boost Breeze supplements and likes them. Will continue.  ? ?Medications: Colace, Folic acid, Remeron, Miralax, KLOR-CON, Lactated ringers, IV Mg sulfate  ? ?Labs reviewed: ? Low Na, K, Mg ? ?NUTRITION - FOCUSED PHYSICAL EXAM: ? ?Flowsheet Row Most Recent Value  ?Orbital Region No depletion  ?Upper Arm Region No depletion  ?Thoracic and Lumbar Region No depletion  ?Buccal Region No depletion  ?Temple Region Mild depletion  ?Clavicle Bone Region Mild depletion  ?Clavicle and Acromion Bone Region Mild depletion  ?Scapular Bone Region Mild depletion  ?Dorsal Hand No depletion  ?Patellar Region Mild depletion  ?Anterior Thigh  Region Mild depletion  ?Posterior Calf Region Mild depletion  ?Edema (RD Assessment) None  ?Hair Reviewed  [short]  ?Eyes Reviewed  [a bit red and irritated]  ?Mouth Reviewed  ?Skin Reviewed  ? ?  ? ? ?Diet Order:   ?Diet Order   ? ?       ?  Diet regular Room service appropriate? Yes; Fluid consistency: Thin  Diet effective now       ?  ? ?  ?  ? ?  ? ? ?EDUCATION NEEDS:  ? ?No education needs have been identified at this time ? ?Skin:  Skin Assessment: Reviewed RN Assessment ? ?Last BM:  4/30 -type 6 ? ?Height:  ? ?Ht Readings from Last 1 Encounters:  ?10/11/21 '5\' 7"'$  (1.702 m)  ? ? ?Weight:  ? ?Wt Readings from Last 1 Encounters:  ?10/11/21 62.7 kg  ? ? ?BMI:  Body mass index is 21.65 kg/m?. ? ?Estimated Nutritional Needs:  ? ?Kcal:  1600-1800 ? ?Protein:  75-85g ? ?Fluid:  1.8L/day ? ?Clayton Bibles, MS, RD, LDN ?Inpatient Clinical Dietitian ?Contact information available via Amion ? ?

## 2021-10-12 NOTE — Progress Notes (Addendum)
[facility] Progress Note ? ?Paulina Muchmore [place] 71 y.o. 1950/09/03 ? ?CC:  Constipation; Abnormal CT ? ? ?Subjective: ?Patient seen lying in bed.  Nurse reports 1 medium sized [place] 6 bowel movement yesterday morning.  Denies abdominal pain.  Tolerating solid diet. ? ?ROS : Review of Systems  ?Gastrointestinal:  Positive for constipation. Negative for abdominal pain, blood in stool, diarrhea, heartburn, melena, nausea and vomiting.  ?Genitourinary:  Negative for dysuria and frequency.  ? ? ? ?Objective: ?Vital signs in last 24 hours: ?Vitals:  ? 10/12/21 0510 10/12/21 0859  ?BP: (!) 132/100 114/90  ?Pulse: (!) 112 (!) 113  ?Resp: 18 18  ?Temp: 98.1 ?F (36.7 ?C) 97.7 ?F (36.5 ?C)  ?SpO2: 93% 98%  ? ? ?Physical Exam: ? ?General:  Alert, cooperative, no distress, appears stated age  ?Head:  Normocephalic, without obvious abnormality, atraumatic  ?Eyes:  Anicteric sclera, EOM's intact  ?Lungs:   Clear to auscultation bilaterally, respirations unlabored  ?Heart:  Regular rate and rhythm, S1, S2 normal  ?Abdomen:   Soft, non-tender, bowel sounds active all four quadrants,  no masses,   ?Extremities: Extremities normal, atraumatic, no  edema  ?Pulses: 2+ and symmetric  ? ? ?Lab Results: ?Recent Labs  ?  10/10/21 ?1305 10/10/21 ?1308 10/10/21 ?2240 10/11/21 ?0452 10/12/21 ?0431 10/12/21 ?1505  ?NA 142  --  140 139 134*  --   ?K 3.0*  --  3.0* 2.9* 2.6*  --   ?CL 114*  --  112* 109 103  --   ?CO2 23  --  22 21* 25  --   ?GLUCOSE 80  --  79 61* 92  --   ?BUN 11  --  10 9 6*  --   ?CREATININE 0.56  --  0.52 0.46 0.45  --   ?CALCIUM 6.4*  --  6.6* 7.1* 6.7*  --   ?MG 1.8  --   --   --   --  1.2*  ?PHOS  --  3.2 3.2  --   --   --   ? ?Recent Labs  ?  10/11/21 ?0452 10/12/21 ?0431  ?AST 31 31  ?ALT 18 17  ?ALKPHOS 83 83  ?BILITOT 0.8 1.0  ?PROT 5.7* 5.7*  ?ALBUMIN 2.2* 2.1*  ? ?Recent Labs  ?  10/10/21 ?1305 10/10/21 ?1722 10/11/21 ?0452  ?WBC 4.7 5.4 5.1  ?NEUTROABS 3.5 4.3  --   ?HGB 7.8* 9.0* 10.9*  ?HCT  25.5* 28.8* 36.0  ?MCV 84.4 83.0 85.1  ?PLT 106* 126* 135*  ? ?No results for input(s): LABPROT, INR in the last 72 hours. ? ? ? ?Assessment ?Fecal impaction causing colonic inflammation from pressure of impacted stool (stercoral colitis).  ? ?Had 1 bowel movement yesterday.  No abdominal pain today. ? ?Hgb improving at 10.9 ? ?Hypokalemic at 2.6, magnesium low at 1.2. ? ? ?Plan: ?Continue supportive care ?Continue MiraLAX 17 g daily ?No plan for procedure at this time ?Continue magnesium and potassium repletion ?Can continue use of Fleet enema as needed for severe constipation. ?[facility] will sign off. ? ?Charlott Rakes PA-C ?10/12/2021, 10:08 AM ? ?Contact #  [phone]  ?

## 2021-10-13 ENCOUNTER — Inpatient Hospital Stay (HOSPITAL_COMMUNITY): Payer: Medicare Other

## 2021-10-13 DIAGNOSIS — F0390 Unspecified dementia without behavioral disturbance: Secondary | ICD-10-CM | POA: Diagnosis not present

## 2021-10-13 DIAGNOSIS — I10 Essential (primary) hypertension: Secondary | ICD-10-CM | POA: Diagnosis not present

## 2021-10-13 DIAGNOSIS — E039 Hypothyroidism, unspecified: Secondary | ICD-10-CM | POA: Diagnosis not present

## 2021-10-13 DIAGNOSIS — E876 Hypokalemia: Secondary | ICD-10-CM | POA: Diagnosis not present

## 2021-10-13 LAB — COMPREHENSIVE METABOLIC PANEL
ALT: 14 U/L (ref 0–44)
AST: 29 U/L (ref 15–41)
Albumin: 1.7 g/dL — ABNORMAL LOW (ref 3.5–5.0)
Alkaline Phosphatase: 65 U/L (ref 38–126)
Anion gap: 5 (ref 5–15)
BUN: 7 mg/dL — ABNORMAL LOW (ref 8–23)
CO2: 23 mmol/L (ref 22–32)
Calcium: 6.2 mg/dL — CL (ref 8.9–10.3)
Chloride: 103 mmol/L (ref 98–111)
Creatinine, Ser: 0.49 mg/dL (ref 0.44–1.00)
GFR, Estimated: >60 mL/min (ref >60)
Glucose, Bld: 75 mg/dL (ref 70–99)
Potassium: 3.4 mmol/L — ABNORMAL LOW (ref 3.5–5.1)
Sodium: 131 mmol/L — ABNORMAL LOW (ref 135–145)
Total Bilirubin: 0.7 mg/dL (ref 0.3–1.2)
Total Protein: 4.8 g/dL — ABNORMAL LOW (ref 6.5–8.1)

## 2021-10-13 LAB — URINALYSIS, MICROSCOPIC (REFLEX)

## 2021-10-13 LAB — URINALYSIS, ROUTINE W REFLEX MICROSCOPIC
Bilirubin Urine: NEGATIVE
Glucose, UA: NEGATIVE mg/dL
Hgb urine dipstick: NEGATIVE
Ketones, ur: NEGATIVE mg/dL
Nitrite: NEGATIVE
Protein, ur: NEGATIVE mg/dL
Specific Gravity, Urine: 1.01 (ref 1.005–1.030)
pH: 6.5 (ref 5.0–8.0)

## 2021-10-13 LAB — MRSA NEXT GEN BY PCR, NASAL: MRSA by PCR Next Gen: NOT DETECTED

## 2021-10-13 LAB — MAGNESIUM: Magnesium: 2.2 mg/dL (ref 1.7–2.4)

## 2021-10-13 LAB — LACTIC ACID, PLASMA
Lactic Acid, Venous: 0.9 mmol/L (ref 0.5–1.9)
Lactic Acid, Venous: 1 mmol/L (ref 0.5–1.9)

## 2021-10-13 MED ORDER — VANCOMYCIN HCL 1250 MG/250ML IV SOLN
1250.0000 mg | Freq: Once | INTRAVENOUS | Status: AC
  Administered 2021-10-13: 1250 mg via INTRAVENOUS
  Filled 2021-10-13: qty 250

## 2021-10-13 MED ORDER — POLYETHYLENE GLYCOL 3350 17 GM/SCOOP PO POWD
1.0000 | Freq: Once | ORAL | Status: DC
  Filled 2021-10-13: qty 255

## 2021-10-13 MED ORDER — SODIUM CHLORIDE 0.9 % IV SOLN
2.0000 g | Freq: Three times a day (TID) | INTRAVENOUS | Status: DC
  Administered 2021-10-13 – 2021-10-15 (×6): 2 g via INTRAVENOUS
  Filled 2021-10-13 (×7): qty 12.5

## 2021-10-13 MED ORDER — METRONIDAZOLE 500 MG PO TABS
500.0000 mg | ORAL_TABLET | Freq: Two times a day (BID) | ORAL | Status: DC
  Administered 2021-10-13 – 2021-10-14 (×4): 500 mg via ORAL
  Filled 2021-10-13 (×4): qty 1

## 2021-10-13 MED ORDER — SODIUM CHLORIDE 0.9 % IV SOLN: INTRAVENOUS | Status: DC

## 2021-10-13 MED ORDER — POTASSIUM CHLORIDE 10 MEQ/100ML IV SOLN
10.0000 meq | Freq: Once | INTRAVENOUS | Status: AC
  Administered 2021-10-13: 10 meq via INTRAVENOUS
  Filled 2021-10-13: qty 100

## 2021-10-13 MED ORDER — CALCIUM GLUCONATE-NACL 1-0.675 GM/50ML-% IV SOLN
1.0000 g | Freq: Once | INTRAVENOUS | Status: AC
  Administered 2021-10-13: 1000 mg via INTRAVENOUS
  Filled 2021-10-13 (×2): qty 50

## 2021-10-13 NOTE — Plan of Care (Signed)

## 2021-10-13 NOTE — Progress Notes (Signed)
PT Cancellation Note ? ?Patient Details ?Name: Debra Huber ?MRN: [id number] ?DOB: 26-Apr-1951 ? ? ?Cancelled Treatment:    Reason Eval/Treat Not Completed: Medical issues which prohibited therapy, Red MEWS, RR called. ?[name] PT ?Acute Rehabilitation Services ?Pager [phone] ?Office [phone].sign ? ? ? ?[name] ?10/13/2021, 11:53 AM ?

## 2021-10-13 NOTE — Progress Notes (Signed)
[facility] Progress Note ? ?Debra Huber [place] 71 y.o. Jun 08, 1951 ? ?CC:  Constipation; Abnormal CT ? ? ?Subjective: ?Patient seen and examined laying in bed accompanied by fianc?.  Nurse reports 1 bowel movement today slightly mushy no blood no black stool.  Tolerating normal diet well.  Continues to have some left upper quadrant abdominal pain. ? ?ROS : Review of Systems  ?Gastrointestinal:  Positive for abdominal pain. Negative for blood in stool, constipation, diarrhea, heartburn, melena, nausea and vomiting.  ?Genitourinary:  Negative for dysuria and urgency.  ? ? ? ?Objective: ?Vital signs in last 24 hours: ?Vitals:  ? 10/13/21 0950 10/13/21 1057  ?BP: 103/79 115/83  ?Pulse: (!) 103   ?Resp: 16 18  ?Temp: 97.7 ?F (36.5 ?C)   ?SpO2: 98%   ? ? ?Physical Exam: ? ?General:  Alert, cooperative, no distress, appears stated age, thin  ?Head:  Normocephalic, without obvious abnormality, atraumatic  ?Eyes:  Anicteric sclera, EOM's intact  ?Lungs:   Clear to auscultation bilaterally, respirations unlabored  ?Heart:  Regular rate and rhythm, S1, S2 normal  ?Abdomen:   Soft, left upper quadrant and epigastric tenderness to palpation, bowel sounds active all four quadrants,  no masses,   ?Extremities: Extremities normal, atraumatic, no  edema  ?Pulses: 2+ and symmetric  ? ? ?Lab Results: ?Recent Labs  ?  10/10/21 ?1308 10/10/21 ?2240 10/11/21 ?0452 10/12/21 ?0431 10/12/21 ?1540 10/13/21 ?0867  ?NA  --  140   < > 134*  --  131*  ?K  --  3.0*   < > 2.6*  --  3.4*  ?CL  --  112*   < > 103  --  103  ?CO2  --  22   < > 25  --  23  ?GLUCOSE  --  79   < > 92  --  75  ?BUN  --  10   < > 6*  --  7*  ?CREATININE  --  0.52   < > 0.45  --  0.49  ?CALCIUM  --  6.6*   < > 6.7*  --  6.2*  ?MG  --   --   --   --  1.2* 2.2  ?PHOS 3.2 3.2  --   --   --   --   ? < > = values in this interval not displayed.  ? ?Recent Labs  ?  10/12/21 ?0431 10/13/21 ?6195  ?AST 31 29  ?ALT 17 14  ?ALKPHOS 83 65  ?BILITOT 1.0 0.7  ?PROT 5.7*  4.8*  ?ALBUMIN 2.1* 1.7*  ? ?Recent Labs  ?  10/10/21 ?1305 10/10/21 ?1722 10/11/21 ?0452  ?WBC 4.7 5.4 5.1  ?NEUTROABS 3.5 4.3  --   ?HGB 7.8* 9.0* 10.9*  ?HCT 25.5* 28.8* 36.0  ?MCV 84.4 83.0 85.1  ?PLT 106* 126* 135*  ? ?No results for input(s): LABPROT, INR in the last 72 hours. ? ? ? ?Assessment ?Fecal impaction ?Stercoral colitis ? ?Patient continues to have bowel movements today. ?Continues to have some left upper quadrant pain ?Weight loss brought up by fianc? yesterday. ? ?CT abdomen chest and pelvis 10/10/2021 noted 10 mm right upper lobe nodule along possible malignancy or metastasis given patient's history recommended PET CT scan.  Possible rectal polyp or mass hidden in the inflammation in the colon.  May need colonoscopy in the future. ? ?Plan: ?Continue supportive care ?We will discuss with Dr. Michail Sermon plans for possible colonoscopy if needed to evaluate for weight loss. ?[facility] will follow ? ?Charlott Rakes PA-C ?10/13/2021, 11:33 AM ? ?Contact #  [phone]  ?

## 2021-10-13 NOTE — Progress Notes (Addendum)
Pharmacy Antibiotic Note ? ?Debra Huber is a 71 y.o. female admitted on 10/10/2021 with poor PO intake.  Pharmacy has been consulted for vancomycin, cefepime, flagyl dosing for sepsis.  ? ?Plan: ?Cefepime 2 gm IV q8hr ?Flagyl 500 mg po q12hr ?Vancomycin 1250 mg IV q24 for est AUC 477.7 ?SCr rounded up to 0.8 ?F/u WBC, temp, culture data ?Vancomycin levels as needed ?F/u MRSA PCR ? ?Height: '5\' 7"'$  (170.2 cm) ?Weight: 62.7 kg (138 lb 3.2 oz) ?IBW/kg (Calculated) : 61.6 ? ?Temp (24hrs), Avg:98.8 ?F (37.1 ?C), Min:97.4 ?F (36.3 ?C), Max:100.6 ?F (38.1 ?C) ? ?Recent Labs  ?Lab 10/07/21 ?1314 10/07/21 ?1322 10/10/21 ?1305 10/10/21 ?1722 10/10/21 ?2240 10/11/21 ?4917 10/12/21 ?0431 10/13/21 ?9150  ?WBC 3.7*  --  4.7 5.4  --  5.1  --   --   ?CREATININE 1.01*   < > 0.56  --  0.52 0.46 0.45 0.49  ? < > = values in this interval not displayed.  ?  ?Estimated Creatinine Clearance: 64.5 mL/min (by C-G formula based on SCr of 0.49 mg/dL).   ? ?Allergies  ?Allergen Reactions  ? Penicillins Swelling  ?  Did PCN reaction causing immediate rash, facial/tongue/throat swelling, SOB or lightheadedness with hypotension: Yes ?Did PCN reaction causing severe rash involving mucus membranes or skin necrosis: unknown ?Did PCN reaction that required hospitalization Yes- was in the hospital and i was given penicillin and had a reaction ?Did PCN reaction occurring within the last 10 years: No, about 15 years or more ?If all of the above answers are "NO", then may proceed with Cephalosporin use. ?  ? ? ?Antimicrobials this admission: ?5/2 cefepime>> ?5/2 vancomycin>> ?5/2 flagyl >> ?Dose adjustments this admission: ? ?Microbiology results: ?5/2 UCx: ?5/2 BCx2:  ?5/2 MRSA:  ? ?Thank you for allowing pharmacy to be a part of this patient?s care. ? ?Eudelia Bunch, Pharm.D ?10/13/2021 8:45 AM ? ?

## 2021-10-13 NOTE — Progress Notes (Signed)
?   10/13/21 0710  ?Assess: MEWS Score  ?Temp (!) 100.6 ?F (38.1 ?C)  ?BP 96/65  ?Pulse Rate (!) 118  ?Resp 20  ?Level of Consciousness Alert  ?SpO2 96 %  ?O2 Device Room Air  ?O2 Flow Rate (L/min) 2 L/min  ?Assess: MEWS Score  ?MEWS Temp 1  ?MEWS Systolic 1  ?MEWS Pulse 2  ?MEWS RR 0  ?MEWS LOC 0  ?MEWS Score 4  ?MEWS Score Color Red  ?Assess: if the MEWS score is Yellow or Red  ?Were vital signs taken at a resting state? Yes  ?Focused Assessment Change from prior assessment (see assessment flowsheet)  ?Does the patient meet 2 or more of the SIRS criteria? Yes  ?Does the patient have a confirmed or suspected source of infection? No  ?MEWS guidelines implemented *See Row Information* Yes  ?Treat  ?MEWS Interventions Administered prn meds/treatments  ?Pain Scale 0-10  ?Pain Score 0  ?Faces Pain Scale 0  ?Breathing 0  ?Negative Vocalization 0  ?Facial Expression 0  ?Body Language 0  ?Consolability 0  ?PAINAD Score 0  ?Take Vital Signs  ?Increase Vital Sign Frequency  Red: Q 1hr X 4 then Q 4hr X 4, if remains red, continue Q 4hrs  ?Escalate  ?MEWS: Escalate Red: discuss with charge nurse/RN and provider, consider discussing with RRT  ?Notify: Charge Nurse/RN  ?Name of Charge Nurse/RN Notified Pamala Hurry RN  ?Date Charge Nurse/RN Notified 10/13/21  ?Time Charge Nurse/RN Notified [phone]  ?Notify: Provider  ?Provider Name/Title Dr. Darrick Meigs  ?Date Provider Notified 10/13/21  ?Time Provider Notified [phone]  ?Notification Type Page  ?Notification Reason Change in status;Critical result  ?Provider response At bedside  ?Date of Provider Response 10/13/21  ?Time of Provider Response [phone]  ?Notify: Rapid Response  ?Date Rapid Response Notified 10/13/21  ?Time Rapid Response Notified 0811  ?Document  ?Patient Outcome Not stable and remains on department  ?Progress note created (see row info) Yes  ?Assess: SIRS CRITERIA  ?SIRS Temperature  0  ?SIRS Pulse 1  ?SIRS Respirations  0  ?SIRS WBC 0  ?SIRS Score Sum  1  ? ?Patient's vital signs  changed. MD paged, Rapid response called. Pt remains on unit with interventions put in place. Will reassess patient's response to interventions.  ?

## 2021-10-13 NOTE — Progress Notes (Signed)
?   10/13/21 0646  ?Assess: MEWS Score  ?Temp 99.1 ?F (37.3 ?C)  ?BP 92/64  ?Pulse Rate (!) 116  ?ECG Heart Rate (!) 117  ?Resp 18  ?Level of Consciousness Alert  ?SpO2 (!) 89 %  ?O2 Device Room Air  ?Assess: if the MEWS score is Yellow or Red  ?Were vital signs taken at a resting state? Yes  ?Focused Assessment No change from prior assessment  ?Does the patient meet 2 or more of the SIRS criteria? No  ?MEWS guidelines implemented *See Row Information* No, previously yellow, continue vital signs every 4 hours  ?Treat  ?MEWS Interventions Other (Comment) ?(started O2-2L [place])  ?Pain Scale 0-10  ?Pain Score 0  ?Take Vital Signs  ?Increase Vital Sign Frequency  Yellow: Q 2hr X 2 then Q 4hr X 2, if remains yellow, continue Q 4hrs  ?Escalate  ?MEWS: Escalate Yellow: discuss with charge nurse/RN and consider discussing with provider and RRT  ?Notify: Charge Nurse/RN  ?Name of Charge Nurse/RN Notified Corene Cornea  ?Date Charge Nurse/RN Notified 10/13/21  ?Time Charge Nurse/RN Notified [phone]  ?Notify: Provider  ?Provider Name/Title Olena Heckle  ?Date Provider Notified 10/13/21  ?Time Provider Notified [phone]  ?Notification Type Page  ?Notification Reason Change in status ?(increased O2 needs)  ?Provider response Evaluate remotely  ?Date of Provider Response 10/13/21  ?Time of Provider Response [phone]  ?Document  ?Patient Outcome Stabilized after interventions  ? ? ?

## 2021-10-13 NOTE — Progress Notes (Signed)
[facility] ? ?PROGRESS NOTE ? ?Debra Huber [place] AVW:[id number] DOB: [birth year] DOA: 10/10/2021 ?PCP: Debra Blade, MD ? ? ?Brief HPI:   ?71 year old female with medical history of dementia, hypothyroidism, hypertension was brought to the hospital for poor p.o. intake.  Patient has been becoming weaker and lost weight.  Also has lost appetite.  Did not have nausea vomiting abdominal pain.  In the ED CT scan of the abdomen/pelvis showed large amount of rectal stool with mild adjacent circumferential rectal wall thickening and perirectal stranding-question stercoral colitis. ? ? ?Subjective  ? ?Patient seen, this morning patient was found to be hypotensive, with tachycardia. ? ? Assessment/Plan:  ? ? ?Sepsis ?-Patient found to be hypotensive with tachycardia ?-Stat lactic acid obtained was 0.7 ?-Patient was started on vancomycin, cefepime and Flagyl ?-Chest x-ray showed mild ill-defined opacity in the left lower lung base likely atelectasis ?-Repeat UA done today was clear ?-Urine culture blood culture obtained ?-Improved after starting antibiotics.  We will follow culture results ? ?Stercoral colitis ?-Patient started on docusate, MiraLAX ?-Was given fleets enema yesterday ?-Had 1 BM yesterday ?-GI consulted ?-May need colonoscopy will defer to GI ? ?History of colon cancer ?-S/p right colectomy, had colonoscopy 10 years ago ?-She is currently in remission ?-GI following ? ?Hypokalemia ?-Potassium improved to 3.4 ?-We will replace potassium and follow BMP in am ? ?Hypomagnesemia ?-Replete ? ?Hypocalcemia ?-Serum calcium is 6.2 ?-Corrected calcium is 8.0 ?-We will give 1 g of calcium gluconate IV ? ?Iron-deficiency anemia ?-Patient was given 1 dose of ferric gluconate  ?-Hemoglobin is 10.9 this morning ? ?Hypothyroidism ?-Continue Synthroid, thyroid Armour 90 mg daily ? ?Dementia ?-Continue Aricept ? ?Hypertension ?-Patient was hypotensive this morning ?-We will discontinue amlodipine,  Avapro ? ?Dementia ?-Continue Aricept ? ?Folate deficiency ?-Continue folic acid supplementation ? ?Failure to thrive ?-Dietitian consulted ? ?Lung nodule ?-Seen on CT chest ?-10 mm RIGHT UPPER lobe nodule and may represent malignancy/metastatic disease given patient history. Recommend elective PET-CT for further evaluation. ?-Will need outpatient follow-up with oncology ? ? ?Medications ? ?  ? amitriptyline  25 mg Oral QHS  ? amLODipine  5 mg Oral Daily  ? aspirin EC  81 mg Oral Daily  ? docusate sodium  100 mg Oral BID  ? donepezil  10 mg Oral QPM  ? feeding supplement  237 mL Oral TID WC  ? folic acid  1 mg Oral Daily  ? irbesartan  300 mg Oral Daily  ? levothyroxine  112 mcg Oral QAC breakfast  ? metroNIDAZOLE  500 mg Oral Q12H  ? mirtazapine  15 mg Oral QHS  ? multivitamin with minerals  1 tablet Oral Daily  ? polyethylene glycol powder  1 Container Oral Once  ? thyroid  90 mg Oral QAC breakfast  ? ? ? Data Reviewed:  ? ?CBG: ? ?No results for input(s): GLUCAP in the last 168 hours. ? ?SpO2: 98 % ?O2 Flow Rate (L/min): 2 L/min  ? ? ?Vitals:  ? 10/13/21 0857 10/13/21 0950 10/13/21 1057 10/13/21 1202  ?BP: 97/72 103/79 115/83 111/86  ?Pulse: (!) 107 (!) 103  (!) 108  ?Resp: '16 16 18 16  '$ ?Temp: 98.5 ?F (36.9 ?C) 97.7 ?F (36.5 ?C)  (!) 97.5 ?F (36.4 ?C)  ?TempSrc: Oral Oral  Oral  ?SpO2: 100% 98%  98%  ?Weight:      ?Height:      ? ? ? ? ?Data Reviewed: ? ?Basic Metabolic Panel: ?Recent Labs  ?Lab 10/10/21 ?1305 10/10/21 ?1308  10/10/21 ?2240 10/11/21 ?6387 10/12/21 ?5643 10/12/21 ?3295 10/13/21 ?1884  ?NA 142  --  140 139 134*  --  131*  ?K 3.0*  --  3.0* 2.9* 2.6*  --  3.4*  ?CL 114*  --  112* 109 103  --  103  ?CO2 23  --  22 21* 25  --  23  ?GLUCOSE 80  --  79 61* 92  --  75  ?BUN 11  --  10 9 6*  --  7*  ?CREATININE 0.56  --  0.52 0.46 0.45  --  0.49  ?CALCIUM 6.4*  --  6.6* 7.1* 6.7*  --  6.2*  ?MG 1.8  --   --   --   --  1.2* 2.2  ?PHOS  --  3.2 3.2  --   --   --   --   ? ? ?CBC: ?Recent Labs  ?Lab  10/07/21 ?1314 10/07/21 ?1322 10/10/21 ?1305 10/10/21 ?1722 10/11/21 ?0452  ?WBC 3.7*  --  4.7 5.4 5.1  ?NEUTROABS 3.1  --  3.5 4.3  --   ?HGB 11.5* 12.2 7.8* 9.0* 10.9*  ?HCT 36.5 36.0 25.5* 28.8* 36.0  ?MCV 82.0  --  84.4 83.0 85.1  ?PLT 149*  --  106* 126* 135*  ? ? ?LFT ?Recent Labs  ?Lab 10/07/21 ?1314 10/10/21 ?1305 10/10/21 ?2240 10/11/21 ?1660 10/12/21 ?0431 10/13/21 ?6301  ?AST 39 31  --  '31 31 29  '$ ?ALT 29 18  --  '18 17 14  '$ ?ALKPHOS 84 76  --  83 83 65  ?BILITOT 0.8 0.6  --  0.8 1.0 0.7  ?PROT 7.3 5.7*  --  5.7* 5.7* 4.8*  ?ALBUMIN 3.0* 2.2* 2.2* 2.2* 2.1* 1.7*  ? ?  ?Antibiotics: ?Anti-infectives (From admission, onward)  ? ? Start     Dose/Rate Route Frequency Ordered Stop  ? 10/13/21 1000  ceFEPIme (MAXIPIME) 2 g in sodium chloride 0.9 % 100 mL IVPB       ? 2 g ?200 mL/hr over 30 Minutes Intravenous Every 8 hours 10/13/21 0837    ? 10/13/21 1000  metroNIDAZOLE (FLAGYL) tablet 500 mg       ? 500 mg Oral Every 12 hours 10/13/21 0837    ? 10/13/21 1000  vancomycin (VANCOREADY) IVPB 1250 mg/250 mL       ? 1,250 mg ?166.7 mL/hr over 90 Minutes Intravenous  Once 10/13/21 0837 10/13/21 1230  ? ?  ? ? ? ?DVT prophylaxis: SCDs ? ?Code Status: Full code ? ?Family Communication: Discussed with patient's husband at bedside ? ? ?CONSULTS  ? ? ?Objective  ? ? ?Physical Examination: ? ?General-appears in no acute distress ?Heart-S1-S2, regular, no murmur auscultated ?Lungs-clear to auscultation bilaterally, no wheezing or crackles auscultated ?Abdomen-soft, nontender, no organomegaly ?Extremities-no edema in the lower extremities ?Neuro-alert, oriented x3, no focal deficit noted ? ? ?Status is: Inpatient: Failure to thrive ? ? ? ?  ? ? ?Debra Huber ?  ?[facility] ?If 7PM-7AM, please contact night-coverage at [email or web address], ?Office  [phone] ? ? ?10/13/2021, 2:19 PM  LOS: 2 days  ? ? ? ? ? ? ? ? ? ? ?  ?

## 2021-10-13 NOTE — Progress Notes (Signed)
RRT called by patient's RN for red MEWS. When arrived at bedside, attending physician at bedside and placed new orders. Patient to remain on 5E and care team to call back if patient condition changes.  ?

## 2021-10-13 NOTE — H&P (View-Only) (Signed)
[facility] Progress Note ? ?Debra Huber 71 y.o. 09-07-1950 ? ?CC:  Constipation; Abnormal CT ? ? ?Subjective: ?Patient seen and examined laying in bed accompanied by fianc?.  Nurse reports 1 bowel movement today slightly mushy no blood no black stool.  Tolerating normal diet well.  Continues to have some left upper quadrant abdominal pain. ? ?ROS : Review of Systems  ?Gastrointestinal:  Positive for abdominal pain. Negative for blood in stool, constipation, diarrhea, heartburn, melena, nausea and vomiting.  ?Genitourinary:  Negative for dysuria and urgency.  ? ? ? ?Objective: ?Vital signs in last 24 hours: ?Vitals:  ? 10/13/21 0950 10/13/21 1057  ?BP: 103/79 115/83  ?Pulse: (!) 103   ?Resp: 16 18  ?Temp: 97.7 ?F (36.5 ?C)   ?SpO2: 98%   ? ? ?Physical Exam: ? ?General:  Alert, cooperative, no distress, appears stated age, thin  ?Head:  Normocephalic, without obvious abnormality, atraumatic  ?Eyes:  Anicteric sclera, EOM's intact  ?Lungs:   Clear to auscultation bilaterally, respirations unlabored  ?Heart:  Regular rate and rhythm, S1, S2 normal  ?Abdomen:   Soft, left upper quadrant and epigastric tenderness to palpation, bowel sounds active all four quadrants,  no masses,   ?Extremities: Extremities normal, atraumatic, no  edema  ?Pulses: 2+ and symmetric  ? ? ?Lab Results: ?Recent Labs  ?  10/10/21 ?1308 10/10/21 ?2240 10/11/21 ?0452 10/12/21 ?0431 10/12/21 ?3734 10/13/21 ?2876  ?NA  --  140   < > 134*  --  131*  ?K  --  3.0*   < > 2.6*  --  3.4*  ?CL  --  112*   < > 103  --  103  ?CO2  --  22   < > 25  --  23  ?GLUCOSE  --  79   < > 92  --  75  ?BUN  --  10   < > 6*  --  7*  ?CREATININE  --  0.52   < > 0.45  --  0.49  ?CALCIUM  --  6.6*   < > 6.7*  --  6.2*  ?MG  --   --   --   --  1.2* 2.2  ?PHOS 3.2 3.2  --   --   --   --   ? < > = values in this interval not displayed.  ? ?Recent Labs  ?  10/12/21 ?0431 10/13/21 ?8115  ?AST 31 29  ?ALT 17 14  ?ALKPHOS 83 65  ?BILITOT 1.0 0.7  ?PROT 5.7*  4.8*  ?ALBUMIN 2.1* 1.7*  ? ?Recent Labs  ?  10/10/21 ?1305 10/10/21 ?1722 10/11/21 ?0452  ?WBC 4.7 5.4 5.1  ?NEUTROABS 3.5 4.3  --   ?HGB 7.8* 9.0* 10.9*  ?HCT 25.5* 28.8* 36.0  ?MCV 84.4 83.0 85.1  ?PLT 106* 126* 135*  ? ?No results for input(s): LABPROT, INR in the last 72 hours. ? ? ? ?Assessment ?Fecal impaction ?Stercoral colitis ? ?Patient continues to have bowel movements today. ?Continues to have some left upper quadrant pain ?Weight loss brought up by fianc? yesterday. ? ?CT abdomen chest and pelvis 10/10/2021 noted 10 mm right upper lobe nodule along possible malignancy or metastasis given patient's history recommended PET CT scan.  Possible rectal polyp or mass hidden in the inflammation in the colon.  May need colonoscopy in the future. ? ?Plan: ?Continue supportive care ?We will discuss with Dr. Michail Sermon plans for possible colonoscopy if needed to evaluate for weight loss. ?[facility] will follow ? ?Charlott Rakes PA-C ?10/13/2021, 11:33 AM ? ?Contact #  [phone]  ?

## 2021-10-14 ENCOUNTER — Inpatient Hospital Stay (HOSPITAL_COMMUNITY): Payer: Medicare Other | Admitting: Anesthesiology

## 2021-10-14 ENCOUNTER — Encounter (HOSPITAL_COMMUNITY)
Admission: EM | Disposition: A | Discharge status: Home Health | Payer: Self-pay | Source: Home / Self Care | Attending: Internal Medicine

## 2021-10-14 DIAGNOSIS — F0390 Unspecified dementia without behavioral disturbance: Secondary | ICD-10-CM | POA: Diagnosis not present

## 2021-10-14 DIAGNOSIS — R933 Abnormal findings on diagnostic imaging of other parts of digestive tract: Secondary | ICD-10-CM

## 2021-10-14 DIAGNOSIS — R627 Adult failure to thrive: Secondary | ICD-10-CM | POA: Diagnosis not present

## 2021-10-14 DIAGNOSIS — E538 Deficiency of other specified B group vitamins: Secondary | ICD-10-CM | POA: Diagnosis not present

## 2021-10-14 DIAGNOSIS — E876 Hypokalemia: Secondary | ICD-10-CM | POA: Diagnosis not present

## 2021-10-14 DIAGNOSIS — R6510 Systemic inflammatory response syndrome (SIRS) of non-infectious origin without acute organ dysfunction: Secondary | ICD-10-CM

## 2021-10-14 DIAGNOSIS — D72819 Decreased white blood cell count, unspecified: Secondary | ICD-10-CM

## 2021-10-14 DIAGNOSIS — K5730 Diverticulosis of large intestine without perforation or abscess without bleeding: Secondary | ICD-10-CM

## 2021-10-14 DIAGNOSIS — D709 Neutropenia, unspecified: Secondary | ICD-10-CM

## 2021-10-14 DIAGNOSIS — I10 Essential (primary) hypertension: Secondary | ICD-10-CM

## 2021-10-14 DIAGNOSIS — K640 First degree hemorrhoids: Secondary | ICD-10-CM

## 2021-10-14 DIAGNOSIS — R911 Solitary pulmonary nodule: Secondary | ICD-10-CM

## 2021-10-14 HISTORY — PX: FLEXIBLE SIGMOIDOSCOPY: SHX5431

## 2021-10-14 LAB — COMPREHENSIVE METABOLIC PANEL
ALT: 19 U/L (ref 0–44)
AST: 39 U/L (ref 15–41)
Albumin: 1.9 g/dL — ABNORMAL LOW (ref 3.5–5.0)
Alkaline Phosphatase: 82 U/L (ref 38–126)
Anion gap: 5 (ref 5–15)
BUN: 8 mg/dL (ref 8–23)
CO2: 23 mmol/L (ref 22–32)
Calcium: 6.6 mg/dL — ABNORMAL LOW (ref 8.9–10.3)
Chloride: 107 mmol/L (ref 98–111)
Creatinine, Ser: 0.49 mg/dL (ref 0.44–1.00)
GFR, Estimated: >60 mL/min (ref >60)
Glucose, Bld: 82 mg/dL (ref 70–99)
Potassium: 3.1 mmol/L — ABNORMAL LOW (ref 3.5–5.1)
Sodium: 135 mmol/L (ref 135–145)
Total Bilirubin: 0.8 mg/dL (ref 0.3–1.2)
Total Protein: 5.1 g/dL — ABNORMAL LOW (ref 6.5–8.1)

## 2021-10-14 LAB — CBC
HCT: 29.1 % — ABNORMAL LOW (ref 36.0–46.0)
Hemoglobin: 9.6 g/dL — ABNORMAL LOW (ref 12.0–15.0)
MCH: 26.4 pg (ref 26.0–34.0)
MCHC: 33 g/dL (ref 30.0–36.0)
MCV: 79.9 fL — ABNORMAL LOW (ref 80.0–100.0)
Platelets: 142 10*3/uL — ABNORMAL LOW (ref 150–400)
RBC: 3.64 MIL/uL — ABNORMAL LOW (ref 3.87–5.11)
RDW: 16.3 % — ABNORMAL HIGH (ref 11.5–15.5)
WBC: 3.7 10*3/uL — ABNORMAL LOW (ref 4.0–10.5)
nRBC: 0 % (ref 0.0–0.2)

## 2021-10-14 SURGERY — SIGMOIDOSCOPY, FLEXIBLE
Anesthesia: Monitor Anesthesia Care

## 2021-10-14 MED ORDER — SODIUM CHLORIDE 0.9 % IV SOLN: INTRAVENOUS | Status: DC

## 2021-10-14 MED ORDER — POTASSIUM CHLORIDE CRYS ER 20 MEQ PO TBCR
40.0000 meq | EXTENDED_RELEASE_TABLET | Freq: Two times a day (BID) | ORAL | Status: AC
Start: 2021-10-14 — End: 2021-10-14
  Administered 2021-10-14 (×2): 40 meq via ORAL
  Filled 2021-10-14 (×2): qty 2

## 2021-10-14 MED ORDER — PHENYLEPHRINE HCL (PRESSORS) 10 MG/ML IV SOLN
INTRAVENOUS | Status: DC | PRN
  Administered 2021-10-14: 80 ug via INTRAVENOUS
  Administered 2021-10-14: 120 ug via INTRAVENOUS
  Administered 2021-10-14 (×3): 80 ug via INTRAVENOUS
  Administered 2021-10-14: 120 ug via INTRAVENOUS

## 2021-10-14 MED ORDER — PROPOFOL 500 MG/50ML IV EMUL
INTRAVENOUS | Status: DC | PRN
  Administered 2021-10-14: 100 ug/kg/min via INTRAVENOUS

## 2021-10-14 MED ORDER — PROPOFOL 10 MG/ML IV BOLUS
INTRAVENOUS | Status: DC | PRN
  Administered 2021-10-14 (×2): 10 mg via INTRAVENOUS

## 2021-10-14 MED ORDER — LIDOCAINE HCL 1 % IJ SOLN
INTRAMUSCULAR | Status: DC | PRN
  Administered 2021-10-14: 40 mg via INTRADERMAL

## 2021-10-14 MED ORDER — POTASSIUM CHLORIDE 10 MEQ/100ML IV SOLN: 10.0000 meq | INTRAVENOUS | Status: DC

## 2021-10-14 MED ORDER — CALCIUM CARBONATE 1250 (500 CA) MG PO TABS
1.0000 | ORAL_TABLET | Freq: Three times a day (TID) | ORAL | Status: AC
  Administered 2021-10-14 – 2021-10-15 (×2): 500 mg via ORAL
  Filled 2021-10-14 (×2): qty 1

## 2021-10-14 MED ORDER — SODIUM CHLORIDE 0.9 % IV SOLN: INTRAVENOUS | Status: AC

## 2021-10-14 MED ORDER — FLEET ENEMA 7-19 GM/118ML RE ENEM
2.0000 | ENEMA | Freq: Once | RECTAL | Status: AC
  Administered 2021-10-14: 2 via RECTAL
  Filled 2021-10-14: qty 2

## 2021-10-14 NOTE — Interval H&P Note (Signed)
History and Physical Interval Note: ? ?10/14/2021 ?2:16 PM ? ?Debra Huber  has presented today for surgery, with the diagnosis of Abdominal Pain h/o colon cancer.  The various methods of treatment have been discussed with the patient and family. After consideration of risks, benefits and other options for treatment, the patient has consented to  Procedure(s): ?FLEXIBLE SIGMOIDOSCOPY (N/A) as a surgical intervention.  The patient's history has been reviewed, patient examined, no change in status, stable for surgery.  I have reviewed the patient's chart and labs.  Questions were answered to the patient's satisfaction.   ? ? ?Lear Ng ? ? ?

## 2021-10-14 NOTE — Transfer of Care (Signed)
Immediate Anesthesia Transfer of Care Note ? ?Patient: Debra Huber ? ?Procedure(s) Performed: Procedure(s): ?FLEXIBLE SIGMOIDOSCOPY (N/A) ? ?Patient Location: PACU ? ?Anesthesia Type:MAC ? ?Level of Consciousness:  sedated, patient cooperative and responds to stimulation ? ?Airway & Oxygen Therapy:Patient Spontanous Breathing and Patient connected to face mask oxgen ? ?Post-op Assessment:  Report given to PACU RN and Post -op Vital signs reviewed and stable ? ?Post vital signs:  Reviewed and stable ? ?Last Vitals:  ?Vitals:  ? 10/14/21 1005 10/14/21 1347  ?BP: (!) 133/91 116/86  ?Pulse: (!) 109 (!) 103  ?Resp: 18 15  ?Temp: 36.4 ?C 36.9 ?C  ?SpO2: 96% 92%  ? ? ?Complications: No apparent anesthesia complications ? ?

## 2021-10-14 NOTE — Op Note (Signed)
[facility] ?Patient Name: Debra Huber ?Procedure Date: 10/14/2021 ?MRN: [id number] ?Attending MD: Lear Ng , MD ?Date of Birth: [birth year] ?CSN: [id number] ?Age: 71 ?Admit Type: Inpatient ?Procedure:                Flexible Sigmoidoscopy ?Indications:              Abdominal pain in the left lower quadrant, Abnormal  ?                          CT of the GI tract ?Providers:                Lear Ng, MD, Grace Isaac, RN, Cindee Salt  ?                          Teaching laboratory technician ?Referring MD:             hospital team ?Medicines:                Propofol per Anesthesia, Monitored Anesthesia Care ?Complications:            No immediate complications. ?Estimated Blood Loss:     Estimated blood loss: none. ?Procedure:                Pre-Anesthesia Assessment: ?                          - Prior to the procedure, a History and Physical  ?                          was performed, and patient medications and  ?                          allergies were reviewed. The patient's tolerance of  ?                          previous anesthesia was also reviewed. The risks  ?                          and benefits of the procedure and the sedation  ?                          options and risks were discussed with the patient.  ?                          All questions were answered, and informed consent  ?                          was obtained. Prior Anticoagulants: The patient has  ?                          taken no previous anticoagulant or antiplatelet  ?                          agents. ASA Grade Assessment: III - A patient with  ?  severe systemic disease. After reviewing the risks  ?                          and benefits, the patient was deemed in  ?                          satisfactory condition to undergo the procedure. ?                          After obtaining informed consent, the scope was  ?                          passed under direct vision. The [id number]-[id number] ([id number])  ?                           Olympus endoscope was introduced through the anus  ?                          and advanced to the the splenic flexure. The  ?                          flexible sigmoidoscopy was performed with  ?                          difficulty due to poor bowel prep with stool  ?                          present. Successful completion of the procedure was  ?                          aided by straightening and shortening the scope to  ?                          obtain bowel loop reduction. The quality of the  ?                          bowel preparation was poor. ?Scope In: ?Scope Out: ?Findings: ?     The perianal and digital rectal examinations were normal. ?     Copious quantities of semi-solid solid stool was found in the rectum, in  ?     the sigmoid colon and in the descending colon, precluding visualization. ?     Internal hemorrhoids were found during retroflexion. The hemorrhoids  ?     were small and Grade I (internal hemorrhoids that do not prolapse). ?     A few small-mouthed diverticula were found in the sigmoid colon. ?Impression:               - Preparation of the colon was poor. ?                          - Stool in the rectum, in the sigmoid colon and in  ?                          the descending colon. ?                          -  Internal hemorrhoids. ?                          - Diverticulosis in the sigmoid colon. ?                          - No specimens collected. ?Moderate Sedation: ?     N/A - MAC procedure ?Recommendation:           - Advance diet as tolerated. ?                          - Enemas. Suspect abdominal pain due to fecal  ?                          impaction and retained stool. ?Procedure Code(s):        --- Professional --- ?                          [phone], Sigmoidoscopy, flexible; diagnostic,  ?                          including collection of specimen(s) by brushing or  ?                          washing, when performed (separate procedure) ?Diagnosis Code(s):         --- Professional --- ?                          R10.32, Left lower quadrant pain ?                          R93.3, Abnormal findings on diagnostic imaging of  ?                          other parts of digestive tract ?                          K64.0, First degree hemorrhoids ?                          K57.30, Diverticulosis of large intestine without  ?                          perforation or abscess without bleeding ?CPT copyright 2019 [employer]. All rights reserved. ?The codes documented in this report are preliminary and upon coder review may  ?be revised to meet current compliance requirements. ?Lear Ng, MD ?10/14/2021 3:02:48 PM ?This report has been signed electronically. ?Number of Addenda: 0 ?

## 2021-10-14 NOTE — Progress Notes (Signed)
MD order to administer soap suds enema x2, 8 hrs apart. ? ?1st enema given at 1800. Pt was unable to hold all the enema and did have a lot of brown liquid from mid back to upper posterior thighs.  ? ?Turning pt side side for cleaning caused more bowel movement release of small 1-2 inch soft yellow balls of stool along with yellow then brown liquid.  ? ?Every time pt was prompted to push she managed to release 2-3 yellow balls of stool. This  happened 4 times within 30 minutes.  ? ?Pt verbalizes no difference in abdominal discomfort. She states, "it still feels the same." ?

## 2021-10-14 NOTE — Care Management Important Message (Signed)
Important Message ? ?Patient Details IM Letter placed in Patients room. ?Name: Debra Huber ?MRN: [id number] ?Date of Birth: 1951/01/17 ? ? ?Medicare Important Message Given:  Yes ? ? ? ? ?Kerin Salen ?10/14/2021, 11:08 AM ?

## 2021-10-14 NOTE — Anesthesia Preprocedure Evaluation (Addendum)
Anesthesia Evaluation  ?Patient identified by MRN, date of birth, ID band ?Patient awake ? ? ? ?Reviewed: ?Allergy & Precautions, NPO status , Patient's Chart, lab work & pertinent test results ? ?Airway ?Mallampati: II ? ?TM Distance: >3 FB ?Neck ROM: Full ? ? ? Dental ? ?(+) Poor Dentition ?  ?Pulmonary ?neg pulmonary ROS,  ?  ?Pulmonary exam normal ?breath sounds clear to auscultation ? ? ? ? ? ? Cardiovascular ?hypertension, Pt. on medications ?negative cardio ROS ?Normal cardiovascular exam ?Rhythm:Regular Rate:Normal ? ? ?  ?Neuro/Psych ?PSYCHIATRIC DISORDERS Dementia Answers questions appropriatelynegative neurological ROS ?   ? GI/Hepatic ?Neg liver ROS, H/o colon cancer ?  ?Endo/Other  ?Hypothyroidism Hyperthyroidism  ? Renal/GU ?Renal disease  ?negative genitourinary ?  ?Musculoskeletal ?negative musculoskeletal ROS ?(+)  ? Abdominal ?Normal abdominal exam  (+)   ?Peds ?negative pediatric ROS ?(+)  Hematology ? ?(+) Blood dyscrasia, anemia ,   ?Anesthesia Other Findings ? ? Reproductive/Obstetrics ?negative OB ROS ? ?  ? ? ? ? ? ? ? ? ? ? ? ? ? ?  ?  ? ? ? ? ? ? ?Anesthesia Physical ?Anesthesia Plan ? ?ASA: 3 ? ?Anesthesia Plan: MAC  ? ?Post-op Pain Management:   ? ?Induction: Intravenous ? ?PONV Risk Score and Plan: 2 and Propofol infusion, Treatment may vary due to age or medical condition and TIVA ? ?Airway Management Planned: Natural Airway ? ?Additional Equipment: None ? ?Intra-op Plan:  ? ?Post-operative Plan:  ? ?Informed Consent: I have reviewed the patients History and Physical, chart, labs and discussed the procedure including the risks, benefits and alternatives for the proposed anesthesia with the patient or authorized representative who has indicated his/her understanding and acceptance.  ? ? ? ?Dental advisory given ? ?Plan Discussed with: CRNA and Anesthesiologist ? ?Anesthesia Plan Comments:   ? ? ? ? ? ? ?Anesthesia Quick Evaluation ? ?

## 2021-10-14 NOTE — Progress Notes (Signed)
[facility] ?PROGRESS NOTE ? ? ? ?Progress Note  ?Debra Huber [place]  PYK:[id number] DOB: [birth year] DOA: 10/10/2021 ?PCP: Willey Blade, MD  ? ? ? ?Brief Narrative:  ? ?Debra Huber is an 71 y.o. female past medical history significant for dementia, hypothyroidism, essential hypertension brought in by the family for poor oral intake, generalized weakness and weight loss.  Denies any nausea vomiting or abdominal pain CT scan of the abdomen pelvis showed large amount of rectal stool with rectal wall thickening and perirectal stranding, question stercoral colitis, no evidence of obstruction no perianal or abscess. ? ? ?Assessment/Plan:  ? ?Sirs: ?Tmax 100.1, count 3.7. ?Procalcitonin was low yield. ?Patient was initially found hypotensive and tachycardic in the ED, lactic acid was 0.7. ?She was started empirically on Vanco cefepime and Flagyl. ?Imaging showed no acute findings. ?Urine cultures and blood cultures have been sent and have been negative till date.  Will await culture data results. ?Continue cefepime and Flagyl.   ? ?Leukopenia: ?Of unclear etiology, she has a low folate will be folate. ?Also there was a concern admission infection she is currently on cefepime and Flagyl. ? ?Stercoral colitis: ?MiraLAX and Fleet enemas had 1 small bowel movement yesterday. ?Consulted and started the colonoscopy prep for colonoscopy on 10/14/2021. ?She refused to take the bowel prep. ? ?History of colon cancer: ?Status post right colectomy a colonoscopy 10 years ago. ?During admission. ?GI has been consulted she is scheduled for colonoscopy 10/14/2021. ? ?Hypokalemia: ?Trending in the wrong direction, magnesium is 2.2 start potassium IV once able to take orals replete orally. ? ?Hypocalcemia: ?Corrected for albumin is 8.0. ?She was given IV calcium gluconate, and is improved once able to take oral start oral replacement. ? ?Microcytic anemia likely due to iron deficiency: ?Drifting down was given IV  iron gluconate.  From 12.9 on 10/08/2018 23-9.6 this morning. ?No signs of overt bleeding. ?Continue to monitor closely. ?FOBT negative, she does not tolerate her prep. ?Probably have to reschedule colonoscopy for tomorrow. ? ?Hypothyroidism: ?Continue current regimen. ? ?Dementia: ?Continue Aricept. ? ?Essential hypertension: ?Antihypertensive medications were held due to hypotension, blood pressure this morning is 114/90 heart rate is improving. ?Continue fluid resuscitation. ? ?Right upper lobe nodule: ?Seen on CT, will need follow-up with oncology as an outpatient due to her history of colon cancer. ? ? ? ? ?DVT prophylaxis: lovenox ?Family Communication:none ?Status is: Inpatient ?Remains inpatient appropriate because: SIRS of unclear cause with constipation scheduled for colonoscopy due to history of colon cancer ? ? ? ?Code Status:  ? ?  ?Code Status Orders  ?(From admission, onward)  ?  ? ? ?  ? ?  Start     Ordered  ? 10/10/21 2138  Full code  Continuous       ? 10/10/21 2137  ? ?  ?  ? ?  ? ?Code Status History   ? ? Date Active Date Inactive Code Status Order ID Comments User Context  ? 09/28/2021 2253 09/30/2021 2133 Full Code [id number]  Milton Ferguson, MD ED  ? 08/04/2021 1613 08/05/2021 2113 Full Code [id number]  Orma Flaming, MD ED  ? 02/26/2012 0526 02/28/2012 1349 Full Code [id number]  Benita Gutter, RN Inpatient  ? ?  ? ? ? ? ?IV Access:  ? ?Peripheral IV ? ? ?Procedures and diagnostic studies:  ? ?DG Chest Port 1V same Day ? ?Result Date: 10/13/2021 ?CLINICAL DATA:  Provided history: Encounter for hypoxia. EXAM: PORTABLE CHEST 1 VIEW COMPARISON:  CT chest/abdomen/pelvis 10/10/2021.  Prior chest radiographs 09/28/2021 and earlier. FINDINGS: Heart size within normal limits. Aortic atherosclerosis. A 10 mm right upper lobe pulmonary nodule was better appreciated on the recent prior chest CT of 10/10/2021. Mild ill-defined opacity within the right lung base and left mid to lower lung field with an appearance  most suggestive of atelectasis. No evidence of pleural effusion or pneumothorax. No acute bony abnormality identified. Thoracic dextrocurvature. Surgical clips within the lower neck. IMPRESSION: A 10 mm right upper lobe pulmonary nodule was better appreciated on the recent prior chest CT of 10/10/2021. Please refer to this prior examination for further description and for follow-up recommendations. Mild ill-defined opacity within the right lung base and left mid-to-lower lung field, favored to reflect atelectasis. Consider short-interval radiographic follow-up if there is concern for developing pneumonia. Aortic Atherosclerosis (ICD10-I70.0). Electronically Signed   By: Kellie Simmering D.O.   On: 10/13/2021 08:27   ? ? ?Medical Consultants:  ? ?None. ? ? ?Subjective:  ? ? ?Debra Huber patient arouses to voice sleepy related she is in the hospital she is tired, the nurse relate she has not eaten much in the last 24 hours. ? ?Objective:  ? ? ?Vitals:  ? 10/13/21 1202 10/13/21 1656 10/13/21 2105 10/14/21 0522  ?BP: 111/86 111/80 124/90 (!) 114/91  ?Pulse: (!) 108 (!) 102 (!) 110 (!) 107  ?Resp: '16 16 18 18  '$ ?Temp: (!) 97.5 ?F (36.4 ?C) 98.4 ?F (36.9 ?C) 100.1 ?F (37.8 ?C) 98.5 ?F (36.9 ?C)  ?TempSrc: Oral Oral  Oral  ?SpO2: 98% 97% 97% 92%  ?Weight:      ?Height:      ? ?SpO2: 92 % ?O2 Flow Rate (L/min): 2 L/min ? ? ?Intake/Output Summary (Last 24 hours) at 10/14/2021 0738 ?Last data filed at 10/14/2021 0600 ?Gross per 24 hour  ?Intake 1886.66 ml  ?Output 1475 ml  ?Net 411.66 ml  ? ?Filed Weights  ? 10/11/21 1116  ?Weight: 62.7 kg  ? ? ?Exam: ?General exam: In no acute distress. ?Respiratory system: Good air movement and clear to auscultation. ?Cardiovascular system: S1 & S2 heard, RRR. No JVD. ?Gastrointestinal system: Abdomen is nondistended, soft and nontender.  ?Extremities: No pedal edema. ?Skin: No rashes, lesions or ulcers ? ? ?Data Reviewed:  ? ? ?Labs: ?Basic Metabolic Panel: ?Recent Labs  ?Lab  10/10/21 ?1305 10/10/21 ?1308 10/10/21 ?2240 10/11/21 ?5929 10/12/21 ?0431 10/12/21 ?2446 10/13/21 ?2863 10/14/21 ?0456  ?NA 142  --  140 139 134*  --  131* 135  ?K 3.0*  --  3.0* 2.9* 2.6*  --  3.4* 3.1*  ?CL 114*  --  112* 109 103  --  103 107  ?CO2 23  --  22 21* 25  --  23 23  ?GLUCOSE 80  --  79 61* 92  --  75 82  ?BUN 11  --  10 9 6*  --  7* 8  ?CREATININE 0.56  --  0.52 0.46 0.45  --  0.49 0.49  ?CALCIUM 6.4*  --  6.6* 7.1* 6.7*  --  6.2* 6.6*  ?MG 1.8  --   --   --   --  1.2* 2.2  --   ?PHOS  --  3.2 3.2  --   --   --   --   --   ? ?GFR ?Estimated Creatinine Clearance: 64.5 mL/min (by C-G formula based on SCr of 0.49 mg/dL). ?Liver Function Tests: ?Recent Labs  ?Lab 10/10/21 ?1305 10/10/21 ?2240 10/11/21 ?0452 10/12/21 ?  0177 10/13/21 ?9390 10/14/21 ?0456  ?AST 31  --  '31 31 29 '$ 39  ?ALT 18  --  '18 17 14 19  '$ ?ALKPHOS 76  --  83 83 65 82  ?BILITOT 0.6  --  0.8 1.0 0.7 0.8  ?PROT 5.7*  --  5.7* 5.7* 4.8* 5.1*  ?ALBUMIN 2.2* 2.2* 2.2* 2.1* 1.7* 1.9*  ? ?No results for input(s): LIPASE, AMYLASE in the last 168 hours. ?No results for input(s): AMMONIA in the last 168 hours. ?Coagulation profile ?No results for input(s): INR, PROTIME in the last 168 hours. ?COVID-19 Labs ? ?No results for input(s): DDIMER, FERRITIN, LDH, CRP in the last 72 hours. ? ?Lab Results  ?Component Value Date  ? [place] NEGATIVE 08/04/2021  ? ? ?CBC: ?Recent Labs  ?Lab 10/07/21 ?1314 10/07/21 ?1322 10/10/21 ?1305 10/10/21 ?1722 10/11/21 ?3009 10/14/21 ?0456  ?WBC 3.7*  --  4.7 5.4 5.1 3.7*  ?NEUTROABS 3.1  --  3.5 4.3  --   --   ?HGB 11.5* 12.2 7.8* 9.0* 10.9* 9.6*  ?HCT 36.5 36.0 25.5* 28.8* 36.0 29.1*  ?MCV 82.0  --  84.4 83.0 85.1 79.9*  ?PLT 149*  --  106* 126* 135* 142*  ? ?Cardiac Enzymes: ?No results for input(s): CKTOTAL, CKMB, CKMBINDEX, TROPONINI in the last 168 hours. ?BNP (last 3 results) ?No results for input(s): PROBNP in the last 8760 hours. ?CBG: ?No results for input(s): GLUCAP in the last 168 hours. ?D-Dimer: ?No  results for input(s): DDIMER in the last 72 hours. ?Hgb A1c: ?No results for input(s): HGBA1C in the last 72 hours. ?Lipid Profile: ?No results for input(s): CHOL, HDL, LDLCALC, TRIG, CHOLHDL, LDLDIRECT in the last 72

## 2021-10-14 NOTE — Anesthesia Postprocedure Evaluation (Signed)
Anesthesia Post Note ? ?Patient: Debra Huber ? ?Procedure(s) Performed: FLEXIBLE SIGMOIDOSCOPY ? ?  ? ?Patient location during evaluation: PACU ?Anesthesia Type: MAC ?Level of consciousness: awake and alert ?Pain management: pain level controlled ?Vital Signs Assessment: post-procedure vital signs reviewed and stable ?Respiratory status: spontaneous breathing and respiratory function stable ?Cardiovascular status: stable ?Postop Assessment: no apparent nausea or vomiting ?Anesthetic complications: no ? ? ?No notable events documented. ? ?Last Vitals:  ?Vitals:  ? 10/14/21 1525 10/14/21 1530  ?BP:  118/84  ?Pulse: 97 (!) 106  ?Resp: 15 17  ?Temp:    ?SpO2: 93% 93%  ?  ?Last Pain:  ?Vitals:  ? 10/14/21 1502  ?TempSrc: Temporal  ?PainSc: 0-No pain  ? ? ?  ?  ?  ?  ?  ?  ? ?Merlinda Frederick ? ? ? ? ?

## 2021-10-14 NOTE — Progress Notes (Signed)
Patient is alert and oriented x 4. Vital signs was rechecked after the 1st attempt. Patient is green MEWS. Will monitor patient closely.  ?

## 2021-10-15 DIAGNOSIS — R627 Adult failure to thrive: Secondary | ICD-10-CM | POA: Diagnosis not present

## 2021-10-15 DIAGNOSIS — F0390 Unspecified dementia without behavioral disturbance: Secondary | ICD-10-CM | POA: Diagnosis not present

## 2021-10-15 DIAGNOSIS — E538 Deficiency of other specified B group vitamins: Secondary | ICD-10-CM | POA: Diagnosis not present

## 2021-10-15 DIAGNOSIS — E876 Hypokalemia: Secondary | ICD-10-CM | POA: Diagnosis not present

## 2021-10-15 LAB — BASIC METABOLIC PANEL
Anion gap: 5 (ref 5–15)
BUN: 7 mg/dL — ABNORMAL LOW (ref 8–23)
CO2: 21 mmol/L — ABNORMAL LOW (ref 22–32)
Calcium: 6.5 mg/dL — ABNORMAL LOW (ref 8.9–10.3)
Chloride: 110 mmol/L (ref 98–111)
Creatinine, Ser: 0.45 mg/dL (ref 0.44–1.00)
GFR, Estimated: >60 mL/min (ref >60)
Glucose, Bld: 82 mg/dL (ref 70–99)
Potassium: 3.6 mmol/L (ref 3.5–5.1)
Sodium: 136 mmol/L (ref 135–145)

## 2021-10-15 LAB — URINE CULTURE: Culture: >=100000 — AB

## 2021-10-15 MED ORDER — POLYETHYLENE GLYCOL 3350 17 GM/SCOOP PO POWD
238.0000 g | Freq: Once | ORAL | Status: AC
  Administered 2021-10-15: 238 g via ORAL
  Filled 2021-10-15: qty 238

## 2021-10-15 MED ORDER — METOPROLOL TARTRATE 12.5 MG HALF TABLET
12.5000 mg | ORAL_TABLET | Freq: Two times a day (BID) | ORAL | Status: DC
  Administered 2021-10-15 – 2021-10-19 (×8): 12.5 mg via ORAL
  Filled 2021-10-15 (×8): qty 1

## 2021-10-15 MED ORDER — POTASSIUM CHLORIDE CRYS ER 20 MEQ PO TBCR
40.0000 meq | EXTENDED_RELEASE_TABLET | Freq: Two times a day (BID) | ORAL | Status: DC
  Filled 2021-10-15: qty 2

## 2021-10-15 MED ORDER — CALCIUM CARBONATE 1250 (500 CA) MG PO TABS
1.0000 | ORAL_TABLET | Freq: Three times a day (TID) | ORAL | Status: AC
  Administered 2021-10-15 (×2): 500 mg via ORAL
  Filled 2021-10-15 (×2): qty 1

## 2021-10-15 MED ORDER — FOSFOMYCIN TROMETHAMINE 3 G PO PACK
3.0000 g | PACK | Freq: Once | ORAL | Status: AC
  Administered 2021-10-15: 3 g via ORAL
  Filled 2021-10-15: qty 3

## 2021-10-15 MED ORDER — LIP MEDEX EX OINT
TOPICAL_OINTMENT | CUTANEOUS | Status: DC | PRN
  Filled 2021-10-15: qty 7

## 2021-10-15 MED ORDER — POLYETHYLENE GLYCOL 3350 17 G PO PACK
17.0000 g | PACK | Freq: Two times a day (BID) | ORAL | Status: DC
  Administered 2021-10-15: 17 g via ORAL
  Filled 2021-10-15: qty 1

## 2021-10-15 MED ORDER — POTASSIUM CHLORIDE CRYS ER 20 MEQ PO TBCR
40.0000 meq | EXTENDED_RELEASE_TABLET | Freq: Two times a day (BID) | ORAL | Status: AC
  Administered 2021-10-15 (×2): 40 meq via ORAL
  Filled 2021-10-15 (×2): qty 2

## 2021-10-15 NOTE — Progress Notes (Signed)
[facility] ?PROGRESS NOTE ? ? ? ?Progress Note  ?Debra Huber  SWF:[id number] DOB: 12-26-1950 DOA: 10/10/2021 ?PCP: Willey Blade, MD  ? ? ? ?Brief Narrative:  ? ?Debra Huber is an 71 y.o. female past medical history significant for dementia, hypothyroidism, essential hypertension brought in by the family for poor oral intake, generalized weakness and weight loss.  Denies any nausea vomiting or abdominal pain CT scan of the abdomen pelvis showed large amount of rectal stool with rectal wall thickening and perirectal stranding, question stercoral colitis, no evidence of obstruction no perianal or abscess. ? ? ?Assessment/Plan:  ? ?Sepsis probably due to UTI. ?Tmax of 98.5, procalcitonin low yield. ?Patient was initially found hypotensive and tachycardic in the ED, lactic acid was 0.7. ?Urine culture is growing more than 100,000 colonies of Enterococcus faecalis. ?Discontinue Flagyl and cefepime. ?Give her a single dose of fosfomycin ? ?Leukopenia: ?Of unclear etiology, she has a low folate will be folate. ?Also there was a concern admission infection she is currently on cefepime and Flagyl. ? ?Stercoral colitis: ?MiraLAX and Fleet enemas had 1 small bowel movement yesterday. ?Consulted and started the colonoscopy prep for colonoscopy on 10/14/2021. ?She refused to take the bowel prep. ? ?History of colon cancer: ?Status post right colectomy a colonoscopy 10 years ago. ?She did not finish her colonoscopy.  GI date of flex sig ration of the colon was poor she has internal hemorrhoid + diverticulosis ? ?Hypokalemia: ?Continues to be low replete orally recheck tomorrow morning. ? ?Hypocalcemia: ?Corrected for albumin is 8.0. ?She was given IV calcium gluconate, and is improved once able to take oral start oral replacement. ? ?Microcytic anemia likely due to iron deficiency: ?S/p  IV iron gluconate.  From 12.9 on 10/08/2018, her hemoglobin has stabilized at 9.6. ?No signs of overt  bleeding. ? ?Hypothyroidism: ?Continue current regimen. ? ?Dementia: ?Continue Aricept. ? ?Essential hypertension: ?Antihypertensive medications were held due to hypotension, blood pressure this morning is 114/90 heart rate is improving. ?Continue fluid resuscitation. ? ?Right upper lobe nodule: ?Seen on CT, will need follow-up with oncology as an outpatient due to her history of colon cancer. ? ? ? ? ?DVT prophylaxis: lovenox ?Family Communication:none ?Status is: Inpatient ?Remains inpatient appropriate because: SIRS of unclear cause with constipation scheduled for colonoscopy due to history of colon cancer ? ? ? ?Code Status:  ? ?  ?Code Status Orders  ?(From admission, onward)  ?  ? ? ?  ? ?  Start     Ordered  ? 10/10/21 2138  Full code  Continuous       ? 10/10/21 2137  ? ?  ?  ? ?  ? ?Code Status History   ? ? Date Active Date Inactive Code Status Order ID Comments User Context  ? 09/28/2021 2253 09/30/2021 2133 Full Code [id number]  Milton Ferguson, MD ED  ? 08/04/2021 1613 08/05/2021 2113 Full Code [id number]  Orma Flaming, MD ED  ? 02/26/2012 0526 02/28/2012 1349 Full Code [id number]  Benita Gutter, RN Inpatient  ? ?  ? ? ? ? ?IV Access:  ? ?Peripheral IV ? ? ?Procedures and diagnostic studies:  ? ?No results found. ? ? ?Medical Consultants:  ? ?None. ? ? ?Subjective:  ? ? ?Debra Huber she did not tolerate the Fleet enemas.  She agrees and taking the MiraLAX ? ?Objective:  ? ? ?Vitals:  ? 10/14/21 2154 10/15/21 0338 10/15/21 0530 10/15/21 0730  ?BP:   (!) 121/91 125/89  ?Pulse: 95  Marland Kitchen)  123 (!) 113  ?Resp: '18  20 20  '$ ?Temp:  98.3 ?F (36.8 ?C) 98.2 ?F (36.8 ?C) 97.6 ?F (36.4 ?C)  ?TempSrc:  Oral  Oral  ?SpO2:   97% 98%  ?Weight:      ?Height:      ? ?SpO2: 98 % ?O2 Flow Rate (L/min): 3 L/min ? ? ?Intake/Output Summary (Last 24 hours) at 10/15/2021 0907 ?Last data filed at 10/14/2021 1005 ?Gross per 24 hour  ?Intake --  ?Output 250 ml  ?Net -250 ml  ? ? ?Filed Weights  ? 10/11/21 1116 10/14/21 1347 10/14/21  1502  ?Weight: 62.7 kg 62.7 kg 62.7 kg  ? ? ?Exam: ?General exam: In no acute distress. ?Respiratory system: Good air movement and clear to auscultation. ?Cardiovascular system: S1 & S2 heard, RRR. No JVD. ?Gastrointestinal system: Abdomen is nondistended, soft and nontender.  ?Extremities: No pedal edema. ?Skin: No rashes, lesions or ulcers ?Psychiatry: Judgement and insight appear normal. Mood & affect appropriate. ? ? ?Data Reviewed:  ? ? ?Labs: ?Basic Metabolic Panel: ?Recent Labs  ?Lab 10/10/21 ?1305 10/10/21 ?1308 10/10/21 ?2240 10/11/21 ?0350 10/12/21 ?0431 10/12/21 ?0938 10/13/21 ?1829 10/14/21 ?0456  ?NA 142  --  140 139 134*  --  131* 135  ?K 3.0*  --  3.0* 2.9* 2.6*  --  3.4* 3.1*  ?CL 114*  --  112* 109 103  --  103 107  ?CO2 23  --  22 21* 25  --  23 23  ?GLUCOSE 80  --  79 61* 92  --  75 82  ?BUN 11  --  10 9 6*  --  7* 8  ?CREATININE 0.56  --  0.52 0.46 0.45  --  0.49 0.49  ?CALCIUM 6.4*  --  6.6* 7.1* 6.7*  --  6.2* 6.6*  ?MG 1.8  --   --   --   --  1.2* 2.2  --   ?PHOS  --  3.2 3.2  --   --   --   --   --   ? ? ?GFR ?Estimated Creatinine Clearance: 64.5 mL/min (by C-G formula based on SCr of 0.49 mg/dL). ?Liver Function Tests: ?Recent Labs  ?Lab 10/10/21 ?1305 10/10/21 ?2240 10/11/21 ?9371 10/12/21 ?0431 10/13/21 ?6967 10/14/21 ?0456  ?AST 31  --  '31 31 29 '$ 39  ?ALT 18  --  '18 17 14 19  '$ ?ALKPHOS 76  --  83 83 65 82  ?BILITOT 0.6  --  0.8 1.0 0.7 0.8  ?PROT 5.7*  --  5.7* 5.7* 4.8* 5.1*  ?ALBUMIN 2.2* 2.2* 2.2* 2.1* 1.7* 1.9*  ? ? ?No results for input(s): LIPASE, AMYLASE in the last 168 hours. ?No results for input(s): AMMONIA in the last 168 hours. ?Coagulation profile ?No results for input(s): INR, PROTIME in the last 168 hours. ?COVID-19 Labs ? ?No results for input(s): DDIMER, FERRITIN, LDH, CRP in the last 72 hours. ? ?Lab Results  ?Component Value Date  ? [place] NEGATIVE 08/04/2021  ? ? ?CBC: ?Recent Labs  ?Lab 10/10/21 ?1305 10/10/21 ?1722 10/11/21 ?8938 10/14/21 ?0456  ?WBC 4.7 5.4 5.1  3.7*  ?NEUTROABS 3.5 4.3  --   --   ?HGB 7.8* 9.0* 10.9* 9.6*  ?HCT 25.5* 28.8* 36.0 29.1*  ?MCV 84.4 83.0 85.1 79.9*  ?PLT 106* 126* 135* 142*  ? ? ?Cardiac Enzymes: ?No results for input(s): CKTOTAL, CKMB, CKMBINDEX, TROPONINI in the last 168 hours. ?BNP (last 3 results) ?No results for input(s): PROBNP in the  last 8760 hours. ?CBG: ?No results for input(s): GLUCAP in the last 168 hours. ?D-Dimer: ?No results for input(s): DDIMER in the last 72 hours. ?Hgb A1c: ?No results for input(s): HGBA1C in the last 72 hours. ?Lipid Profile: ?No results for input(s): CHOL, HDL, LDLCALC, TRIG, CHOLHDL, LDLDIRECT in the last 72 hours. ?Thyroid function studies: ?No results for input(s): TSH, T4TOTAL, T3FREE, THYROIDAB in the last 72 hours. ? ?Invalid input(s): FREET3 ?Anemia work up: ?No results for input(s): VITAMINB12, FOLATE, FERRITIN, TIBC, IRON, RETICCTPCT in the last 72 hours. ?Sepsis Labs: ?Recent Labs  ?Lab 10/10/21 ?1305 10/10/21 ?1722 10/11/21 ?0452 10/13/21 ?6333 10/13/21 ?1045 10/14/21 ?0456  ?WBC 4.7 5.4 5.1  --   --  3.7*  ?LATICACIDVEN  --   --   --  0.9 1.0  --   ? ? ?Microbiology ?Recent Results (from the past 240 hour(s))  ?Urine Culture     Status: Abnormal (Preliminary result)  ? Collection Time: 10/13/21  8:05 AM  ? Specimen: Urine, Clean Catch  ?Result Value Ref Range Status  ? Specimen Description   Final  ?  URINE, CLEAN CATCH ?Performed at [facility], [place] [address], [place], [place] [address] ?  ? Special Requests   Final  ?  NONE ?Performed at [facility], [place] [address], [place], [place] [address] ?  ? Culture (A)  Final  ?  >=100,000 COLONIES/mL ENTEROCOCCUS FAECALIS ?SUSCEPTIBILITIES TO FOLLOW ?Performed at [facility], [place] [address], [place], [place] [address] ?  ? Report Status PENDING  Incomplete  ?Culture, blood (Routine X 2) w Reflex to ID Panel     Status: None (Preliminary result)  ? Collection Time: 10/13/21  8:46 AM  ? Specimen: BLOOD   ?Result Value Ref Range Status  ? Specimen Description   Final  ?  BLOOD RIGHT ANTECUBITAL ?Performed at [facility], [place] [address], [place], [place] [address] ?  ? Special Requests IN PEDIATRI

## 2021-10-15 NOTE — Progress Notes (Signed)
[facility] Progress Note ? ?Debra Huber [place] 71 y.o. 09-21-1950 ? ? ?Subjective: ?Nurse reported that pt not holding enemas in long enough. Patient resting this morning when I saw her. ? ?Objective: ?Vital signs: ?Vitals:  ? 10/15/21 0730 10/15/21 0930  ?BP: 125/89 (!) 125/99  ?Pulse: (!) 113 (!) 115  ?Resp: 20 16  ?Temp: 97.6 ?F (36.4 ?C) 97.7 ?F (36.5 ?C)  ?SpO2: 98% 100%  ? ? ?Physical Exam: ?Gen: lethargic, thin, no acute distress  ?HEENT: anicteric sclera ?CV: RRR ?Chest: CTA B ?Abd: LLQ tenderness with guarding, soft, nondistended, +BS ?Ext: no edema ? ?Lab Results: ?Recent Labs  ?  10/13/21 ?8110 10/14/21 ?0456 10/15/21 ?0949  ?NA 131* 135 136  ?K 3.4* 3.1* 3.6  ?CL 103 107 110  ?CO2 23 23 21*  ?GLUCOSE 75 82 82  ?BUN 7* 8 7*  ?CREATININE 0.49 0.49 0.45  ?CALCIUM 6.2* 6.6* 6.5*  ?MG 2.2  --   --   ? ?Recent Labs  ?  10/13/21 ?3159 10/14/21 ?0456  ?AST 29 39  ?ALT 14 19  ?ALKPHOS 65 82  ?BILITOT 0.7 0.8  ?PROT 4.8* 5.1*  ?ALBUMIN 1.7* 1.9*  ? ?Recent Labs  ?  10/14/21 ?0456  ?WBC 3.7*  ?HGB 9.6*  ?HCT 29.1*  ?MCV 79.9*  ?PLT 142*  ? ? ? ? ?Assessment/Plan: ?Obstipation - aggressive PO laxatives since not able to keep enemas in. Manage conservatively. No plans to repeat sigmoidoscopy. Supportive care. Will sign off. Call if questions. ? ? ?Lear Ng ?10/15/2021, 12:30 PM ? ?Questions please call [phone] Patient ID: Debra Huber, female   DOB: [birth year], 71 y.o.   MRN: [id number] ? ?

## 2021-10-15 NOTE — Progress Notes (Signed)
Upon assessment patients left extremity noted to be swollen, IV noted to have infiltrated. Unable to gain IV access. IV team consulted. Peripheral IV removed.  ?

## 2021-10-15 NOTE — Progress Notes (Signed)
Attempted to give pt repeat soap suds enema this AM. Pt unable to tolerate -- could not hold in enema. Pt was placed on bedpan following but only minimal liquid, brown stool produced. Plan of care ongoing.  ?

## 2021-10-15 NOTE — Progress Notes (Signed)
PT Cancellation Note ? ?Patient Details ?Name: Debra Huber ?MRN: [id number] ?DOB: 04-Jul-1950 ? ? ?Cancelled Treatment:    Reason Eval/Treat Not Completed: Medical issues which prohibited therapy.  Pt is low on labs, retry at another time. ? ? ?Ramond Dial ?10/15/2021, 10:00 AM ? ?Mee Hives, PT PhD ?Acute Rehab Dept. Number: [facility] [phone] and [place] [phone] ? ?

## 2021-10-15 NOTE — Progress Notes (Signed)
?   10/15/21 0530  ?Assess: MEWS Score  ?Temp 98.2 ?F (36.8 ?C)  ?BP (!) 121/91  ?Pulse Rate (!) 123  ?Resp 20  ?SpO2 97 %  ?Assess: MEWS Score  ?MEWS Temp 0  ?MEWS Systolic 0  ?MEWS Pulse 2  ?MEWS RR 0  ?MEWS LOC 0  ?MEWS Score 2  ?MEWS Score Color Yellow  ?Assess: if the MEWS score is Yellow or Red  ?Were vital signs taken at a resting state? Yes  ?Focused Assessment Change from prior assessment (see assessment flowsheet)  ?Does the patient meet 2 or more of the SIRS criteria? Yes  ?Does the patient have a confirmed or suspected source of infection? Yes  ?Provider and Rapid Response Notified? Yes  ?MEWS guidelines implemented *See Row Information* No, previously yellow, continue vital signs every 4 hours  ?Treat  ?Pain Scale 0-10  ?Pain Score 0  ?Breathing 0  ?Negative Vocalization 0  ?Facial Expression 0  ?Body Language 0  ?Take Vital Signs  ?Increase Vital Sign Frequency  Yellow: Q 2hr X 2 then Q 4hr X 2, if remains yellow, continue Q 4hrs  ?Escalate  ?MEWS: Escalate Yellow: discuss with charge nurse/RN and consider discussing with provider and RRT  ?Notify: Charge Nurse/RN  ?Name of Charge Nurse/RN Notified Kerry Dory, RN  ?Date Charge Nurse/RN Notified 10/15/21  ?Time Charge Nurse/RN Notified 0530  ?Notify: Provider  ?Provider Name/Title Gershon Cull, NP  ?Date Provider Notified 10/15/21  ?Time Provider Notified 0530  ?Notification Type Page  ?Notification Reason Other (Comment) ?(mews yellow d/t elevated HR)  ?Provider response Other (Comment) ?(awaiting response)  ?Date of Provider Response 10/15/21  ?Time of Provider Response [phone]  ?Document  ?Patient Outcome Stabilized after interventions  ?Progress note created (see row info) Yes  ?Assess: SIRS CRITERIA  ?SIRS Temperature  0  ?SIRS Pulse 1  ?SIRS Respirations  0  ?SIRS WBC 0  ?SIRS Score Sum  1  ? ?Patient is yellow mews d/t elevated HR. Agricultural consultant and MD notified. Yellows mews protocol initiated.  ?

## 2021-10-16 ENCOUNTER — Inpatient Hospital Stay (HOSPITAL_COMMUNITY): Payer: Medicare Other

## 2021-10-16 ENCOUNTER — Encounter (HOSPITAL_COMMUNITY): Payer: Self-pay | Admitting: Gastroenterology

## 2021-10-16 DIAGNOSIS — E538 Deficiency of other specified B group vitamins: Secondary | ICD-10-CM | POA: Diagnosis not present

## 2021-10-16 DIAGNOSIS — R627 Adult failure to thrive: Secondary | ICD-10-CM | POA: Diagnosis not present

## 2021-10-16 DIAGNOSIS — E876 Hypokalemia: Secondary | ICD-10-CM | POA: Diagnosis not present

## 2021-10-16 DIAGNOSIS — K5900 Constipation, unspecified: Secondary | ICD-10-CM | POA: Diagnosis not present

## 2021-10-16 DIAGNOSIS — Z7189 Other specified counseling: Secondary | ICD-10-CM | POA: Diagnosis not present

## 2021-10-16 DIAGNOSIS — F0390 Unspecified dementia without behavioral disturbance: Secondary | ICD-10-CM | POA: Diagnosis not present

## 2021-10-16 LAB — BASIC METABOLIC PANEL
Anion gap: 4 — ABNORMAL LOW (ref 5–15)
Anion gap: 5 (ref 5–15)
BUN: 10 mg/dL (ref 8–23)
BUN: 10 mg/dL (ref 8–23)
CO2: 22 mmol/L (ref 22–32)
CO2: 20 mmol/L — ABNORMAL LOW (ref 22–32)
Calcium: 6.9 mg/dL — ABNORMAL LOW (ref 8.9–10.3)
Calcium: 6.6 mg/dL — ABNORMAL LOW (ref 8.9–10.3)
Chloride: 110 mmol/L (ref 98–111)
Chloride: 112 mmol/L — ABNORMAL HIGH (ref 98–111)
Creatinine, Ser: 0.52 mg/dL (ref 0.44–1.00)
Creatinine, Ser: 0.44 mg/dL (ref 0.44–1.00)
GFR, Estimated: >60 mL/min (ref >60)
GFR, Estimated: >60 mL/min (ref >60)
Glucose, Bld: 74 mg/dL (ref 70–99)
Glucose, Bld: 73 mg/dL (ref 70–99)
Potassium: 4.6 mmol/L (ref 3.5–5.1)
Potassium: 4.3 mmol/L (ref 3.5–5.1)
Sodium: 136 mmol/L (ref 135–145)
Sodium: 137 mmol/L (ref 135–145)

## 2021-10-16 LAB — MAGNESIUM: Magnesium: 1.7 mg/dL (ref 1.7–2.4)

## 2021-10-16 MED ORDER — PEG-KCL-NACL-NASULF-NA ASC-C 100 G PO SOLR
1.0000 | Freq: Once | ORAL | Status: AC
  Administered 2021-10-16: 200 g via ORAL
  Filled 2021-10-16: qty 1

## 2021-10-16 MED ORDER — SODIUM CHLORIDE 0.9 % IV SOLN: INTRAVENOUS | Status: AC

## 2021-10-16 NOTE — Progress Notes (Signed)
RN encouraged pt to finish MoviPrep. Pt with full container remaining at beginning of shift, along with an additional container to be completed.  ?Pt drank 360 mL of MoviPrep and stated "I do not want anymore I am not drinking any more." Will continue to monitor. ?

## 2021-10-16 NOTE — Progress Notes (Signed)
Orthopedic Tech Progress Note ?Patient Details:  ?Kathrine Haddock [place] ?1950-10-24 ?[id number] ? ?Patient ID: Malia Corsi, female   DOB: 10/20/1950, 71 y.o.   MRN: [id number] ? ?[place] Pink ?10/16/2021, 4:41 PMCalled and Routed bilateral ankle contracture  boot with walking sole order to [employer]. ? ?

## 2021-10-16 NOTE — Plan of Care (Signed)
  Problem: Nutrition: Goal: Adequate nutrition will be maintained Outcome: Progressing   Problem: Pain Managment: Goal: General experience of comfort will improve Outcome: Progressing   Problem: Safety: Goal: Ability to remain free from injury will improve Outcome: Progressing   

## 2021-10-16 NOTE — Evaluation (Signed)
Physical Therapy Evaluation ?Patient Details ?Name: Debra Huber ?MRN: [id number] ?DOB: Sep 07, 1950 ?Today's Date: 10/16/2021 ? ?History of Present Illness ? Patient is 71 y.o. female brought in from SNF [facility]) for poor oral intake, generalized weakness and weight loss. PMH significant for dementia, HTN, hypothyroidism, colon cancer s/p colectomy, Graves Disease. Pt here recently  4/17-4/19 due to weakness and needing placement and then 4/26 for fall at facility. ? ?  ?Clinical Impression ? Zuha Dejonge is 71 y.o. female admitted with above HPI and diagnosis. Patient is currently limited by functional impairments below (see PT problem list). Patient has had decline in function over the last month and was able to stand with min assist at the start of April. Pt now requires Max Assist to complete bed mobility and has severe and profound weakness of bil UE/LE with hypotonia throughout with the exception of spasticity in the wrist and elbows of pt's UE's. Pt has had significant change in mentation per MD and was unable to answer and questions appropriately however was able to state occasional needs with one word ie. "water", "ouch". No findings present on CT of brain. Given profound weakness throughout all extremities and altered mentation I feel there is concern for possible infection around spine, discussed concern with MD. Patient will benefit from continued skilled PT interventions to address impairments and progress independence with mobility, recommending AIR given significant change in function and families goals to return home with pt, they can provide 24/7 assist. Acute PT will follow and progress as able.    ?   ? ?Recommendations for follow up therapy are one component of a multi-disciplinary discharge planning process, led by the attending physician.  Recommendations may be updated based on patient status, additional functional criteria and insurance authorization. ? ?PT  Recommendation   ?Recommendations for Other Services Rehab consult, OT consult, Speech consult Filed 10/16/2021 1057  ?Follow Up Recommendations Acute inpatient rehab (3hours/day)  [pending medical findings and progress] Filed 10/16/2021 1057  ?Assistance recommended at discharge Frequent or constant Supervision/Assistance Filed 10/16/2021 1057  ?Patient can return home with the following Two people to help with walking and/or transfers, Direct supervision/assist for medications management, Direct supervision/assist for financial management, Two people to help with bathing/dressing/bathroom, Assistance with cooking/housework, Assistance with feeding, Assist for transportation, Help with stairs or ramp for entrance Filed 10/16/2021 1057  ?Functional Status Assessment Patient has had a recent decline in their functional status and demonstrates the ability to make significant improvements in function in a reasonable and predictable amount of time. Filed 10/16/2021 1057  ?PT equipment Wheelchair (measurements PT), Wheelchair cushion (measurements PT), Hospital bed Filed 10/16/2021 1057  ? ? ? ?Mobility ? Bed Mobility ?Overal bed mobility: Needs Assistance ?Bed Mobility: Supine to Sit, Sit to Supine ?  ?  ?Supine to sit: Max assist, +2 for physical assistance, +2 for safety/equipment ?Sit to supine: Total assist, +2 for physical assistance, +2 for safety/equipment ?  ?General bed mobility comments: Pt with no initiation to reach for thearpist hand or bed rail. able to grip therapist hand but profoundly weak. Total assist for LE's off/onto bed. pt required Max support for trunk initially and then mod assist with bil UE support from pt's spouse and therapist. pt able to hold head up. ?  ? ?Transfers ?  ?  ?  ?  ?  ?  ?  ?  ?  ?General transfer comment: Unsafe to test at this time ?  ? ?Ambulation/Gait ?  ?  ?  ?  ?  ?  ?  ?  ? ?  Stairs ?  ?  ?  ?  ?  ? ?Wheelchair Mobility ?  ? ?Modified Rankin (Stroke Patients Only) ?  ? ?   ? ?Balance Overall balance assessment: Needs assistance ?Sitting-balance support: Bilateral upper extremity supported, Feet unsupported ?Sitting balance-Leahy Scale: Zero ?  ?  ?  ?Standing balance-Leahy Scale: Zero ?  ?  ?  ?  ?  ?  ?  ?  ?  ?  ?  ?  ?   ? ? ? ?Pertinent Vitals/Pain Pain Assessment ?Pain Assessment: Faces ?Faces Pain Scale: Hurts a little bit ?Breathing: normal ?Negative Vocalization: occasional moan/groan, low speech, negative/disapproving quality ?Facial Expression: smiling or inexpressive ?Body Language: relaxed ?Consolability: no need to console ?PAINAD Score: 1 ?Pain Location: pt stating wrist with reflex/ROM testing ?Pain Descriptors / Indicators: Discomfort, Grimacing ?Pain Intervention(s): Repositioned, Monitored during session, Limited activity within patient's tolerance  ? ? ? 10/16/21 1057  ?Home Living  ?Family/patient expects to be discharged to: Skilled nursing facility ?([facility])  ?Living Arrangements Spouse/significant other  ?Available Help at Discharge Available 24 hours/day;Family  ?Type of Home House ?(townhouse)  ?Home Access Stairs to enter  ?Entrance Stairs-Number of Steps 1/2 step/transition at door  ?Entrance Stairs-Rails None  ?Home Layout Able to live on main level with bedroom/bathroom;Two level  ?Bathroom Shower/Tub Walk-in shower ?(with a curb)  ?Bathroom Toilet Standard  ?Bathroom Accessibility No  ?Home Scientist, research (life sciences) (2 wheels);Shower seat  ?Additional Comments pt's family is hoping to take her home, pt has had significant change however and family will need to be able to provide 24/7 Max Assist  ?Prior Function  ?Prior Level of Function  Needs assist  ?Mobility Comments Per pt's husband: pt was ambulatory with a walker up until ~ the beginning of April. per chart review (last OT note in 07/2021)-Min A for standing, pt unable to take more than 2 small steps with RW at that time.  ?ADLs Comments required assistance for bathing, dressing.  husband helps with sink bath  ? ? ? 10/16/21 1057  ?Upper Extremity Assessment  ?Upper Extremity Assessment RUE deficits/detail;LUE deficits/detail;Defer to OT evaluation  ?RUE Deficits / Details hypotonic throughout with spasticity noted with varying speed for wrist flexion/extension and elbow extension. pt not initaiting any movement at wrist or hand, significant weakness for gross grasp.  ?RUE Coordination decreased gross motor;decreased fine motor  ?LUE Deficits / Details hypotonic throughout with spasticity noted with varying speed for wrist flexion/extension and elbow extension. pt not initaiting any movement at wrist or hand, significant weakness for gross grasp.  ?LUE Coordination decreased gross motor;decreased fine motor  ?Lower Extremity Assessment  ?Lower Extremity Assessment Generalized weakness;RLE deficits/detail;LLE deficits/detail  ?RLE Deficits / Details 0/5 with no activaiton for ankle, knee, or hip mobility. Pt with hypotonicity and no spasticity but rather almost hyporeflexive at ankle. ankles resting in plantar flexed position.  ?RLE Sensation decreased proprioception  ?RLE Coordination decreased fine motor;decreased gross motor  ?LLE Deficits / Details 0/5 with no activaiton for ankle, knee, or hip mobility. Pt with hypotonicity and no spasticity but rather almost hyporeflexive at ankle. ankles resting in plantar flexed position.  ?LLE Sensation decreased proprioception  ?LLE Coordination decreased gross motor;decreased fine motor  ?Cervical / Trunk Assessment  ?Cervical / Trunk Assessment Other exceptions  ?Cervical / Trunk Exceptions grossly weak at trunk with poor endurance to hold posture upright sitting EOB.  ? ?Communication  ?  Expressive difficulties;  ?Cognition Arousal/Alertness: Awake/alert ?Behavior During Therapy: Flat affect ?Overall Cognitive  Status: Impaired/Different from baseline ?Area of Impairment: Attention, Orientation, Memory, Following commands, Safety/judgement,  Awareness, Problem solving ?  ?  ?  ?  ?  ?  ?  ?  ?Orientation Level: Disoriented to, Place, Time, Situation ?Current Attention Level: Focused ?Memory: Decreased recall of precautions, Decreased short-term memory ?Followi

## 2021-10-16 NOTE — Progress Notes (Signed)
10/16/2021 [employer] tech regarding boots. Per [employer] has to bring and apply boots. Tech Financial controller for boots. ?

## 2021-10-16 NOTE — Consult Note (Signed)
?Consultation Note ?Date: 10/16/2021  ? ?Patient Name: Debra Huber  ?DOB: 1951/01/08  MRN: [id number]  Age / Sex: 71 y.o., female  ?PCP: Willey Blade, MD ?Referring Physician: Charlynne Cousins, MD ? ?Reason for Consultation: Establishing goals of care ? ?HPI/Patient Profile: 71 y.o. female  with past medical history of dementia, hypothyroidism, colon cancer, and essential hypertension  admitted on 10/10/2021 with  poor oral intake, generalized weakness and weight loss. CT scan of the abdomen pelvis showed large amount of rectal stool with rectal wall thickening and perirectal stranding, concerning for stercoral colitis. Patient treated for sepsis likely d/t UTI.  Patient also with right upper lobe lung nodule plans for oncology follow-up outpatient.  On a.m. of 5/5 patient with sudden change in behavior and blank stare; improved as the day went on  -CT head negative. PMT consulted to discuss goals of care. ? ?Clinical Assessment and Goals of Care: ?I have reviewed medical records including EPIC notes, labs and imaging, received report from RN, assessed the patient and then met with patient, significant other, sister, brother-in-law to discuss diagnosis prognosis, GOC, EOL wishes, disposition and options. ? ?Patient confused during my visit - unable to contribute to goals of care conversation. Fixates on telling me about her sister.  ? ?I introduced Palliative Medicine as specialized medical care for people living with serious illness. It focuses on providing relief from the symptoms and stress of a serious illness. The goal is to improve quality of life for both the patient and the family. ? ?Sig other tells me they have been together for the past 15 years. He tells me she lives with him. He tells me prior to admission patient was at [facility] but this was short term rehab  -only there about ten days. He tells me at her baseline she uses a walker for  ambulation and needs some assistance for basic daily cares. He tells me she is dependent on him for feeding. He tells me of declined PO intake. Family tells me of cognitive changes at home and worsening. ? ? We discussed patient's current illness and what it means in the larger context of patient's on-going co-morbidities.   ? ?I attempted to assess decision maker - sig other tells me patient has said she would want him to make medical decisions for him though he is not sure if he has documentation of this. Other family in room seems to agree. We discuss that next of kin is technically patient's son however family reports he has not really been involved in medical care and would be unable to serve as surrogate decision maker.  ? ?I attempted to elicit values and goals of care important to the patient.  Sig other tells me they havent discussed this much so he is unsure.  ? ?Attempted to discuss code status but sig other asks that I not discuss this as the topic makes him uncomfortable - we briefly reviewed the importance of understanding patient's wishes to ensure she receives the type of care in line with her goals. He tells me he is just not in the mood to discuss this today. I did introduce a MOST form to him and asked him to review it and consider how patient would answer as he is able.  ? ?Discussed with family the importance of continued conversation with family and the medical providers regarding overall plan of care and treatment options, ensuring decisions are within the context of the patient?s values and GOCs.   ? ?  Family shares their plans to take patient home following this admission. ? ?Per RN ,plans for moviprep administration this afternoon.  ? ?Questions and concerns were addressed. The family was encouraged to call with questions or concerns. ? ?Primary Decision Maker ?Sig other states patient has previously deemed him as decision maker but he is unsure if he has paperwork, son is next of kin but  family reports he is not very involved ?  ? ?SUMMARY OF RECOMMENDATIONS   ?- Sig other a bit resistant to discussing goals of care with me, politely asked me to not proceed with conversation ?- MOST given for family to review and consider ?- family with plans to take patient back home at dc, want to avoid rehab ?- consider outpatient palliative referral? ? ?Code Status/Advance Care Planning: ?Full code ? ?Discharge Planning: Home with Palliative Services ? ? ?  ? ?Primary Diagnoses: ?Present on Admission: ? Hypokalemia ? Failure to thrive in adult ? Hypertension ? ? ?I have reviewed the medical record, interviewed the patient and family, and examined the patient. The following aspects are pertinent. ? ?Past Medical History:  ?Diagnosis Date  ? Anemia   ? Blood transfusion without reported diagnosis   ? Colon cancer ([place]) dx'd 11/2009  ? S/P HEMI-COLECTOMY AND FINISHED CHEMO 11/24/10 -DR. ODOGWU  ? Complication of anesthesia   ? ALWAYS FEELS VERY SLEEPY WAKING UP  ? Hypertension   ? Hyperthyroidism   ? GRAVES DISEASE  ? Thyroid disease   ? ?Social History  ? ?Socioeconomic History  ? Marital status: Single  ?  Spouse name: Not on file  ? Number of children: Not on file  ? Years of education: Not on file  ? Highest education level: Not on file  ?Occupational History  ? Not on file  ?Tobacco Use  ? Smoking status: Never  ? Smokeless tobacco: Never  ? Tobacco comments:  ?  Pt states she has been exposed to lots of secondhand smoke.    ?Substance and Sexual Activity  ? Alcohol use: No  ? Drug use: No  ? Sexual activity: Yes  ?Other Topics Concern  ? Not on file  ?Social History Narrative  ? Not on file  ? ?Social Determinants of Health  ? ?Financial Resource Strain: Not on file  ?Food Insecurity: Not on file  ?Transportation Needs: Not on file  ?Physical Activity: Not on file  ?Stress: Not on file  ?Social Connections: Not on file  ? ?Family History  ?Problem Relation Age of Onset  ? Cancer Father   ?     prostat  ?  Hypertension Sister   ? Thyroid disease Sister   ? Hypertension Brother   ? Hypertension Sister   ? Thyroid disease Sister   ? Hypertension Sister   ? Hypertension Sister   ? Hypertension Sister   ? Hypertension Sister   ? Hypertension Brother   ? ?Scheduled Meds: ? aspirin EC  81 mg Oral Daily  ? docusate sodium  100 mg Oral BID  ? donepezil  10 mg Oral QPM  ? feeding supplement  237 mL Oral TID WC  ? folic acid  1 mg Oral Daily  ? levothyroxine  112 mcg Oral QAC breakfast  ? metoprolol tartrate  12.5 mg Oral BID  ? mirtazapine  15 mg Oral QHS  ? multivitamin with minerals  1 tablet Oral Daily  ? peg 3350 powder  1 kit Oral Once  ? thyroid  90 mg Oral QAC breakfast  ? ?  Continuous Infusions: ? sodium chloride 125 mL/hr at 10/16/21 1310  ? ?PRN Meds:.lip balm ?Allergies  ?Allergen Reactions  ? Penicillins Swelling  ?  Did PCN reaction causing immediate rash, facial/tongue/throat swelling, SOB or lightheadedness with hypotension: Yes ?Did PCN reaction causing severe rash involving mucus membranes or skin necrosis: unknown ?Did PCN reaction that required hospitalization Yes- was in the hospital and i was given penicillin and had a reaction ?Did PCN reaction occurring within the last 10 years: No, about 15 years or more ?If all of the above answers are "NO", then may proceed with Cephalosporin use. ?  ? ?Review of Systems  ?Unable to perform ROS: Dementia  ? ?Physical Exam ?Constitutional:   ?   General: She is not in acute distress. ?   Appearance: She is ill-appearing.  ?   Comments: frail  ?Pulmonary:  ?   Effort: Pulmonary effort is normal.  ?Skin: ?   General: Skin is warm and dry.  ?Neurological:  ?   Mental Status: She is alert. She is disoriented.  ? ? ?Vital Signs: BP (!) 139/96 (BP Location: Left Arm)   Pulse 100   Temp 98.4 ?F (36.9 ?C) (Oral)   Resp 20   Ht 5' 7"  (1.702 m)   Wt 62.7 kg   SpO2 94%   BMI 21.65 kg/m?  ?Pain Scale: 0-10 ?  ?Pain Score: 0-No pain ? ? ?SpO2: SpO2: 94 % ?O2 Device:SpO2: 94  % ?O2 Flow Rate: .O2 Flow Rate (L/min): 2 L/min ? ?IO: Intake/output summary:  ?Intake/Output Summary (Last 24 hours) at 10/16/2021 1456 ?Last data filed at 10/16/2021 0909 ?Gross per 24 hour  ?Intake 926.42 ml  ?Output

## 2021-10-16 NOTE — Progress Notes (Signed)
[facility] ?PROGRESS NOTE ? ? ? ?Progress Note  ?Debra Huber  KZS:[id number] DOB: Oct 14, 1950 DOA: 10/10/2021 ?PCP: Debra Blade, MD  ? ? ? ?Brief Narrative:  ? ?Debra Huber is an 71 y.o. female past medical history significant for dementia, hypothyroidism, essential hypertension brought in by the family for poor oral intake, generalized weakness and weight loss.  Denies any nausea vomiting or abdominal pain CT scan of the abdomen pelvis showed large amount of rectal stool with rectal wall thickening and perirectal stranding, question stercoral colitis, no evidence of obstruction no perianal or abscess. ? ? ?Assessment/Plan:  ? ?Sepsis probably due to UTI. ?Tmax of 98.5, procalcitonin low yield. ?She has completed course of antibiotics in house. ? ?Leukopenia: ?Of unclear etiology, she has a low folate will be folate. ? ?Stercoral colitis: ?She has not been able to take the GoLytely. ?Only small bowel movement yesterday. ?We will go ahead and give her MoviPrep orally today.  We will need to document how many bowel movements in the mL ?She refused to take the bowel prep. ? ?History of colon cancer: ?Status post right colectomy a colonoscopy 10 years ago. ? ?Hypokalemia: ?Continues to be low replete orally recheck tomorrow morning. ? ?Hypocalcemia: ?Corrected for albumin is 8.0. ?She was given IV calcium gluconate, and is improved once able to take oral start oral replacement. ? ?Microcytic anemia likely due to iron deficiency: ?S/p  IV iron gluconate.  From 12.9 on 10/08/2018, her hemoglobin has stabilized at 9.6. ?No signs of overt bleeding. ? ?Hypothyroidism: ?Continue current regimen. ? ?Vascular dementia: ?Continue Aricept. ? ?Essential hypertension: ?Antihypertensive medications were held due to hypotension, blood pressure this morning is 114/90 heart rate is improving. ?Continue fluid resuscitation. ? ?Right upper lobe nodule: ?Seen on CT, will need follow-up with oncology as  an outpatient due to her history of colon cancer. ? ?Acute metabolic encephalopathy: ?Sudden change in her behavior today not eating with a blank stare not able to communicate. ?We will get a CT scan of her head rule out a CVA. ? ? ?DVT prophylaxis: lovenox ?Family Communication:none ?Status is: Inpatient ?Remains inpatient appropriate because: SIRS of unclear cause with constipation scheduled for colonoscopy due to history of colon cancer ? ? ? ?Code Status:  ? ?  ?Code Status Orders  ?(From admission, onward)  ?  ? ? ?  ? ?  Start     Ordered  ? 10/10/21 2138  Full code  Continuous       ? 10/10/21 2137  ? ?  ?  ? ?  ? ?Code Status History   ? ? Date Active Date Inactive Code Status Order ID Comments User Context  ? 09/28/2021 2253 09/30/2021 2133 Full Code [id number]  Milton Ferguson, MD ED  ? 08/04/2021 1613 08/05/2021 2113 Full Code [id number]  Orma Flaming, MD ED  ? 02/26/2012 0526 02/28/2012 1349 Full Code [id number]  Benita Gutter, RN Inpatient  ? ?  ? ? ? ? ?IV Access:  ? ?Peripheral IV ? ? ?Procedures and diagnostic studies:  ? ?No results found. ? ? ?Medical Consultants:  ? ?None. ? ? ?Subjective:  ? ? ?Debra Huber nonverbal this morning not able to communicate. ? ?Objective:  ? ? ?Vitals:  ? 10/15/21 1330 10/15/21 1708 10/15/21 2231 10/16/21 0543  ?BP: 121/87 138/88 116/83 (!) 131/100  ?Pulse: (!) 104 95 (!) 104 97  ?Resp: '18 20 16 16  '$ ?Temp: 97.9 ?F (36.6 ?C) 98.7 ?F (37.1 ?C) 99.5 ?F (37.5 ?  C) 98.7 ?F (37.1 ?C)  ?TempSrc: Oral Oral  Oral  ?SpO2: 100% 98% 99% 99%  ?Weight:      ?Height:      ? ?SpO2: 99 % ?O2 Flow Rate (L/min): 2 L/min ?FiO2 (%): (!) 3 % ? ? ?Intake/Output Summary (Last 24 hours) at 10/16/2021 1054 ?Last data filed at 10/16/2021 0909 ?Gross per 24 hour  ?Intake 1046.42 ml  ?Output 150 ml  ?Net 896.42 ml  ? ? ?Filed Weights  ? 10/11/21 1116 10/14/21 1347 10/14/21 1502  ?Weight: 62.7 kg 62.7 kg 62.7 kg  ? ? ?Exam: ?General exam: In no acute distress. ?Respiratory system: Good air  movement and clear to auscultation. ?Cardiovascular system: S1 & S2 heard, RRR. No JVD. ?Gastrointestinal system: Abdomen is nondistended, soft and nontender.  ?Extremities: No pedal edema. ?Skin: No rashes, lesions or ulcers ?Psychiatry: No judgment or insight of medical condition. ? ? ?Data Reviewed:  ? ? ?Labs: ?Basic Metabolic Panel: ?Recent Labs  ?Lab 10/10/21 ?1305 10/10/21 ?1308 10/10/21 ?2240 10/11/21 ?6387 10/12/21 ?0431 10/12/21 ?5643 10/13/21 ?3295 10/14/21 ?0456 10/15/21 ?1884 10/16/21 ?1660  ?NA 142  --  140   < > 134*  --  131* 135 136 136  ?K 3.0*  --  3.0*   < > 2.6*  --  3.4* 3.1* 3.6 4.6  ?CL 114*  --  112*   < > 103  --  103 107 110 110  ?CO2 23  --  22   < > 25  --  23 23 21* 22  ?GLUCOSE 80  --  79   < > 92  --  75 82 82 74  ?BUN 11  --  10   < > 6*  --  7* 8 7* 10  ?CREATININE 0.56  --  0.52   < > 0.45  --  0.49 0.49 0.45 0.52  ?CALCIUM 6.4*  --  6.6*   < > 6.7*  --  6.2* 6.6* 6.5* 6.9*  ?MG 1.8  --   --   --   --  1.2* 2.2  --   --   --   ?PHOS  --  3.2 3.2  --   --   --   --   --   --   --   ? < > = values in this interval not displayed.  ? ? ?GFR ?Estimated Creatinine Clearance: 64.5 mL/min (by C-G formula based on SCr of 0.52 mg/dL). ?Liver Function Tests: ?Recent Labs  ?Lab 10/10/21 ?1305 10/10/21 ?2240 10/11/21 ?6301 10/12/21 ?0431 10/13/21 ?6010 10/14/21 ?0456  ?AST 31  --  '31 31 29 '$ 39  ?ALT 18  --  '18 17 14 19  '$ ?ALKPHOS 76  --  83 83 65 82  ?BILITOT 0.6  --  0.8 1.0 0.7 0.8  ?PROT 5.7*  --  5.7* 5.7* 4.8* 5.1*  ?ALBUMIN 2.2* 2.2* 2.2* 2.1* 1.7* 1.9*  ? ? ?No results for input(s): LIPASE, AMYLASE in the last 168 hours. ?No results for input(s): AMMONIA in the last 168 hours. ?Coagulation profile ?No results for input(s): INR, PROTIME in the last 168 hours. ?COVID-19 Labs ? ?No results for input(s): DDIMER, FERRITIN, LDH, CRP in the last 72 hours. ? ?Lab Results  ?Component Value Date  ? [place] NEGATIVE 08/04/2021  ? ? ?CBC: ?Recent Labs  ?Lab 10/10/21 ?1305 10/10/21 ?1722  10/11/21 ?9323 10/14/21 ?0456  ?WBC 4.7 5.4 5.1 3.7*  ?NEUTROABS 3.5 4.3  --   --   ?  HGB 7.8* 9.0* 10.9* 9.6*  ?HCT 25.5* 28.8* 36.0 29.1*  ?MCV 84.4 83.0 85.1 79.9*  ?PLT 106* 126* 135* 142*  ? ? ?Cardiac Enzymes: ?No results for input(s): CKTOTAL, CKMB, CKMBINDEX, TROPONINI in the last 168 hours. ?BNP (last 3 results) ?No results for input(s): PROBNP in the last 8760 hours. ?CBG: ?No results for input(s): GLUCAP in the last 168 hours. ?D-Dimer: ?No results for input(s): DDIMER in the last 72 hours. ?Hgb A1c: ?No results for input(s): HGBA1C in the last 72 hours. ?Lipid Profile: ?No results for input(s): CHOL, HDL, LDLCALC, TRIG, CHOLHDL, LDLDIRECT in the last 72 hours. ?Thyroid function studies: ?No results for input(s): TSH, T4TOTAL, T3FREE, THYROIDAB in the last 72 hours. ? ?Invalid input(s): FREET3 ?Anemia work up: ?No results for input(s): VITAMINB12, FOLATE, FERRITIN, TIBC, IRON, RETICCTPCT in the last 72 hours. ?Sepsis Labs: ?Recent Labs  ?Lab 10/10/21 ?1305 10/10/21 ?1722 10/11/21 ?0452 10/13/21 ?7106 10/13/21 ?1045 10/14/21 ?0456  ?WBC 4.7 5.4 5.1  --   --  3.7*  ?LATICACIDVEN  --   --   --  0.9 1.0  --   ? ? ?Microbiology ?Recent Results (from the past 240 hour(s))  ?Urine Culture     Status: Abnormal  ? Collection Time: 10/13/21  8:05 AM  ? Specimen: Urine, Clean Catch  ?Result Value Ref Range Status  ? Specimen Description   Final  ?  URINE, CLEAN CATCH ?Performed at [facility], [place] [address], [place], [place] [address] ?  ? Special Requests   Final  ?  NONE ?Performed at [facility], [place] [address], [place], [place] [address] ?  ? Culture >=100,000 COLONIES/mL ENTEROCOCCUS FAECALIS (A)  Final  ? Report Status 10/15/2021 FINAL  Final  ? Organism ID, Bacteria ENTEROCOCCUS FAECALIS (A)  Final  ?    Susceptibility  ? Enterococcus faecalis - MIC*  ?  AMPICILLIN <=2 SENSITIVE Sensitive   ?  NITROFURANTOIN <=16 SENSITIVE Sensitive   ?  VANCOMYCIN 1 SENSITIVE Sensitive    ?  * >=100,000 COLONIES/mL ENTEROCOCCUS FAECALIS  ?Culture, blood (Routine X 2) w Reflex to ID Panel     Status: None (Preliminary result)  ? Collection Time: 10/13/21  8:46 AM  ? Specimen: BLOOD  ?Result Value Ref

## 2021-10-16 NOTE — Plan of Care (Signed)

## 2021-10-17 ENCOUNTER — Inpatient Hospital Stay (HOSPITAL_COMMUNITY): Payer: Medicare Other

## 2021-10-17 DIAGNOSIS — R627 Adult failure to thrive: Secondary | ICD-10-CM | POA: Diagnosis not present

## 2021-10-17 DIAGNOSIS — E538 Deficiency of other specified B group vitamins: Secondary | ICD-10-CM | POA: Diagnosis not present

## 2021-10-17 DIAGNOSIS — F0390 Unspecified dementia without behavioral disturbance: Secondary | ICD-10-CM | POA: Diagnosis not present

## 2021-10-17 DIAGNOSIS — E876 Hypokalemia: Secondary | ICD-10-CM | POA: Diagnosis not present

## 2021-10-17 NOTE — Plan of Care (Signed)

## 2021-10-17 NOTE — Progress Notes (Signed)
[facility] ?PROGRESS NOTE ? ? ? ?Progress Note  ?Debra Huber [place]  FIE:[id number] DOB: Nov 09, 1950 DOA: 10/10/2021 ?PCP: Willey Blade, MD  ? ? ? ?Brief Narrative:  ? ?Debra Huber is an 71 y.o. female past medical history significant for dementia, hypothyroidism, essential hypertension brought in by the family for poor oral intake, generalized weakness and weight loss.  Denies any nausea vomiting or abdominal pain CT scan of the abdomen pelvis showed large amount of rectal stool with rectal wall thickening and perirectal stranding, question stercoral colitis, no evidence of obstruction no perianal or abscess. ? ? ?Assessment/Plan:  ? ?Sepsis probably due to UTI. ?Tmax of 98.5, procalcitonin low yield. ?She has completed course of antibiotics in house. ? ?Leukopenia: ?Of unclear etiology, she has a low folate will be folate. ? ?Stercoral colitis: ?She has not been able to take the GoLytely. ?Only small bowel movement yesterday. ?She has refused MoviPrep the nurse will try to work with her today. ? ?History of colon cancer: ?Status post right colectomy a colonoscopy 10 years ago. ? ?Hypokalemia: ?Continues to be low replete orally recheck tomorrow morning. ? ?Hypocalcemia: ?Corrected for albumin is 8.0. ?She was given IV calcium gluconate, and is improved once able to take oral start oral replacement. ? ?Microcytic anemia likely due to iron deficiency: ?S/p  IV iron gluconate.  From 12.9 on 10/08/2018, her hemoglobin has stabilized at 9.6. ?No signs of overt bleeding. ? ?Hypothyroidism: ?Continue current regimen. ? ?Vascular dementia: ?Continue Aricept. ? ?Essential hypertension: ?Antihypertensive medications were held due to hypotension, blood pressure this morning is 114/90 heart rate is improving. ?Continue fluid resuscitation. ? ?Right upper lobe nodule: ?Seen on CT, will need follow-up with oncology as an outpatient due to her history of colon cancer. ? ?Acute metabolic  encephalopathy: ?CT of the head showed no acute findings. ?MRI of the brain is pending. ? ? ?DVT prophylaxis: lovenox ?Family Communication:none ?Status is: Inpatient ?Remains inpatient appropriate because: SIRS of unclear cause with constipation scheduled for colonoscopy due to history of colon cancer ? ? ? ?Code Status:  ? ?  ?Code Status Orders  ?(From admission, onward)  ?  ? ? ?  ? ?  Start     Ordered  ? 10/10/21 2138  Full code  Continuous       ? 10/10/21 2137  ? ?  ?  ? ?  ? ?Code Status History   ? ? Date Active Date Inactive Code Status Order ID Comments User Context  ? 09/28/2021 2253 09/30/2021 2133 Full Code [id number]  Milton Ferguson, MD ED  ? 08/04/2021 1613 08/05/2021 2113 Full Code [id number]  Orma Flaming, MD ED  ? 02/26/2012 0526 02/28/2012 1349 Full Code [id number]  Benita Gutter, RN Inpatient  ? ?  ? ? ? ? ?IV Access:  ? ?Peripheral IV ? ? ?Procedures and diagnostic studies:  ? ?CT HEAD WO CONTRAST (5MM) ? ?Result Date: 10/16/2021 ?CLINICAL DATA:  Altered mental status EXAM: CT HEAD WITHOUT CONTRAST TECHNIQUE: Contiguous axial images were obtained from the base of the skull through the vertex without intravenous contrast. RADIATION DOSE REDUCTION: This exam was performed according to the departmental dose-optimization program which includes automated exposure control, adjustment of the mA and/or kV according to patient size and/or use of iterative reconstruction technique. COMPARISON:  Head CT dated October 07, 2021 FINDINGS: Brain: Chronic white matter ischemic change. No evidence of acute infarction, hemorrhage, hydrocephalus, extra-axial collection or mass lesion/mass effect. Vascular: No hyperdense vessel or unexpected calcification. Skull:  Normal. Negative for fracture or focal lesion. Sinuses/Orbits: No acute finding. Other: None. IMPRESSION: No acute intracranial abnormality. Electronically Signed   By: Yetta Glassman M.D.   On: 10/16/2021 12:15   ? ? ?Medical Consultants:   ? ?None. ? ? ?Subjective:  ? ? ?Debra Huber verbal today able to communicate total change from yesterday. ? ?Objective:  ? ? ?Vitals:  ? 10/16/21 0543 10/16/21 1303 10/16/21 2102 10/17/21 0520  ?BP: (!) 131/100 (!) 139/96 (!) 139/103 (!) 143/96  ?Pulse: 97 100 91 90  ?Resp: '16 20 16 16  '$ ?Temp: 98.7 ?F (37.1 ?C) 98.4 ?F (36.9 ?C) 97.6 ?F (36.4 ?C) 98.5 ?F (36.9 ?C)  ?TempSrc: Oral Oral Oral Oral  ?SpO2: 99% 94% 98% 97%  ?Weight:      ?Height:      ? ?SpO2: 97 % ?O2 Flow Rate (L/min): 1 L/min ?FiO2 (%): (!) 3 % ? ? ?Intake/Output Summary (Last 24 hours) at 10/17/2021 0914 ?Last data filed at 10/16/2021 2220 ?Gross per 24 hour  ?Intake 1541.69 ml  ?Output 500 ml  ?Net 1041.69 ml  ? ? ?Filed Weights  ? 10/11/21 1116 10/14/21 1347 10/14/21 1502  ?Weight: 62.7 kg 62.7 kg 62.7 kg  ? ? ?Exam: ?General exam: In no acute distress. ?Respiratory system: Good air movement and clear to auscultation. ?Cardiovascular system: S1 & S2 heard, RRR. No JVD. ?Gastrointestinal system: Abdomen is nondistended, soft and nontender.  ?Extremities: No pedal edema. ?Skin: No rashes, lesions or ulcers ? ?Data Reviewed:  ? ? ?Labs: ?Basic Metabolic Panel: ?Recent Labs  ?Lab 10/10/21 ?1305 10/10/21 ?1308 10/10/21 ?2240 10/11/21 ?0569 10/12/21 ?7948 10/13/21 ?0165 10/14/21 ?0456 10/15/21 ?5374 10/16/21 ?8270 10/16/21 ?2154  ?NA 142  --  140   < >  --  131* 135 136 136 137  ?K 3.0*  --  3.0*   < >  --  3.4* 3.1* 3.6 4.6 4.3  ?CL 114*  --  112*   < >  --  103 107 110 110 112*  ?CO2 23  --  22   < >  --  23 23 21* 22 20*  ?GLUCOSE 80  --  79   < >  --  75 82 82 74 73  ?BUN 11  --  10   < >  --  7* 8 7* 10 10  ?CREATININE 0.56  --  0.52   < >  --  0.49 0.49 0.45 0.52 0.44  ?CALCIUM 6.4*  --  6.6*   < >  --  6.2* 6.6* 6.5* 6.9* 6.6*  ?MG 1.8  --   --   --  1.2* 2.2  --   --   --  1.7  ?PHOS  --  3.2 3.2  --   --   --   --   --   --   --   ? < > = values in this interval not displayed.  ? ? ?GFR ?Estimated Creatinine Clearance: 64.5 mL/min (by  C-G formula based on SCr of 0.44 mg/dL). ?Liver Function Tests: ?Recent Labs  ?Lab 10/10/21 ?1305 10/10/21 ?2240 10/11/21 ?7867 10/12/21 ?0431 10/13/21 ?5449 10/14/21 ?0456  ?AST 31  --  '31 31 29 '$ 39  ?ALT 18  --  '18 17 14 19  '$ ?ALKPHOS 76  --  83 83 65 82  ?BILITOT 0.6  --  0.8 1.0 0.7 0.8  ?PROT 5.7*  --  5.7* 5.7* 4.8* 5.1*  ?ALBUMIN 2.2* 2.2* 2.2* 2.1* 1.7*  1.9*  ? ? ?No results for input(s): LIPASE, AMYLASE in the last 168 hours. ?No results for input(s): AMMONIA in the last 168 hours. ?Coagulation profile ?No results for input(s): INR, PROTIME in the last 168 hours. ?COVID-19 Labs ? ?No results for input(s): DDIMER, FERRITIN, LDH, CRP in the last 72 hours. ? ?Lab Results  ?Component Value Date  ? [place] NEGATIVE 08/04/2021  ? ? ?CBC: ?Recent Labs  ?Lab 10/10/21 ?1305 10/10/21 ?1722 10/11/21 ?4562 10/14/21 ?0456  ?WBC 4.7 5.4 5.1 3.7*  ?NEUTROABS 3.5 4.3  --   --   ?HGB 7.8* 9.0* 10.9* 9.6*  ?HCT 25.5* 28.8* 36.0 29.1*  ?MCV 84.4 83.0 85.1 79.9*  ?PLT 106* 126* 135* 142*  ? ? ?Cardiac Enzymes: ?No results for input(s): CKTOTAL, CKMB, CKMBINDEX, TROPONINI in the last 168 hours. ?BNP (last 3 results) ?No results for input(s): PROBNP in the last 8760 hours. ?CBG: ?No results for input(s): GLUCAP in the last 168 hours. ?D-Dimer: ?No results for input(s): DDIMER in the last 72 hours. ?Hgb A1c: ?No results for input(s): HGBA1C in the last 72 hours. ?Lipid Profile: ?No results for input(s): CHOL, HDL, LDLCALC, TRIG, CHOLHDL, LDLDIRECT in the last 72 hours. ?Thyroid function studies: ?No results for input(s): TSH, T4TOTAL, T3FREE, THYROIDAB in the last 72 hours. ? ?Invalid input(s): FREET3 ?Anemia work up: ?No results for input(s): VITAMINB12, FOLATE, FERRITIN, TIBC, IRON, RETICCTPCT in the last 72 hours. ?Sepsis Labs: ?Recent Labs  ?Lab 10/10/21 ?1305 10/10/21 ?1722 10/11/21 ?0452 10/13/21 ?5638 10/13/21 ?1045 10/14/21 ?0456  ?WBC 4.7 5.4 5.1  --   --  3.7*  ?LATICACIDVEN  --   --   --  0.9 1.0  --    ? ? ?Microbiology ?Recent Results (from the past 240 hour(s))  ?Urine Culture     Status: Abnormal  ? Collection Time: 10/13/21  8:05 AM  ? Specimen: Urine, Clean Catch  ?Result Value Ref Range Status  ? Specimen Description   Final  ?  URINE, CLEAN C

## 2021-10-18 DIAGNOSIS — Z7189 Other specified counseling: Secondary | ICD-10-CM

## 2021-10-18 DIAGNOSIS — Z515 Encounter for palliative care: Secondary | ICD-10-CM

## 2021-10-18 DIAGNOSIS — R627 Adult failure to thrive: Secondary | ICD-10-CM | POA: Diagnosis not present

## 2021-10-18 DIAGNOSIS — K5900 Constipation, unspecified: Secondary | ICD-10-CM | POA: Diagnosis not present

## 2021-10-18 DIAGNOSIS — F0390 Unspecified dementia without behavioral disturbance: Secondary | ICD-10-CM | POA: Diagnosis not present

## 2021-10-18 LAB — CBC
HCT: 30.1 % — ABNORMAL LOW (ref 36.0–46.0)
Hemoglobin: 9.7 g/dL — ABNORMAL LOW (ref 12.0–15.0)
MCH: 26.1 pg (ref 26.0–34.0)
MCHC: 32.2 g/dL (ref 30.0–36.0)
MCV: 81.1 fL (ref 80.0–100.0)
Platelets: 199 10*3/uL (ref 150–400)
RBC: 3.71 MIL/uL — ABNORMAL LOW (ref 3.87–5.11)
RDW: 17.5 % — ABNORMAL HIGH (ref 11.5–15.5)
WBC: 2.6 10*3/uL — ABNORMAL LOW (ref 4.0–10.5)
nRBC: 0 % (ref 0.0–0.2)

## 2021-10-18 LAB — CULTURE, BLOOD (ROUTINE X 2)
Culture: NO GROWTH
Culture: NO GROWTH
Special Requests: ADEQUATE
Special Requests: ADEQUATE

## 2021-10-18 LAB — TYPE AND SCREEN
ABO/RH(D): O POS
Antibody Screen: NEGATIVE

## 2021-10-18 MED ORDER — PEG-KCL-NACL-NASULF-NA ASC-C 100 G PO SOLR
0.5000 | Freq: Once | ORAL | Status: DC
  Filled 2021-10-18: qty 1

## 2021-10-18 MED ORDER — DOCUSATE SODIUM 50 MG/5ML PO LIQD
100.0000 mg | Freq: Two times a day (BID) | ORAL | Status: DC
  Administered 2021-10-19: 100 mg via ORAL
  Filled 2021-10-18 (×4): qty 10

## 2021-10-18 MED ORDER — METOPROLOL TARTRATE 5 MG/5ML IV SOLN
2.5000 mg | Freq: Once | INTRAVENOUS | Status: AC
  Administered 2021-10-18: 2.5 mg via INTRAVENOUS
  Filled 2021-10-18: qty 5

## 2021-10-18 MED ORDER — PEG-KCL-NACL-NASULF-NA ASC-C 100 G PO SOLR: 0.5000 | Freq: Once | ORAL | Status: DC

## 2021-10-18 MED ORDER — ASPIRIN 81 MG PO CHEW
81.0000 mg | CHEWABLE_TABLET | Freq: Every day | ORAL | Status: DC
  Administered 2021-10-19: 81 mg via ORAL
  Filled 2021-10-18: qty 1

## 2021-10-18 MED ORDER — MINERAL OIL PO OIL
300.0000 mL | TOPICAL_OIL | Freq: Once | ORAL | Status: AC
  Administered 2021-10-18: 300 mL via RECTAL
  Filled 2021-10-18: qty 90

## 2021-10-18 MED ORDER — PEG-KCL-NACL-NASULF-NA ASC-C 100 G PO SOLR: 1.0000 | Freq: Once | ORAL | Status: DC

## 2021-10-18 NOTE — Progress Notes (Signed)
Orthopedic Tech Progress Note ?Patient Details:  ?Debra Huber ?June 27, 1950 ?[id number] ? ?Ortho Devices ?Ortho Device/Splint Location: Heel boot, I called [employer] on friday 10/16/21 for bilateral ankle contrature boots, [employer] olny had one. The nurse called 10/18/21 for a second boot.  I applied one of Ortho's ankle contracture boots. ?Ortho Device/Splint Interventions: Application ?  ?Post Interventions ?Patient Tolerated: Well ?Instructions Provided: Care of device ? ?[place] Pink ?10/18/2021, 11:54 AM ? ?

## 2021-10-18 NOTE — Progress Notes (Signed)
[facility] ?PROGRESS NOTE ? ? ? ?Progress Note  ?Debra Huber  PJA:[id number] DOB: 01/02/1951 DOA: 10/10/2021 ?PCP: Willey Blade, MD  ? ? ? ?Brief Narrative:  ? ?Debra Huber is an 71 y.o. female past medical history significant for dementia, hypothyroidism, essential hypertension brought in by the family for poor oral intake, generalized weakness and weight loss.  Denies any nausea vomiting or abdominal pain CT scan of the abdomen pelvis showed large amount of rectal stool with rectal wall thickening and perirectal stranding, question stercoral colitis, no evidence of obstruction no perianal or abscess. ? ? ?Assessment/Plan:  ? ?Sepsis probably due to UTI. ?Tmax of 98.5, procalcitonin low yield. ?She has completed course of antibiotics in house. ?Physical therapy evaluated the patient will need skilled nursing facility placement. ?Awaiting skilled nursing facility placement ? ?Leukopenia: ?Of unclear etiology, she has a low folate will be folate. ? ?Stercoral colitis: ?She has not been able to take the GoLytely. ?Only small bowel movement yesterday. ?She has refused MoviPrep the nurse will try to work with her today. ? ?History of colon cancer: ?Status post right colectomy a colonoscopy 10 years ago. ? ?Hypokalemia: ?Continues to be low replete orally recheck tomorrow morning. ? ?Hypocalcemia: ?Corrected for albumin is 8.0. ?She was given IV calcium gluconate, and is improved once able to take oral start oral replacement. ? ?Microcytic anemia likely due to iron deficiency: ?S/p  IV iron gluconate.  From 12.9 on 10/08/2018, her hemoglobin has stabilized at 9.6. ?No signs of overt bleeding. ? ?Hypothyroidism: ?Continue current regimen. ? ?Vascular dementia: ?Continue Aricept. ? ?Essential hypertension: ?Antihypertensive medications were held due to hypotension, blood pressure this morning is 114/90 heart rate is improving. ?Continue fluid resuscitation. ? ?Right upper lobe  nodule: ?Seen on CT, will need follow-up with oncology as an outpatient due to her history of colon cancer. ? ?Acute metabolic encephalopathy: ?CT of the head showed no acute findings. ?MRI of the brain is pending. ? ? ?DVT prophylaxis: lovenox ?Family Communication:none ?Status is: Inpatient ?Remains inpatient appropriate because: SIRS of unclear cause with constipation scheduled for colonoscopy due to history of colon cancer ? ? ? ?Code Status:  ? ?  ?Code Status Orders  ?(From admission, onward)  ?  ? ? ?  ? ?  Start     Ordered  ? 10/10/21 2138  Full code  Continuous       ? 10/10/21 2137  ? ?  ?  ? ?  ? ?Code Status History   ? ? Date Active Date Inactive Code Status Order ID Comments User Context  ? 09/28/2021 2253 09/30/2021 2133 Full Code [id number]  Milton Ferguson, MD ED  ? 08/04/2021 1613 08/05/2021 2113 Full Code [id number]  Orma Flaming, MD ED  ? 02/26/2012 0526 02/28/2012 1349 Full Code [id number]  Benita Gutter, RN Inpatient  ? ?  ? ? ? ? ?IV Access:  ? ?Peripheral IV ? ? ?Procedures and diagnostic studies:  ? ?CT HEAD WO CONTRAST (5MM) ? ?Result Date: 10/16/2021 ?CLINICAL DATA:  Altered mental status EXAM: CT HEAD WITHOUT CONTRAST TECHNIQUE: Contiguous axial images were obtained from the base of the skull through the vertex without intravenous contrast. RADIATION DOSE REDUCTION: This exam was performed according to the departmental dose-optimization program which includes automated exposure control, adjustment of the mA and/or kV according to patient size and/or use of iterative reconstruction technique. COMPARISON:  Head CT dated October 07, 2021 FINDINGS: Brain: Chronic white matter ischemic change. No evidence of acute infarction,  hemorrhage, hydrocephalus, extra-axial collection or mass lesion/mass effect. Vascular: No hyperdense vessel or unexpected calcification. Skull: Normal. Negative for fracture or focal lesion. Sinuses/Orbits: No acute finding. Other: None. IMPRESSION: No acute intracranial  abnormality. Electronically Signed   By: Yetta Glassman M.D.   On: 10/16/2021 12:15  ? ?MR BRAIN WO CONTRAST ? ?Result Date: 10/17/2021 ?CLINICAL DATA:  71 year old female with altered mental status. EXAM: MRI HEAD WITHOUT CONTRAST TECHNIQUE: Multiplanar, multiecho pulse sequences of the brain and surrounding structures were obtained without intravenous contrast. COMPARISON:  Head CT yesterday. Brain MRI 01/04/2018. FINDINGS: Brain: Susceptibility artifact associated with numerous chronic microhemorrhages, most concentrated in the bilateral thalami, brainstem, and deep cerebellum. No restricted diffusion to suggest acute infarction. No midline shift, mass effect, evidence of mass lesion, ventriculomegaly, extra-axial collection or acute intracranial hemorrhage. Cervicomedullary junction and pituitary are within normal limits. Superimposed Patchy and confluent bilateral cerebral white matter T2 and FLAIR hyperintensity with occasional small areas of cortical encephalomalacia (posterior left superior frontal gyrus series 11, image 39). Chronic deep gray matter nuclei, and occasional brainstem and cerebellar lacunar infarcts. Vascular: Major intracranial vascular flow voids are stable since 2019. Chronic intracranial artery tortuosity. Skull and upper cervical spine: Negative for age visible cervical spine. Visualized bone marrow signal is within normal limits. Sinuses/Orbits: Stable, negative. Other: Grossly negative visible internal auditory structures. Negative visible scalp and face. IMPRESSION: 1. No acute intracranial abnormality. 2. Advanced chronic signal changes in the brain with numerous chronic microhemorrhages most compatible with severe small vessel disease. Hemosiderin deposition has not significantly progressed since 2019 and amyloid angiopathy remains less likely. Electronically Signed   By: Genevie Ann M.D.   On: 10/17/2021 12:08   ? ? ?Medical Consultants:  ? ?None. ? ? ?Subjective:  ? ? ?[name] relates she is not hungry this morning. ? ?Objective:  ? ? ?Vitals:  ? 10/17/21 0520 10/17/21 1356 10/17/21 1955 10/18/21 0552  ?BP: (!) 143/96 (!) 129/93 126/89 (!) 151/99  ?Pulse: 90 87 89 84  ?Resp: '16 16 18 16  '$ ?Temp: 98.5 ?F (36.9 ?C) 98 ?F (36.7 ?C) 98.2 ?F (36.8 ?C) 98.4 ?F (36.9 ?C)  ?TempSrc: Oral  Oral Oral  ?SpO2: 97% 98% 100% 100%  ?Weight:      ?Height:      ? ?SpO2: 100 % ?O2 Flow Rate (L/min): 1 L/min ?FiO2 (%): (!) 3 % ? ? ?Intake/Output Summary (Last 24 hours) at 10/18/2021 0906 ?Last data filed at 10/18/2021 4034 ?Gross per 24 hour  ?Intake 120 ml  ?Output 800 ml  ?Net -680 ml  ? ? ?Filed Weights  ? 10/11/21 1116 10/14/21 1347 10/14/21 1502  ?Weight: 62.7 kg 62.7 kg 62.7 kg  ? ? ?Exam: ?General exam: In no acute distress. ?Respiratory system: Good air movement and clear to auscultation. ?Cardiovascular system: S1 & S2 heard, RRR. No JVD. ?Gastrointestinal system: Abdomen is nondistended, soft and nontender.  ?Extremities: No pedal edema. ?Skin: No rashes, lesions or ulcers ?Psychiatry: Judgement and insight appear normal. Mood & affect appropriate. ? ?Data Reviewed:  ? ? ?Labs: ?Basic Metabolic Panel: ?Recent Labs  ?Lab 10/12/21 ?7425 10/13/21 ?9563 10/14/21 ?0456 10/15/21 ?8756 10/16/21 ?4332 10/16/21 ?2154  ?NA  --  131* 135 136 136 137  ?K  --  3.4* 3.1* 3.6 4.6 4.3  ?CL  --  103 107 110 110 112*  ?CO2  --  23 23 21* 22 20*  ?GLUCOSE  --  75 82 82 74 73  ?BUN  --  7* 8 7* 10 10  ?CREATININE  --  0.49 0.49 0.45 0.52 0.44  ?CALCIUM  --  6.2* 6.6* 6.5* 6.9* 6.6*  ?MG 1.2* 2.2  --   --   --  1.7  ? ? ?GFR ?Estimated Creatinine Clearance: 64.5 mL/min (by C-G formula based on SCr of 0.44 mg/dL). ?Liver Function Tests: ?Recent Labs  ?Lab 10/12/21 ?0431 10/13/21 ?0413 10/14/21 ?0456  ?AST 31 29 39  ?ALT '17 14 19  '$ ?ALKPHOS 83 65 82  ?BILITOT 1.0 0.7 0.8  ?PROT 5.7* 4.8* 5.1*  ?ALBUMIN 2.1* 1.7* 1.9*  ? ? ?No results for input(s): LIPASE, AMYLASE in the last 168 hours. ?No results for input(s):  AMMONIA in the last 168 hours. ?Coagulation profile ?No results for input(s): INR, PROTIME in the last 168 hours. ?COVID-19 Labs ? ?No results for input(s): DDIMER, FERRITIN, LDH, CRP in the last 72 hours. ? ?Lab Results  ?Co

## 2021-10-18 NOTE — Plan of Care (Signed)

## 2021-10-18 NOTE — Progress Notes (Signed)
Inpatient Rehab Admissions Coordinator Note:  ? ?Per PT patient was screened for CIR candidacy by Liddy Deam Danford Bad, CCC-SLP. At this time, pt has not yet attempted transfers. Also note in the palliative care note, that family mentioned they want to avoid rehab. Pt may have potential to progress to becoming a potential CIR candidate. CIR admissions team will follow for progress and tolerance with therapies and place consult order if appropriate.  ? ? ?Gayland Curry, MS, CCC-SLP ?Admissions Coordinator ?[phone] ?10/18/21 ?10:20 AM ? ?

## 2021-10-18 NOTE — Evaluation (Signed)
Occupational Therapy Evaluation ?Patient Details ?Name: Debra Huber ?MRN: [id number] ?DOB: 22-Aug-1950 ?Today's Date: 10/18/2021 ? ? ?History of Present Illness Patient is 71 y.o. female brought in from SNF [facility]) for poor oral intake, generalized weakness and weight loss. PMH significant for dementia, HTN, hypothyroidism, colon cancer s/p colectomy, Graves Disease. Pt here recently  4/17-4/19 due to weakness and needing placement and then 4/26 for fall at facility.  ? ?Clinical Impression ?  ?Debra Huber is a 71 year old woman who presents with above medical history. On evaluation she presents with profound weakness, poor activity tolerance, presumed impaired balance and cognitive deficits. On evaluation she is initially sleepy with difficulty participating. With prolonged stimulation she awakened and with persistent encouragement she became more participatory. She exhibits decreased ROM, strength and coordination of upper extremities - with more weakness and less functional use in left than right. Difficulty bilaterally to perform wrist movement but more so on the left. She is max assist to roll left and right and she required mod-total assist for grooming, bathing and dressing at bed level. Treatment focused on UE exercises and movement/use of Les and feeding task. Patient able to drink from cup and feed herself 4 bites while therapist in room using right hand. At end of treatment patient more alert with eyes open and more engaged with feeding task.Patient will benefit from skilled OT services while in hospital to improve deficits and learn compensatory strategies as needed in order to improve functional strength and abilities.  ? ?AIR would be a great discharge plan for patient as she demonstrates the need, the ability to participate and family assistance. However, patient's significant other fairly adamant about taking patient home. If he continues to be insistent on home recommend  Hospital bed, wheelchair and maximize [facility] services.  ?   ? ?Recommendations for follow up therapy are one component of a multi-disciplinary discharge planning process, led by the attending physician.  Recommendations may be updated based on patient status, additional functional criteria and insurance authorization.  ? ?Follow Up Recommendations ? Acute inpatient rehab (3hours/day)  ?  ?Assistance Recommended at Discharge Frequent or constant Supervision/Assistance  ?Patient can return home with the following Two people to help with walking and/or transfers;A lot of help with bathing/dressing/bathroom;Assistance with cooking/housework;Assistance with feeding;Direct supervision/assist for medications management;Help with stairs or ramp for entrance;Assist for transportation;Direct supervision/assist for financial management ? ?  ?Functional Status Assessment ? Patient has had a recent decline in their functional status and demonstrates the ability to make significant improvements in function in a reasonable and predictable amount of time.  ?Equipment Recommendations ? Hospital bed;Wheelchair cushion (measurements OT)  ?  ?Recommendations for Other Services   ? ? ?  ?Precautions / Restrictions Precautions ?Precautions: Fall ?Restrictions ?Weight Bearing Restrictions: No  ? ?  ? ?Mobility Bed Mobility ?Overal bed mobility: Needs Assistance ?Bed Mobility: Rolling ?Rolling: Max assist ?  ?  ?  ?  ?General bed mobility comments: Patient max assist to roll left and right in bed did not attempt supine to sit with on 1 assist. ?  ? ?Transfers ?  ?  ?  ?  ?  ?  ?  ?  ?  ?  ?  ? ?  ?Balance   ?  ?  ?  ?  ?  ?  ?  ?  ?  ?  ?  ?  ?  ?  ?  ?  ?  ?  ?   ? ?  ADL either performed or assessed with clinical judgement  ? ?ADL Overall ADL's : Needs assistance/impaired ?Eating/Feeding: Set up;Bed level ?Eating/Feeding Details (indicate cue type and reason): with increased time, setup and encouragment patient able to drink from ensure  container and straw, lean forward to sip from cup with tea and feed herself bites of mashed potatoes ?Grooming: Moderate assistance;Set up;Bed level;Wash/dry face ?  ?Upper Body Bathing: Maximal assistance;Bed level ?  ?Lower Body Bathing: Total assistance;Bed level ?  ?Upper Body Dressing : Total assistance;Bed level ?  ?Lower Body Dressing: Total assistance;Bed level ?  ?  ?Toilet Transfer Details (indicate cue type and reason): unable ?Toileting- Clothing Manipulation and Hygiene: Total assistance;Bed level ?  ?  ?  ?  ?   ? ? ? ?Vision   ?Vision Assessment?: No apparent visual deficits  ?   ?Perception   ?  ?Praxis   ?  ? ?Pertinent Vitals/Pain Pain Assessment ?Pain Assessment: Faces ?Faces Pain Scale: Hurts a little bit ?Pain Location: L wrist with flexion ?Pain Descriptors / Indicators: Discomfort, Grimacing ?Pain Intervention(s): Monitored during session  ? ? ? ?Hand Dominance Right ?  ?Extremity/Trunk Assessment Upper Extremity Assessment ?Upper Extremity Assessment: RUE deficits/detail;LUE deficits/detail ?RUE Deficits / Details: 45 degrees shoulder flexion otherwise needs active assist to raise, gross elbow ROM, 4/5 bicep, 4-/5 tricep, 4+/5 wrist, grip 3+5 ?LUE Deficits / Details: requires active assist for approx 45 degress shoulder flexion, PROM for rest of range, 2+/5 shoulder strength, grossly functional elbow ROM with 4-/5 elbow strength, grossly functional forearm ROM wiht 3+/5 strength, exhibits wrist extension not allowing flexion past neutral due to pain/tenderness, grossly functional finger ROM. ?LUE Sensation:  (no apparent sensation deficits) ?LUE Coordination: decreased fine motor (decreased ability to perform finger to thumb but can grossly touch thumb to digies 2-4 - mid finger) ?  ?Lower Extremity Assessment ?Lower Extremity Assessment: Defer to PT evaluation ?  ?  ?  ?Communication Communication ?Communication: Expressive difficulties ?  ?Cognition Arousal/Alertness: Awake/alert ?Behavior  During Therapy: Flat affect ?Overall Cognitive Status: Impaired/Different from baseline ?Area of Impairment: Attention, Orientation, Memory, Following commands, Safety/judgement, Awareness, Problem solving ?  ?  ?  ?  ?  ?  ?  ?  ?Orientation Level: Disoriented to, Place, Time, Situation ?Current Attention Level: Focused ?Memory: Decreased recall of precautions, Decreased short-term memory ?Following Commands: Follows one step commands inconsistently ?Safety/Judgement: Decreased awareness of safety, Decreased awareness of deficits ?Awareness: Intellectual ?Problem Solving: Slow processing, Decreased initiation, Difficulty sequencing, Requires verbal cues, Requires tactile cues ?General Comments: pt persevearting on her family repeating 1 phrase with different names "I know Thomas"..."I know Yvette". pt with flat affect and multimodal cues needed for pt to follow commands. Pt able to state that hurt with wrist flexion/extension ROM and reflex testing. ?  ?  ?General Comments    ? ?  ?Exercises Other Exercises ?Other Exercises: In supine: bilateral self active assist shoulder flexion with elbow extension - given target to reach for - x 5. increased time, encouragement and min assist from therapist. ?Other Exercises: Bending knees and maintaining adduction and midline position ?  ?Shoulder Instructions    ? ? ?Home Living Family/patient expects to be discharged to:: Private residence ([place]) ?Living Arrangements: Spouse/significant other ?Available Help at Discharge: Available 24 hours/day;Family ?Type of Home: House (townhouse) ?Home Access: Stairs to enter ?Entrance Stairs-Number of Steps: 1/2 step/transition at door ?Entrance Stairs-Rails: None ?Home Layout: Able to live on main level with bedroom/bathroom;Two level ?  ?  ?Bathroom  Shower/Tub: Walk-in shower (with a curb) ?  ?Bathroom Toilet: Standard ?Bathroom Accessibility: No ?  ?Home Equipment: BSC/3in1;Rolling Walker (2 wheels);Shower seat ?  ?Additional  Comments: pt's family is hoping to take her home, pt has had significant change however and family will need to be able to provide 24/7 Max Assist ?  ? ?  ?Prior Functioning/Environment Prior Level of Function : N

## 2021-10-18 NOTE — Progress Notes (Signed)
PT Cancellation Note ? ?Patient Details ?Name: Debra Huber ?MRN: [id number] ?DOB: 1950/10/27 ? ? ?Cancelled Treatment:    Reason Eval/Treat Not Completed: Other (comment) (pt recieved enema, will hold and follow up at later date/time as schedule allows and pt able to participate.) ? ? ?Gwynneth Albright PT, DPT ?Acute Rehabilitation Services ?Office [phone] ?Pager [phone] ? ?10/18/2021, 1:40 PM ?

## 2021-10-19 DIAGNOSIS — E538 Deficiency of other specified B group vitamins: Secondary | ICD-10-CM | POA: Diagnosis not present

## 2021-10-19 DIAGNOSIS — K5900 Constipation, unspecified: Secondary | ICD-10-CM | POA: Diagnosis not present

## 2021-10-19 DIAGNOSIS — F0390 Unspecified dementia without behavioral disturbance: Secondary | ICD-10-CM | POA: Diagnosis not present

## 2021-10-19 DIAGNOSIS — E876 Hypokalemia: Secondary | ICD-10-CM | POA: Diagnosis not present

## 2021-10-19 LAB — CBC
HCT: 28.9 % — ABNORMAL LOW (ref 36.0–46.0)
Hemoglobin: 9.3 g/dL — ABNORMAL LOW (ref 12.0–15.0)
MCH: 26.1 pg (ref 26.0–34.0)
MCHC: 32.2 g/dL (ref 30.0–36.0)
MCV: 81 fL (ref 80.0–100.0)
Platelets: 200 10*3/uL (ref 150–400)
RBC: 3.57 MIL/uL — ABNORMAL LOW (ref 3.87–5.11)
RDW: 17.4 % — ABNORMAL HIGH (ref 11.5–15.5)
WBC: 3.6 10*3/uL — ABNORMAL LOW (ref 4.0–10.5)
nRBC: 0 % (ref 0.0–0.2)

## 2021-10-19 MED ORDER — POLYETHYLENE GLYCOL 3350 17 G PO PACK: 17.0000 g | PACK | Freq: Every day | ORAL | 0 refills | Status: DC

## 2021-10-19 MED ORDER — METOPROLOL TARTRATE 25 MG PO TABS
12.5000 mg | ORAL_TABLET | Freq: Two times a day (BID) | ORAL | Status: AC
Start: 2021-10-19 — End: ?

## 2021-10-19 MED ORDER — POLYETHYLENE GLYCOL 3350 17 G PO PACK: 17.0000 g | PACK | Freq: Two times a day (BID) | ORAL | 0 refills | Status: AC

## 2021-10-19 MED ORDER — SODIUM CHLORIDE 0.9 % IV BOLUS
1000.0000 mL | Freq: Once | INTRAVENOUS | Status: AC
  Administered 2021-10-19: 1000 mL via INTRAVENOUS

## 2021-10-19 MED ORDER — POLYETHYLENE GLYCOL 3350 17 G PO PACK
17.0000 g | PACK | Freq: Every day | ORAL | Status: DC
  Administered 2021-10-19: 17 g via ORAL
  Filled 2021-10-19: qty 1

## 2021-10-19 NOTE — Consult Note (Signed)
WOC Nurse Consult Note: ?Patient receiving care in [place]. Primary nurse and NT in room to assist with turning and viewing of the buttocks. ?Reason for Consult: buttock abrasions ?Wound type: I found one tiny, approximately 0.2 cm x 0.1 cm area adjacent to the anus on the left buttock. It was pink in color. ?I also found gelatinous bright red soiling the underpad, the mesh underwear, and on the labia and buttocks. The primary nurse was present at my request to see this also.  She tells me the doctor knows about this. ?Pressure Injury POA: Yes/No/NA ?Measurement: ?Wound bed: ?Drainage (amount, consistency, odor)  ?Periwound: ?Dressing procedure/placement/frequency: ?Apply Criticaid clear to buttocks with each episode of peri-cleaning. ? ?Thank you for the consult.  Discussed plan of care with the patient and bedside nurse.  [facility] nurse will not follow at this time.  Please re-consult the [place] team if needed. ? ?Val Riles, RN, MSN, CWOCN, CNS-BC, pager [phone]  ?

## 2021-10-19 NOTE — TOC Transition Note (Addendum)
Transition of Care (TOC) - CM/SW Discharge Note ? ? ?Patient Details  ?Name: Redith Drach ?MRN: [id number] ?Date of Birth: [birth year] ? ?Transition of Care (TOC) CM/SW Contact:  ?Ross Ludwig, LCSW ?Phone Number: ?10/19/2021, 1:40 PM ? ? ?Clinical Narrative:    ? ?CSW was informed that patient does not want to go to SNF.  Patient would rather go home with home health.  Patient is currently open to [facility], CSW confirmed with Amy they can continue seeing patient.  Per patient's significant other, she will need a hospital bed, and a wheelchair to return back home.  CSW spoke to [facility] and ordered equipment to be delivered to the room today.   ? ?2:15pm  CSW contacted patient's significant other to find out if DME has been delivered yet, per sig other, it has not been delivered yet.  CSW asked him to call CSW once it has been delivered so EMS can be set up for transport. ? ?Patient will need EMS transport back home.  Patient will be going home with home health through [facility] [place].  CSW signing off please reconsult with any other social work needs, home health agency has been notified of planned discharge. ? ?2:50pm  CSW received phone call from patient's sig other, equipment has been delivered.  EMS has been called CSW notified bedside nurse. ? ?Final next level of care: [place] ?Barriers to Discharge: Barriers Resolved ? ? ?Patient Goals and CMS Choice ?Patient states their goals for this hospitalization and ongoing recovery are:: To return back home with home health. ?CMS Medicare.gov Compare Post Acute Care list provided to:: Patient Represenative (must comment) ?Choice offered to / list presented to : Spouse ? ?Discharge Placement ? Home with home health. ?           ?  ?Patient to be transferred to facility by: [facility] ?Name of family member notified: Patient's significant other Marcello Moores ?Patient and family notified of of transfer: 10/19/21 ? ?Discharge Plan and Services ?  ?  ?            ?DME Arranged: Hospital bed, Lightweight manual wheelchair with seat cushion ?DME Agency: [employer] ?Date DME Agency Contacted: 10/19/21 ?Time DME Agency Contacted: 1000 ?Representative spoke with at DME Agency: Andee Poles ?HH Arranged: OT, PT ?[place] Agency: [place] ?Date HH Agency Contacted: 10/19/21 ?Time [place]: [phone] ?Representative spoke with at [place]: [place] ? ?Social Determinants of Health (SDOH) Interventions ?  ? ? ?Readmission Risk Interventions ?   ? View : No data to display.  ?  ?  ?  ? ? ? ? ? ?

## 2021-10-19 NOTE — Discharge Summary (Signed)
Physician Discharge Summary  ?Debra Huber UYQ:[id number] DOB: 27-Nov-1950 DOA: 10/10/2021 ? ?PCP: Willey Blade, MD ? ?Admit date: 10/10/2021 ?Discharge date: 10/19/2021 ? ?Admitted From: Home ?Disposition:  SNF ? ?Recommendations for Outpatient Follow-up:  ?Follow up with PCP in 1-2 weeks ?Please obtain BMP/CBC in one week ? ? ?Home Health:No ?Equipment/Devices:None ? ?Discharge Condition:Stable ?CODE STATUS:Full ?Diet recommendation: Heart Healthy  ? ?Brief/Interim Summary: ?71 y.o. female past medical history significant for dementia, hypothyroidism, essential hypertension brought in by the family for poor oral intake, generalized weakness and weight loss.  Denies any nausea vomiting or abdominal pain CT scan of the abdomen pelvis showed large amount of rectal stool with rectal wall thickening and perirectal stranding, question stercoral colitis, no evidence of obstruction no perianal or abscess. ? ?Discharge Diagnoses:  ?Principal Problem: ?  Hypokalemia ?Active Problems: ?  Hypertension ?  Failure to thrive in adult ?  Hypocalcemia ?  Hypothyroidism ?  Iron deficiency anemia ?  Folate deficiency ?  Constipation ?  Dementia without behavioral disturbance ([place]) ?  Leukopenia ? ?Sepsis probably due to UTI: ?She was started empirically on Rocephin she completed her course of antibiotics in house. ?Physical therapy evaluated the patient they recommended skilled nursing facility. ?Discussed with the family and they would like to take her home with physical therapy. ?He will need hospital bed. ? ?Leukopenia: ?Of unclear etiology, folate was low and was repleted orally she will follow-up with PCP as an outpatient. ? ?Obstipation /Stercoral colitis: ?GI was consulted they did perform a partial bowel prep flex sig was done that showed poorly prepared. ?They recommended to put her on a regimen and have her have regular bowel movements. ? ?History of colon cancer: ?Status post right colectomy colonoscopy was 10  years ago. ? ?Hypokalemia: ?Replete orally now resolved. ? ?Hypocalcemia: ?Corrected for her albumin her calcium is normal. ? ?Microcytic anemia likely due to iron deficiency: ?Status post IV iron hemoglobin has remained relatively stable. ?No signs of overt bleeding. ? ?Hypothyroidism: ?Continue Synthroid. ? ?Discharge Instructions ? ?Discharge Instructions   ? ? Diet - low sodium heart healthy   Complete by: As directed ?  ? Increase activity slowly   Complete by: As directed ?  ? ?  ? ?Allergies as of 10/19/2021   ? ?   Reactions  ? Penicillins Swelling  ? Did PCN reaction causing immediate rash, facial/tongue/throat swelling, SOB or lightheadedness with hypotension: Yes ?Did PCN reaction causing severe rash involving mucus membranes or skin necrosis: unknown ?Did PCN reaction that required hospitalization Yes- was in the hospital and i was given penicillin and had a reaction ?Did PCN reaction occurring within the last 10 years: No, about 15 years or more ?If all of the above answers are "NO", then may proceed with Cephalosporin use.  ? ?  ? ?  ?Medication List  ?  ? ?TAKE these medications   ? ?amitriptyline 25 MG tablet ?Commonly known as: ELAVIL ?Take 1 tablet (25 mg total) by mouth at bedtime. ?  ?amLODipine 5 MG tablet ?Commonly known as: NORVASC ?Take 1 tablet (5 mg total) by mouth daily. ?  ?aspirin EC 81 MG tablet ?Take 81 mg by mouth in the morning. Swallow whole. ?  ?cloNIDine 0.1 MG tablet ?Commonly known as: CATAPRES ?Take 0.1 mg by mouth 2 (two) times daily. ?  ?cloNIDine 0.1 MG tablet ?Commonly known as: CATAPRES ?Take 1 tablet (0.1 mg total) by mouth 2 (two) times daily. ?  ?donepezil 10 MG tablet ?Commonly known  as: ARICEPT ?Take 10 mg by mouth every evening. ?  ?levothyroxine 112 MCG tablet ?Commonly known as: SYNTHROID ?Take 112 mcg by mouth daily before breakfast. ?What changed: Another medication with the same name was removed. Continue taking this medication, and follow the directions you see  here. ?  ?metoprolol tartrate 25 MG tablet ?Commonly known as: LOPRESSOR ?Take 0.5 tablets (12.5 mg total) by mouth 2 (two) times daily. ?  ?mirtazapine 15 MG tablet ?Commonly known as: REMERON ?Take 15 mg by mouth at bedtime. ?  ?NP Thyroid 90 MG tablet ?Generic drug: thyroid ?Take 90 mg by mouth daily before breakfast. ?  ?polyethylene glycol 17 g packet ?Commonly known as: MIRALAX / GLYCOLAX ?Take 17 g by mouth 2 (two) times daily. ?  ?Resource 2.0 Liqd ?Take 120 mLs by mouth in the morning, at noon, and at bedtime. ?  ?sodium chloride 0.9 % infusion ?Inject into the vein as directed. Use 100 ml/hr intravenously X 24 Hours for Dehydration for 2 Days. Give 500 ml bolus, followed by 100 ml/hr x 2 L ?  ?valsartan-hydrochlorothiazide 320-25 MG tablet ?Commonly known as: DIOVAN-HCT ?Take 1 tablet by mouth daily in the afternoon. ?  ? ?  ? ?  ?  ? ? ?  ?Durable Medical Equipment  ?(From admission, onward)  ?  ? ? ?  ? ?  Start     Ordered  ? 10/19/21 0853  For home use only DME wheelchair cushion (seat and back)  Once       ? 10/19/21 0852  ? 10/19/21 0853  For home use only DME 4 wheeled rolling walker with seat  Once       ?Question:  Patient needs a walker to treat with the following condition  Answer:  Moderate dementia without behavioral disturbance ([place])  ? 10/19/21 4403  ? 10/19/21 0853  For home use only DME Hospital bed  Once       ?Question Answer Comment  ?Length of Need Lifetime   ?Bed type Semi-electric   ?  ? 10/19/21 0852  ? ?  ?  ? ?  ? ? ?Allergies  ?Allergen Reactions  ? Penicillins Swelling  ?  Did PCN reaction causing immediate rash, facial/tongue/throat swelling, SOB or lightheadedness with hypotension: Yes ?Did PCN reaction causing severe rash involving mucus membranes or skin necrosis: unknown ?Did PCN reaction that required hospitalization Yes- was in the hospital and i was given penicillin and had a reaction ?Did PCN reaction occurring within the last 10 years: No, about 15 years or more ?If  all of the above answers are "NO", then may proceed with Cephalosporin use. ?  ? ? ?Consultations: ?[facility] gastroenterology ?Procedures/Studies: ?CT HEAD WO CONTRAST (5MM) ? ?Result Date: 10/16/2021 ?CLINICAL DATA:  Altered mental status EXAM: CT HEAD WITHOUT CONTRAST TECHNIQUE: Contiguous axial images were obtained from the base of the skull through the vertex without intravenous contrast. RADIATION DOSE REDUCTION: This exam was performed according to the departmental dose-optimization program which includes automated exposure control, adjustment of the mA and/or kV according to patient size and/or use of iterative reconstruction technique. COMPARISON:  Head CT dated October 07, 2021 FINDINGS: Brain: Chronic white matter ischemic change. No evidence of acute infarction, hemorrhage, hydrocephalus, extra-axial collection or mass lesion/mass effect. Vascular: No hyperdense vessel or unexpected calcification. Skull: Normal. Negative for fracture or focal lesion. Sinuses/Orbits: No acute finding. Other: None. IMPRESSION: No acute intracranial abnormality. Electronically Signed   By: Yetta Glassman M.D.   On: 10/16/2021  12:15  ? ?CT Head Wo Contrast ? ?Result Date: 10/07/2021 ?CLINICAL DATA:  Dizziness, persistent/recurrent, cardiac or vascular cause suspected; Ataxia, cervical trauma EXAM: CT HEAD WITHOUT CONTRAST CT CERVICAL SPINE WITHOUT CONTRAST TECHNIQUE: Multidetector CT imaging of the head and cervical spine was performed following the standard protocol without intravenous contrast. Multiplanar CT image reconstructions of the cervical spine were also generated. RADIATION DOSE REDUCTION: This exam was performed according to the departmental dose-optimization program which includes automated exposure control, adjustment of the mA and/or kV according to patient size and/or use of iterative reconstruction technique. COMPARISON:  CT head Oct 24, 2013. FINDINGS: CT HEAD FINDINGS Brain: No evidence of acute infarction,  hemorrhage, hydrocephalus, extra-axial collection or mass lesion/mass effect. 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 Vascular: No hyperdense vessel

## 2021-10-19 NOTE — TOC CM/SW Note (Addendum)
Patient has _Malignant neoplasm of colon [facility]), thyroid cancer and aspiration risk, which requires patient  to be positioned in ways not feasible with a normal bed. Head must be elevated at least 30 degrees or more or patient will be in excruciating pain and may aspirate. ? ? ?Patient suffers from failure to thrive and dementia which impairs their ability to perform daily activities like walking, toileting, feeding, dressing, and bathing, and ambulating in the home.  A rolling walker will not resolve issue with performing activities of daily living. A wheelchair will allow patient to safely perform daily activities. ?Patient is not able to propel themselves in the home using a standard weight wheelchair due to weakness and  confusion. Patient can self propel in the lightweight wheelchair. ?

## 2021-10-19 NOTE — Care Management Important Message (Signed)
Important Message ? ?Patient Details IM Letter placed in Patients room. ?Name: Debra Huber ?MRN: [id number] ?Date of Birth: 03/18/1951 ? ? ?Medicare Important Message Given:  Yes ? ? ? ? ?Kerin Salen ?10/19/2021, 12:00 PM ?

## 2023-04-27 IMAGING — DX DG CHEST 1V PORT
1 series · 1 of 1 positions shown · non-contrast
Comparison: Thursday September, 2013 chest CT

CLINICAL DATA: Weakness

EXAM:
PORTABLE CHEST 1 VIEW

[chest]
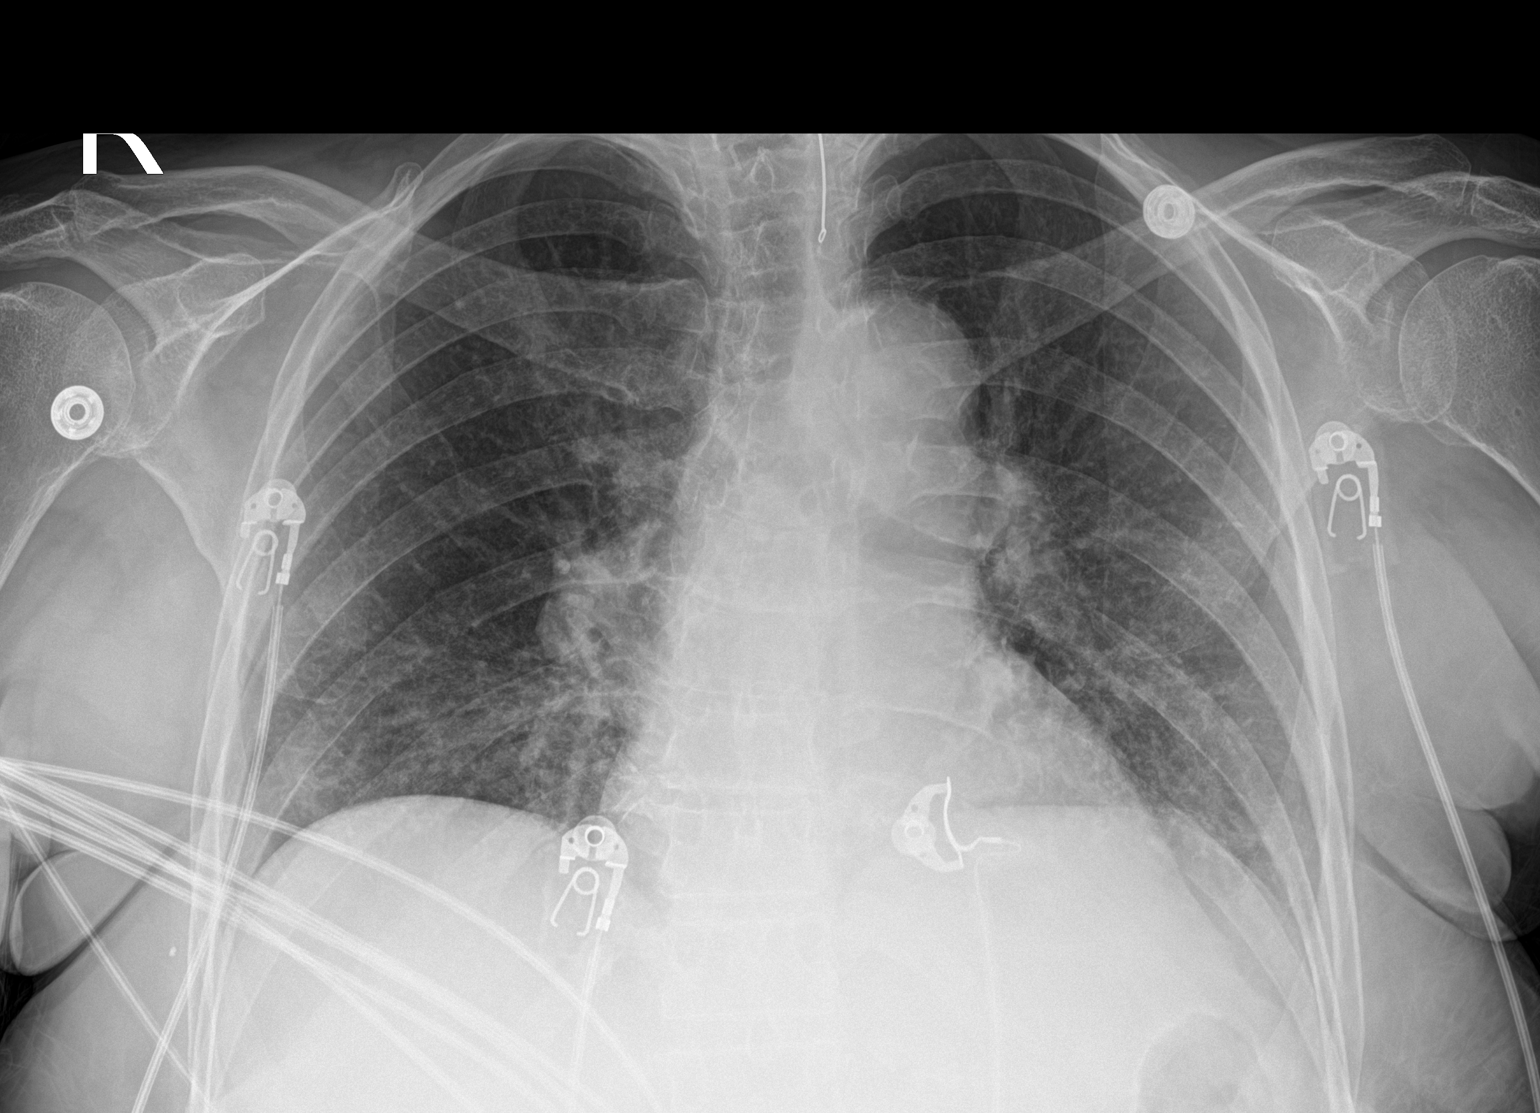

[1 of 1 positions shown; findings below may reference images not displayed]

FINDINGS: EKG leads project over the chest.

A linear metallic density extends from the upper margin of the
radiograph projects over the thoracic inlet. A similar appearing
structure is seen along the LEFT lateral neck. A necklace can be
seen on the scout radiograph for the patient's recent CT of the
head.

Cardiomediastinal contours and hilar structures are unremarkable. No
lobar consolidation. No sign of pleural effusion. Subtle increased
interstitial prominence at the lung bases bilaterally.

No visible pneumothorax.

On limited assessment there is no acute skeletal process.
IMPRESSION: 1. Subtle increased interstitial prominence at the lung bases
bilaterally, may represent mild edema or atypical infection.
2. No lobar consolidation or pleural effusion.
3. Metallic structure projecting over the thoracic inlet just along
the LEFT border of the spine may be a part of the patient's
necklace. Correlate with any indwelling devices such as temperature
probe in the esophagus.

## 2023-04-27 IMAGING — CT CT HEAD W/O CM
3 of 4 series · 15 of 47 positions shown, 18 images · non-contrast
Comparison: 01/04/2018

CLINICAL DATA: Mental status change, unknown cause



[Series 4: head 2.0 h70h · axial · 0.43mm/px · z∈[-71,+57]mm · 9 of 82 slices shown, 12 images]
[im 9/82  brain]
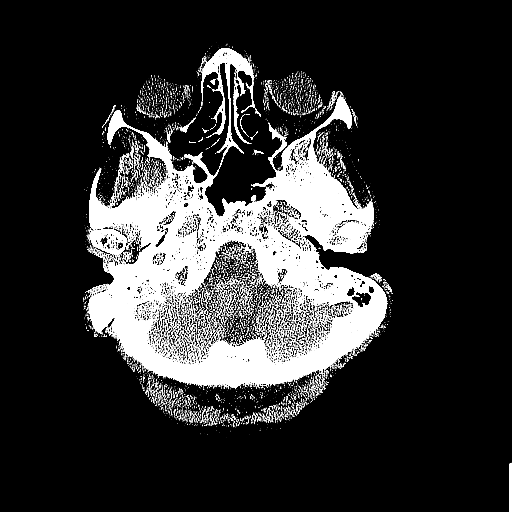
[im 9/82  bone]
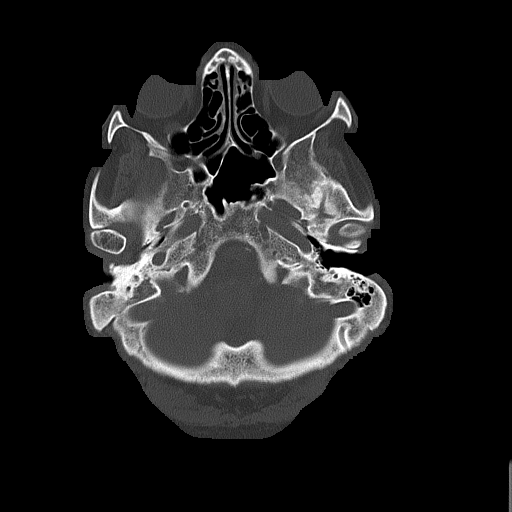
[im 17/82  brain]
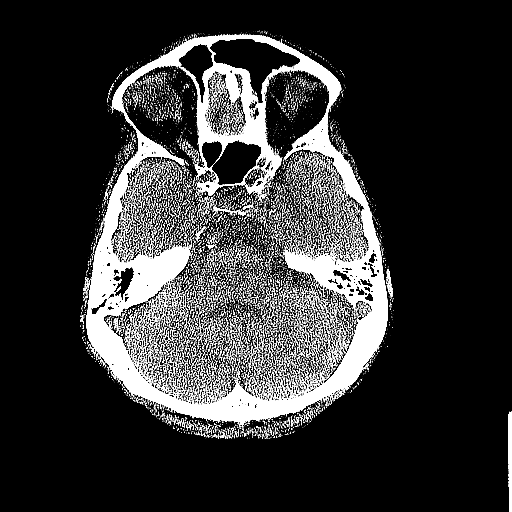
[im 25/82  brain]
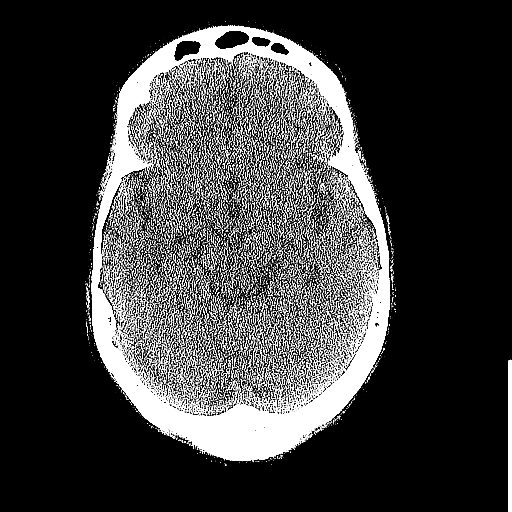
[im 33/82  brain]
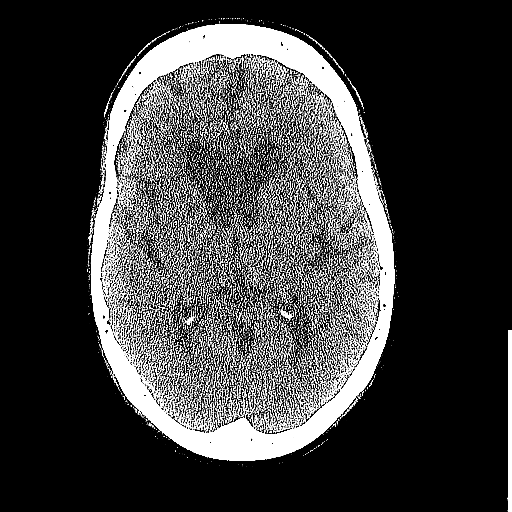
[im 41/82  brain]
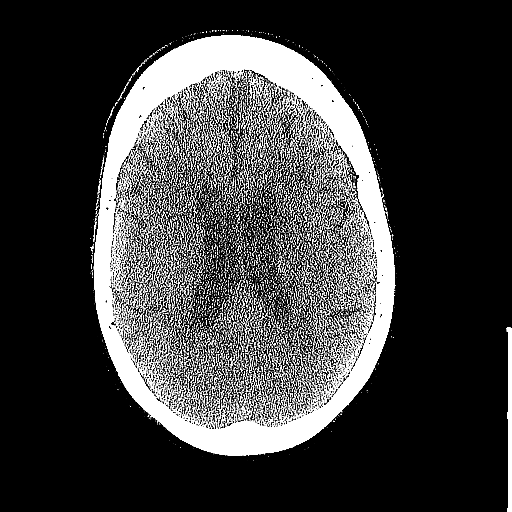
[im 41/82  bone]
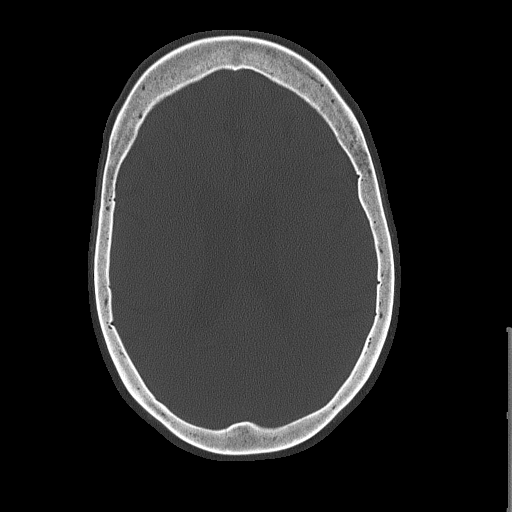
[im 49/82  brain]
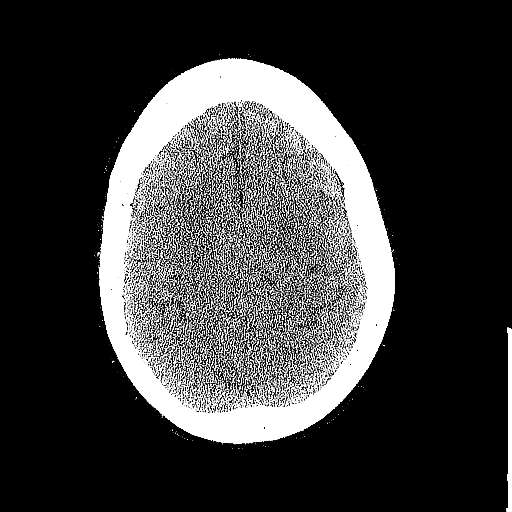
[im 57/82  brain]
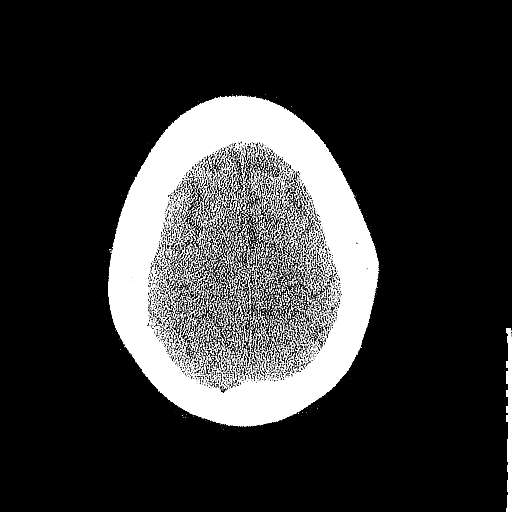
[im 65/82  brain]
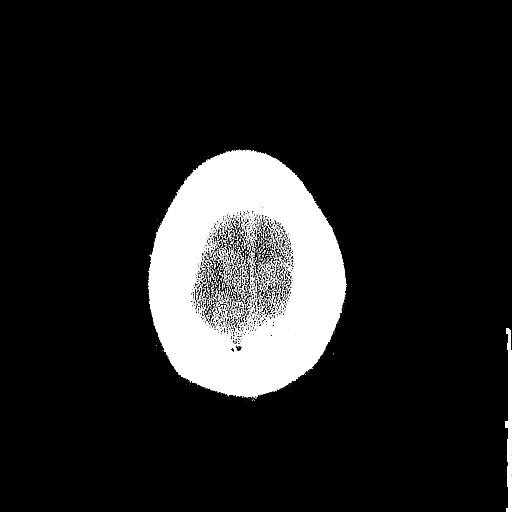
[im 73/82  brain]
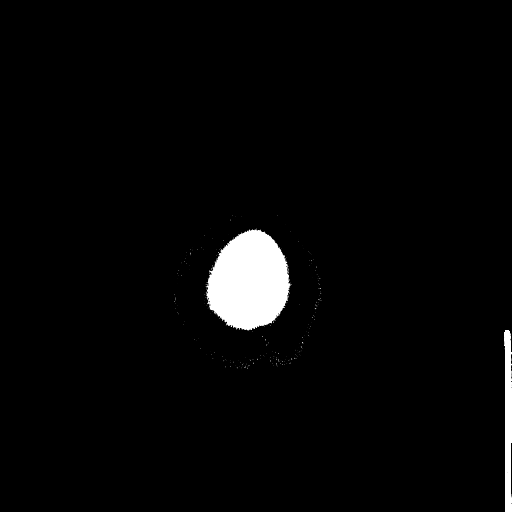
[im 73/82  bone]
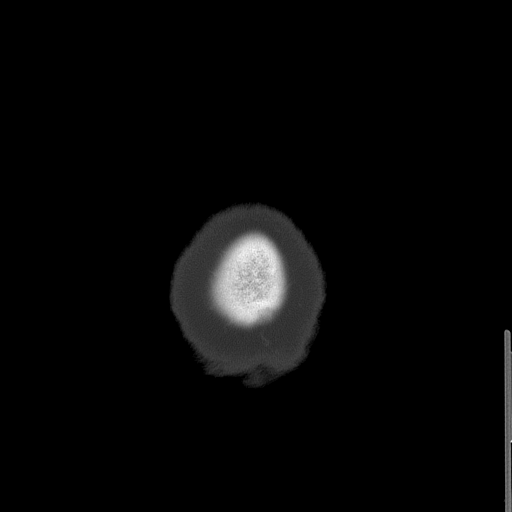

[Series 5: head 3.0 mpr cor · coronal · 0.31mm/px · 3 of 67 slices shown]
[im 23/67  brain]
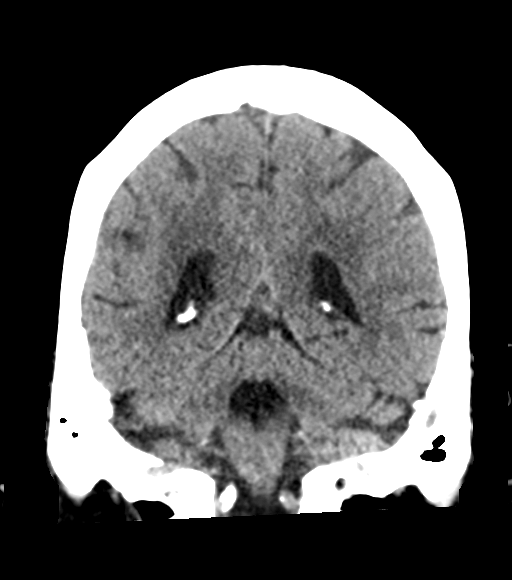
[im 30/67  brain]
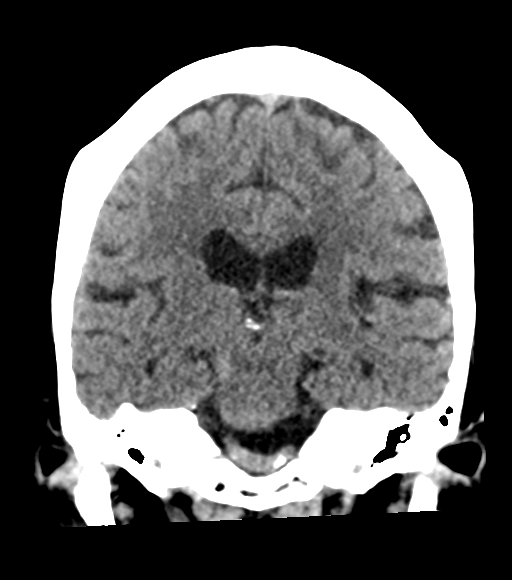
[im 37/67  brain]
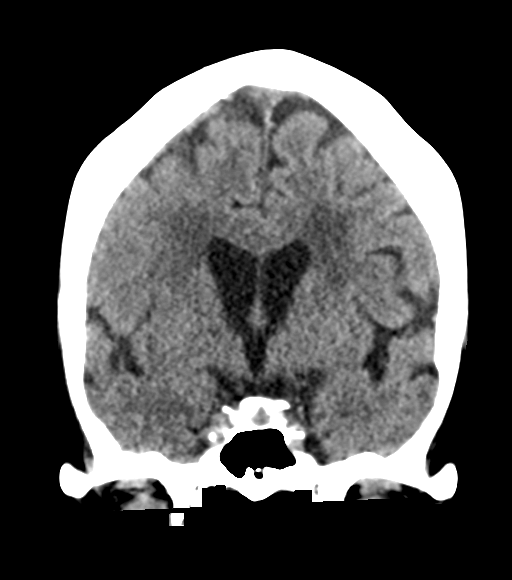

[Series 6: head 3.0 mpr sag · sagittal · 0.32mm/px · 3 of 56 slices shown]
[im 19/56  brain]
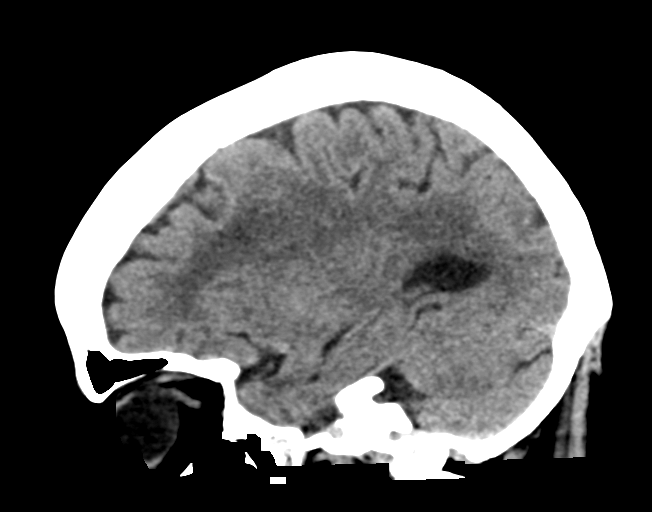
[im 28/56  brain]
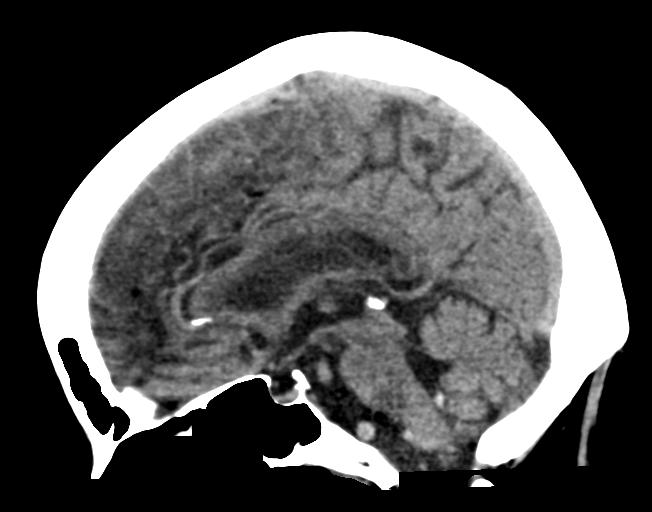
[im 37/56  brain]
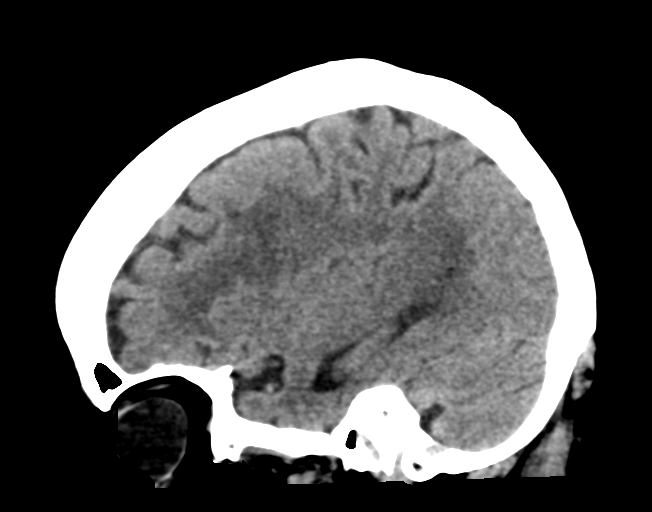

[15 of 47 positions shown; findings below may reference images not displayed]

FINDINGS: Brain: No evidence of acute infarction, hemorrhage, hydrocephalus,
extra-axial collection or mass lesion/mass effect. Chronic bilateral
basal ganglia lacunar infarcts. Moderate low-density changes within
the periventricular and subcortical white matter compatible with
chronic microvascular ischemic change. Mild diffuse cerebral volume
loss.

Vascular: Atherosclerotic calcifications involving the large vessels
of the skull base. No unexpected hyperdense vessel.

Skull: Normal. Negative for fracture or focal lesion.

Sinuses/Orbits: No acute finding.

Other: None.
IMPRESSION: 1. No acute intracranial findings.
2. Chronic microvascular ischemic change and cerebral volume loss.

## 2023-04-28 IMAGING — CR DG CHEST 2V
2 series · 2 of 2 positions shown · non-contrast
Comparison: 08/04/2021 and CT chest 10/05/2013.

CLINICAL DATA: Weakness, fatigue, weight loss.

EXAM:
CHEST - 2 VIEW

[chest lat]
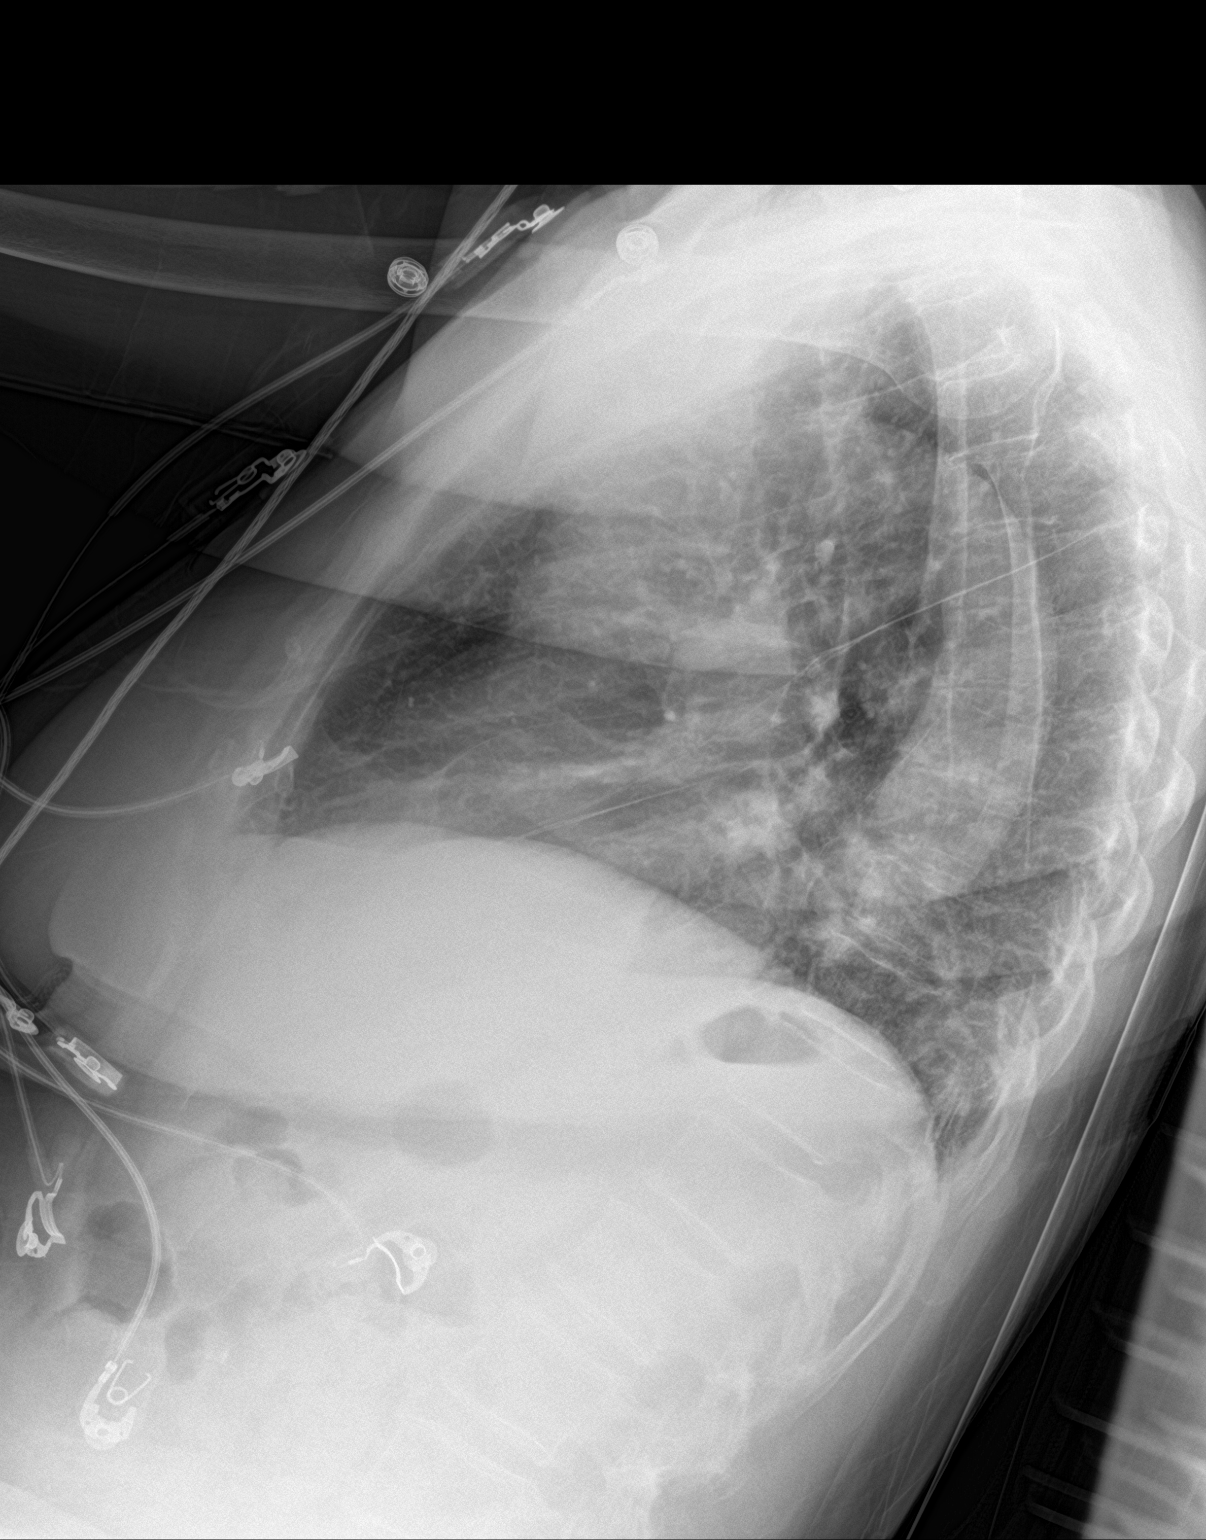

[chest ap]
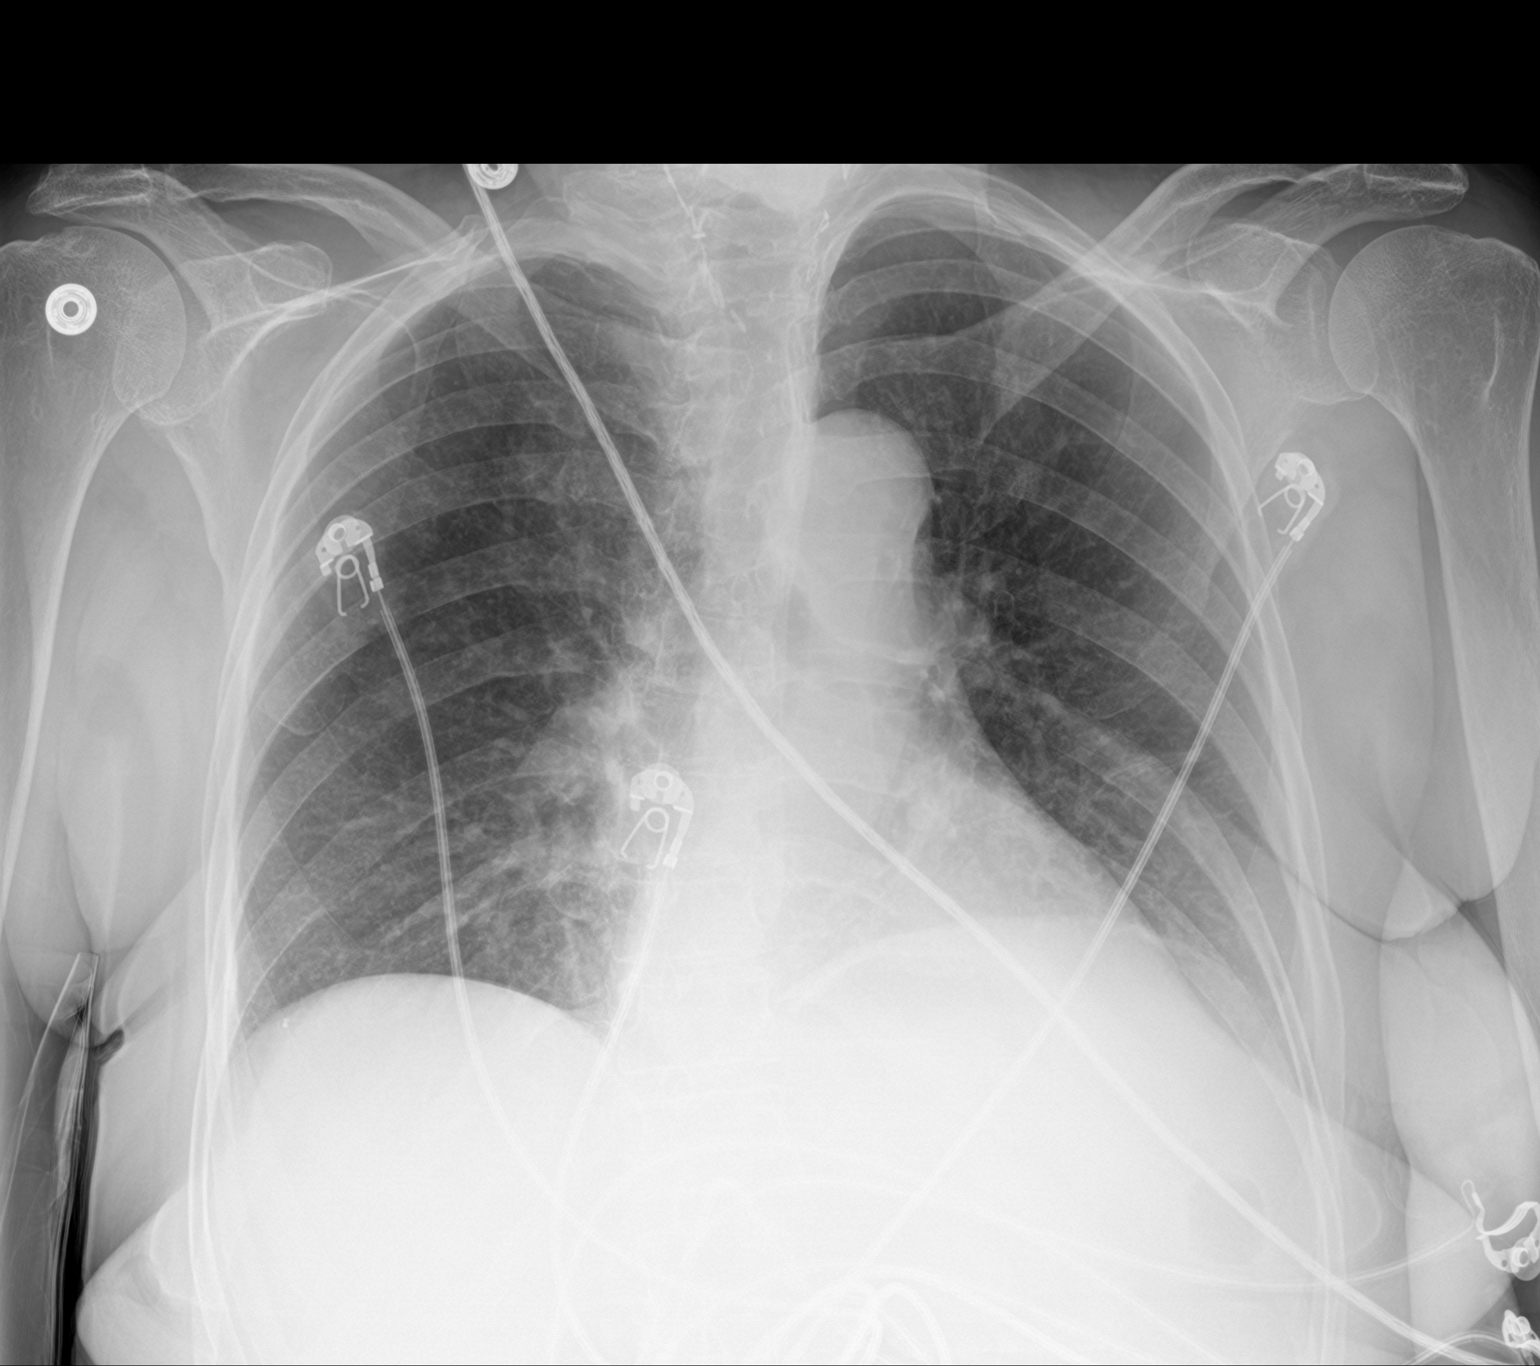

[2 of 2 positions shown; findings below may reference images not displayed]

FINDINGS: Patient is rotated. Trachea is midline. Heart size stable. Lungs are
somewhat low in volume with mild bibasilar airspace opacification.
No pleural fluid. Left hemidiaphragm is minimally elevated.
IMPRESSION: Probable mild bibasilar atelectasis. Difficult to exclude pneumonia.
